# Patient Record
Sex: Male | Born: 1970
Health system: Southern US, Community
[De-identification: ages and names within clinical notes are randomized; demographics above are authoritative.]

## PROBLEM LIST (undated history)

## (undated) DIAGNOSIS — N182 Chronic kidney disease, stage 2 (mild): Secondary | ICD-10-CM

## (undated) DIAGNOSIS — G4733 Obstructive sleep apnea (adult) (pediatric): Secondary | ICD-10-CM

## (undated) DIAGNOSIS — K219 Gastro-esophageal reflux disease without esophagitis: Secondary | ICD-10-CM

## (undated) DIAGNOSIS — M1990 Unspecified osteoarthritis, unspecified site: Secondary | ICD-10-CM

## (undated) DIAGNOSIS — G4730 Sleep apnea, unspecified: Secondary | ICD-10-CM

## (undated) DIAGNOSIS — I517 Cardiomegaly: Secondary | ICD-10-CM

## (undated) DIAGNOSIS — N529 Male erectile dysfunction, unspecified: Secondary | ICD-10-CM

## (undated) DIAGNOSIS — N289 Disorder of kidney and ureter, unspecified: Secondary | ICD-10-CM

## (undated) DIAGNOSIS — E119 Type 2 diabetes mellitus without complications: Secondary | ICD-10-CM

## (undated) DIAGNOSIS — I10 Essential (primary) hypertension: Secondary | ICD-10-CM

## (undated) DIAGNOSIS — R7303 Prediabetes: Secondary | ICD-10-CM

## (undated) DIAGNOSIS — IMO0002 Reserved for concepts with insufficient information to code with codable children: Secondary | ICD-10-CM

## (undated) HISTORY — DX: Reserved for concepts with insufficient information to code with codable children: IMO0002

## (undated) HISTORY — DX: Unspecified osteoarthritis, unspecified site: M19.90

## (undated) HISTORY — DX: Gastro-esophageal reflux disease without esophagitis: K21.9

## (undated) HISTORY — DX: Prediabetes: R73.03

## (undated) HISTORY — DX: Essential (primary) hypertension: I10

## (undated) HISTORY — DX: Chronic kidney disease, stage 2 (mild): N18.2

## (undated) HISTORY — DX: Disorder of kidney and ureter, unspecified: N28.9

## (undated) HISTORY — DX: Cardiomegaly: I51.7

## (undated) HISTORY — DX: Obstructive sleep apnea (adult) (pediatric): G47.33

## (undated) HISTORY — DX: Type 2 diabetes mellitus without complications: E11.9

## (undated) HISTORY — DX: Sleep apnea, unspecified: G47.30

## (undated) HISTORY — PX: OTHER SURGICAL HISTORY: SHX169

## (undated) HISTORY — DX: Male erectile dysfunction, unspecified: N52.9

---

## 1998-07-05 ENCOUNTER — Emergency Department (HOSPITAL_COMMUNITY): Admission: EM | Admit: 1998-07-05 | Discharge: 1998-07-06 | Payer: Self-pay | Admitting: Emergency Medicine

## 1998-09-01 ENCOUNTER — Encounter: Payer: Self-pay | Admitting: Emergency Medicine

## 1998-09-01 ENCOUNTER — Observation Stay (HOSPITAL_COMMUNITY): Admission: EM | Admit: 1998-09-01 | Discharge: 1998-09-02 | Payer: Self-pay | Admitting: Emergency Medicine

## 1998-11-23 ENCOUNTER — Encounter: Payer: Self-pay | Admitting: Internal Medicine

## 1998-11-23 ENCOUNTER — Emergency Department (HOSPITAL_COMMUNITY): Admission: EM | Admit: 1998-11-23 | Discharge: 1998-11-23 | Payer: Self-pay | Admitting: Emergency Medicine

## 1999-03-06 ENCOUNTER — Encounter: Admission: RE | Admit: 1999-03-06 | Discharge: 1999-03-06 | Payer: Self-pay | Admitting: Family Medicine

## 1999-03-06 ENCOUNTER — Encounter: Payer: Self-pay | Admitting: Family Medicine

## 2001-06-04 ENCOUNTER — Emergency Department (HOSPITAL_COMMUNITY): Admission: EM | Admit: 2001-06-04 | Discharge: 2001-06-05 | Payer: Self-pay | Admitting: Emergency Medicine

## 2001-06-04 ENCOUNTER — Encounter: Payer: Self-pay | Admitting: Emergency Medicine

## 2003-01-09 ENCOUNTER — Emergency Department (HOSPITAL_COMMUNITY): Admission: EM | Admit: 2003-01-09 | Discharge: 2003-01-09 | Payer: Self-pay | Admitting: *Deleted

## 2004-04-28 ENCOUNTER — Emergency Department (HOSPITAL_COMMUNITY): Admission: EM | Admit: 2004-04-28 | Discharge: 2004-04-28 | Payer: Self-pay | Admitting: Emergency Medicine

## 2005-02-26 ENCOUNTER — Emergency Department (HOSPITAL_COMMUNITY): Admission: EM | Admit: 2005-02-26 | Discharge: 2005-02-26 | Payer: Self-pay | Admitting: Emergency Medicine

## 2005-04-18 ENCOUNTER — Emergency Department (HOSPITAL_COMMUNITY): Admission: EM | Admit: 2005-04-18 | Discharge: 2005-04-19 | Payer: Self-pay | Admitting: Emergency Medicine

## 2005-06-07 ENCOUNTER — Emergency Department (HOSPITAL_COMMUNITY): Admission: EM | Admit: 2005-06-07 | Discharge: 2005-06-07 | Payer: Self-pay | Admitting: Family Medicine

## 2005-06-13 ENCOUNTER — Emergency Department (HOSPITAL_COMMUNITY): Admission: EM | Admit: 2005-06-13 | Discharge: 2005-06-13 | Payer: Self-pay | Admitting: Emergency Medicine

## 2005-06-25 ENCOUNTER — Emergency Department (HOSPITAL_COMMUNITY): Admission: EM | Admit: 2005-06-25 | Discharge: 2005-06-25 | Payer: Self-pay | Admitting: Emergency Medicine

## 2005-07-03 ENCOUNTER — Ambulatory Visit: Payer: Self-pay | Admitting: *Deleted

## 2005-07-06 ENCOUNTER — Ambulatory Visit: Payer: Self-pay | Admitting: Nurse Practitioner

## 2005-10-11 ENCOUNTER — Emergency Department (HOSPITAL_COMMUNITY): Admission: EM | Admit: 2005-10-11 | Discharge: 2005-10-11 | Payer: Self-pay | Admitting: Family Medicine

## 2005-10-15 ENCOUNTER — Ambulatory Visit: Payer: Self-pay | Admitting: Nurse Practitioner

## 2005-10-22 ENCOUNTER — Ambulatory Visit: Payer: Self-pay | Admitting: Nurse Practitioner

## 2005-11-06 ENCOUNTER — Ambulatory Visit: Payer: Self-pay | Admitting: Nurse Practitioner

## 2005-12-04 ENCOUNTER — Ambulatory Visit: Payer: Self-pay | Admitting: Internal Medicine

## 2006-03-10 ENCOUNTER — Ambulatory Visit: Payer: Self-pay | Admitting: Nurse Practitioner

## 2006-03-28 ENCOUNTER — Emergency Department (HOSPITAL_COMMUNITY): Admission: EM | Admit: 2006-03-28 | Discharge: 2006-03-29 | Payer: Self-pay | Admitting: Emergency Medicine

## 2006-08-20 ENCOUNTER — Emergency Department (HOSPITAL_COMMUNITY): Admission: EM | Admit: 2006-08-20 | Discharge: 2006-08-20 | Payer: Self-pay | Admitting: Family Medicine

## 2006-10-13 ENCOUNTER — Encounter (INDEPENDENT_AMBULATORY_CARE_PROVIDER_SITE_OTHER): Payer: Self-pay | Admitting: *Deleted

## 2006-10-14 ENCOUNTER — Encounter (INDEPENDENT_AMBULATORY_CARE_PROVIDER_SITE_OTHER): Payer: Self-pay | Admitting: Nurse Practitioner

## 2006-10-14 ENCOUNTER — Ambulatory Visit: Payer: Self-pay | Admitting: Internal Medicine

## 2006-10-14 LAB — CONVERTED CEMR LAB
Basophils Absolute: 0 10*3/uL (ref 0.0–0.1)
CO2: 27 meq/L (ref 19–32)
Chloride: 105 meq/L (ref 96–112)
Creatinine, Ser: 1.35 mg/dL (ref 0.40–1.50)
Eosinophils Absolute: 0 10*3/uL (ref 0.0–0.7)
Hemoglobin: 13.6 g/dL (ref 13.0–17.0)
Lymphs Abs: 1.6 10*3/uL (ref 0.7–3.3)
MCHC: 34 g/dL (ref 30.0–36.0)
Monocytes Relative: 7 % (ref 3–11)
Potassium: 4.2 meq/L (ref 3.5–5.3)
RBC: 4.75 M/uL (ref 4.22–5.81)
RDW: 12.4 % (ref 11.5–14.0)
Sodium: 141 meq/L (ref 135–145)
TSH: 1.13 microintl units/mL (ref 0.350–5.50)
WBC: 5 10*3/uL (ref 4.0–10.5)

## 2007-05-20 ENCOUNTER — Emergency Department (HOSPITAL_COMMUNITY): Admission: EM | Admit: 2007-05-20 | Discharge: 2007-05-20 | Payer: Self-pay | Admitting: Family Medicine

## 2007-08-04 ENCOUNTER — Encounter (INDEPENDENT_AMBULATORY_CARE_PROVIDER_SITE_OTHER): Payer: Self-pay | Admitting: Internal Medicine

## 2007-08-04 ENCOUNTER — Inpatient Hospital Stay (HOSPITAL_COMMUNITY): Admission: EM | Admit: 2007-08-04 | Discharge: 2007-08-05 | Payer: Self-pay | Admitting: Emergency Medicine

## 2007-12-05 ENCOUNTER — Emergency Department (HOSPITAL_COMMUNITY): Admission: EM | Admit: 2007-12-05 | Discharge: 2007-12-06 | Payer: Self-pay | Admitting: Emergency Medicine

## 2007-12-06 ENCOUNTER — Emergency Department (HOSPITAL_COMMUNITY): Admission: EM | Admit: 2007-12-06 | Discharge: 2007-12-06 | Payer: Self-pay | Admitting: Emergency Medicine

## 2007-12-08 ENCOUNTER — Ambulatory Visit: Payer: Self-pay | Admitting: Family Medicine

## 2009-05-06 ENCOUNTER — Encounter (HOSPITAL_COMMUNITY): Payer: Self-pay | Admitting: Emergency Medicine

## 2009-05-07 ENCOUNTER — Inpatient Hospital Stay (HOSPITAL_COMMUNITY): Admission: EM | Admit: 2009-05-07 | Discharge: 2009-05-08 | Payer: Self-pay | Admitting: Internal Medicine

## 2009-05-07 HISTORY — PX: TRANSTHORACIC ECHOCARDIOGRAM: SHX275

## 2009-05-08 ENCOUNTER — Encounter (INDEPENDENT_AMBULATORY_CARE_PROVIDER_SITE_OTHER): Payer: Self-pay | Admitting: Internal Medicine

## 2009-05-23 ENCOUNTER — Emergency Department (HOSPITAL_COMMUNITY): Admission: EM | Admit: 2009-05-23 | Discharge: 2009-05-23 | Payer: Self-pay | Admitting: Family Medicine

## 2009-05-23 ENCOUNTER — Emergency Department (HOSPITAL_COMMUNITY): Admission: EM | Admit: 2009-05-23 | Discharge: 2009-05-23 | Payer: Self-pay | Admitting: Emergency Medicine

## 2009-06-06 ENCOUNTER — Emergency Department (HOSPITAL_COMMUNITY): Admission: EM | Admit: 2009-06-06 | Discharge: 2009-06-06 | Payer: Self-pay | Admitting: Emergency Medicine

## 2010-01-19 ENCOUNTER — Emergency Department (HOSPITAL_COMMUNITY)
Admission: EM | Admit: 2010-01-19 | Discharge: 2010-01-20 | Payer: Self-pay | Source: Home / Self Care | Admitting: Emergency Medicine

## 2010-04-07 LAB — POCT I-STAT, CHEM 8
BUN: 15 mg/dL (ref 6–23)
Calcium, Ion: 1.18 mmol/L (ref 1.12–1.32)
Chloride: 102 mEq/L (ref 96–112)
Glucose, Bld: 106 mg/dL — ABNORMAL HIGH (ref 70–99)
HCT: 41 % (ref 39.0–52.0)
Hemoglobin: 13.9 g/dL (ref 13.0–17.0)
Potassium: 4.1 mEq/L (ref 3.5–5.1)
Sodium: 141 mEq/L (ref 135–145)

## 2010-04-16 LAB — BASIC METABOLIC PANEL
BUN: 14 mg/dL (ref 6–23)
CO2: 28 mEq/L (ref 19–32)
Creatinine, Ser: 1.44 mg/dL (ref 0.4–1.5)
GFR calc Af Amer: >60 mL/min (ref >60)
Glucose, Bld: 98 mg/dL (ref 70–99)
Glucose, Bld: 100 mg/dL — ABNORMAL HIGH (ref 70–99)
Potassium: 3.6 mEq/L (ref 3.5–5.1)
Potassium: 3.4 mEq/L — ABNORMAL LOW (ref 3.5–5.1)
Sodium: 139 mEq/L (ref 135–145)

## 2010-04-16 LAB — CK TOTAL AND CKMB (NOT AT ARMC)
CK, MB: 1.3 ng/mL (ref 0.3–4.0)
CK, MB: 2 ng/mL (ref 0.3–4.0)
Relative Index: 0.7 (ref 0.0–2.5)
Total CK: 189 U/L (ref 7–232)

## 2010-04-16 LAB — DIFFERENTIAL
Basophils Relative: 0 % (ref 0–1)
Lymphocytes Relative: 26 % (ref 12–46)
Monocytes Absolute: 0.5 10*3/uL (ref 0.1–1.0)
Monocytes Relative: 9 % (ref 3–12)
Neutrophils Relative %: 64 % (ref 43–77)

## 2010-04-16 LAB — CARDIAC PANEL(CRET KIN+CKTOT+MB+TROPI)
CK, MB: 1.1 ng/mL (ref 0.3–4.0)
Troponin I: 0.02 ng/mL (ref 0.00–0.06)

## 2010-04-16 LAB — LIPID PANEL
LDL Cholesterol: 97 mg/dL (ref 0–99)
Total CHOL/HDL Ratio: 3.9 RATIO
Triglycerides: 74 mg/dL (ref <150)

## 2010-04-16 LAB — CBC
MCV: 86.8 fL (ref 78.0–100.0)
Platelets: 267 10*3/uL (ref 150–400)
RBC: 4.68 MIL/uL (ref 4.22–5.81)

## 2010-04-16 LAB — POCT CARDIAC MARKERS

## 2010-04-16 LAB — T3, FREE: T3, Free: 3 pg/mL (ref 2.3–4.2)

## 2010-04-16 LAB — TSH: TSH: 0.782 u[IU]/mL (ref 0.350–4.500)

## 2010-04-16 LAB — T4, FREE: Free T4: 1.11 ng/dL (ref 0.80–1.80)

## 2010-06-10 NOTE — H&P (Signed)
Nicholas Hughes, CLAIRE NO.:  [id number]   MEDICAL RECORD NO.:  [id number]          PATIENT TYPE:  INP   LOCATION:  4737                         FACILITY:  [facility]   PHYSICIAN:  Herbie Saxon, MDDATE OF BIRTH:  Jun 27, 1970   DATE OF ADMISSION:  08/03/2007  DATE OF DISCHARGE:                              HISTORY & PHYSICAL   PRIMARY CARE PHYSICIAN:  Unassigned.   He is a full code and he has no designated health care power of  attorney.   PRESENTING COMPLAINT:  1. Weakness.  2. Shortness of breath.  3. Palpitations, 2 days.   HISTORY OF PRESENTING COMPLAINT:  This is a 40 year old African American  male with past medical history of hypertension, gastroesophageal reflux  disease, family history of coronary artery disease, was not taking his  blood pressure medications since last 3 weeks.  Was started noticing  shortness of breath on mild exertion associated with palpitations,  extreme weakness, and dizziness since last 2 days.  He denies any  substernal chest pain.  No hemoptysis, no cough, no wheezing, and no  fever.  He does not have any melena or hematemesis.  No syncopal  episode.  The patient does not have hematuria, dysuria, frequency, or  urgency of urination.  No new joint swelling or skin rash.  He denies  frequent heartburn.   PAST MEDICAL HISTORY:  1. Mild renal insufficiency that is followed by nephrologist.  2. Hypertension, 7 years.  3. Acid reflux.   FAMILY HISTORY:  Father had hypertension and coronary artery disease.   SOCIAL HISTORY:  He is married.  Drinks alcohol occasionally.  Denies  any history of tobacco or illicit drug abuse.   REVIEW OF SYSTEMS:  Fourteen-systems are reviewed, pertinent positives  only as the history of presenting complaints.   MEDICATIONS:  1. Hydrochlorothiazide 25 mg daily.  2. Compazine 1 mg at bedtime.  3. Procardia 1 tablet daily.   ALLERGIES:  No known drug allergies.   PHYSICAL EXAMINATION:   GENERAL:  He is a young man not in acute  respiratory distress.  VITAL SIGNS:  Temperature is 98, pulse 67, respiratory rate 16, and  blood pressure 187/122.  Blood pressure on presentation was 224/130.  HEENT:  Pupils are equal, reactive to light and accommodation.  No  pallor or jaundice.  Oropharynx and nasopharynx are clear.  Head is  atraumatic and normocephalic.  NECK:  Supple.  There is no elevated JVD or carotid bruits.  No  thyromegaly.  HEART:  PMI in the fifth intercostal space, anterior to the axillary  line.  There are no rubs, murmurs, or gallops.  CHEST:  Clinically clear.  No rales or rhonchi.  ABDOMEN:  Soft and nontender.  No organomegaly.  Bowel sounds are  normoactive.  Inguinal orifices are patent.  NEUROLOGIC:  He is alert and oriented in time, place, and person.  Cranial nerves are II through XII are intact.  Power is 5 in all limbs.  Deep tendon reflexes 2+ globally.  Peripheral pulses present.  No pedal  edema.   LABORATORY DATA:  Troponin is less than  0.05.  Sodium is 169, potassium  4.3, chloride 102, BUN 15, creatinine 1.4, and glucose is 88.  Hemoglobin is 13.6, hematocrit 40, and D-dimer 0.22.   Chest x-ray shows no acute findings.  Low lung volume.  EKG shows sinus  tachycardia at 53 per minute with left ventricular hypertrophy.   ASSESSMENT:  1. Malignant hypertension, look at the EKG shows acute ST-T changes.  2. Query acute coronary syndrome.  3. Gastroesophageal reflux disease.  4. Query medication compliance.   PLAN:  The patient will be admitted to the PCU.  We will start him on IV  nitroglycerin infusion and IV heparin drip.  Cardiology evaluation in  the morning, if possible cardiac catheterization, if cardiac enzymes  rule in.  Cardiac enzymes are negative, will get an adenosine Myoview  done, positive stress test with aspirin 325 mg daily, Morphine 2 mg IV  q.6 h. p.r.n., metoprolol 50 mg b.i.d., continue hydrochlorothiazide 25  mg daily,  Procardia 60 mg daily, add lisinopril 5 mg daily, Atrovent and  Proventil one unit dose q.6 h. p.r.n. for shortness of breath.   DIET:  Heart-healthy, low cholesterol.   ACTIVITY:  Bedrest.   Oxygen 2-5 L via nasal cannula to keep his sats greater than 90.  Strict  urine input and output  monitoring.  We will obtain his serial cardiac  enzymes and EKG q.8 h. x3.  PT, PTT, INR, BNP, thyroid function test,  and fasting lipid panel was obtained.  Urinalysis, urine toxicology,  Hemoccult, and 2-D echocardiogram.  His illness, medications, treatment  and plan explained to him and he verbalizes his understanding.      Herbie Saxon, MD  Electronically Signed     MIO/MEDQ  D:  08/04/2007  T:  08/04/2007  Job:  [id number]

## 2010-06-10 NOTE — Discharge Summary (Signed)
Nicholas Hughes, TUGWELL NO.:  [id number]   MEDICAL RECORD NO.:  [id number]          PATIENT TYPE:  INP   LOCATION:  4737                         FACILITY:  [facility]   PHYSICIAN:  Isidor Holts, M.D.  DATE OF BIRTH:  05-22-1970   DATE OF ADMISSION:  08/03/2007  DATE OF DISCHARGE:  08/05/2007                               DISCHARGE SUMMARY   PRIMARY MEDICAL DOCTOR:  Gentry Fitz.   DISCHARGE DIAGNOSES:  1. Severe uncontrolled hypertension/hypertensive urgency.  2. Gastroesophageal reflux disease.  3. Mild renal insufficiency.   DISCHARGE MEDICATIONS:  1. Aspirin 81 mg p.o. daily.  2. Lopressor 50 mg p.o. b.i.d.  3. Hydrochlorothiazide 25 mg p.o. daily.  4. Lisinopril 20 mg p.o. daily.  5. Felodipine ER 10 mg p.o. daily.   PROCEDURES:  1. Chest x-ray dated August 03, 2007.  This showed low lung volumes no      acute findings.  2. 2-D echocardiogram dated August 04, 2007.  This showed overall normal      left ventricular systolic function, EF 50% to 55%.  No diagnostic      evidence of left ventricular wall motion abnormalities.  Left      ventricular wall thickness was at the upper limits of normal.      Doppler parameters were consistent with abnormal left ventricular      relaxation.   CONSULTATIONS:  Dr. Lady Deutscher, [facility].   ADMISSION HISTORY:  As in H&P notes of August 03, 2007 dictated by Dr.  Christella Noa.  However, in brief, this is a 40 year old male, with known  history of mild renal insufficiency, hypertension, GERD, presenting with  increased weakness, shortness of breath and palpitations of 2 days  duration.  On initial evaluation in the emergency department he was  found with a markedly elevated BP of 224/130.  He was admitted for  further evaluation, investigation and management.   HOSPITAL COURSE:  1. Severe uncontrolled hypertension/hypertensive urgency.  For details      of this presentation, refer to admission history above.  The  patient was managed with a combination of intravenous infusion of      Nitroglycerin, calcium channel antagonist, beta blocker, and ACE      inhibitor, with satisfactory clinical response.  By August 04, 2007,      we were able to discontinue intravenous Nitroglycerin, and the      patient remained normotensive thereafter.  There had been some      concern of possible acute coronary syndrome.  However, the      patient's cardiac enzymes remained unelevated.  A 12-lead EKG      showed left ventricular hypertrophy.  However, no acute ischemic      findings.  2-D echocardiogram showed no regional wall motion      abnormalities.  Cardiology consultation was kindly provided by Dr.      Lady Deutscher, who made appropriate recommendations, concluded that      the patient has diastolic dysfunction, and has recommended followup      on an outpatient basis in his office.   1. Mild renal insufficiency.  The patient has a known history of mild      renal insufficiency.  At the time of discharge on August 05, 2007,      creatinine was reasonable at 1.50 with a BUN of 21.   1. GERD.  The patient remained asymptomatic from this viewpoint,      during the course of his hospitalization.   DISPOSITION:  On August 05, 2007, the patient was completely asymptomatic  and very keen to be discharged.  Blood pressure had normalized, and as a  matter of fact, was 135/96 mmHg.  The patient was therefore, discharged  accordingly.  Of note, his TSH was normal at 1.456.  His lipid profile  was also normal with a total cholesterol of 145, triglyceride 51, HDL of  48 and LDL 87.  The patient has been reassured accordingly.  He is  recommended to return to regular duties on August 08, 2007.   DIET:  Heart-healthy.   ACTIVITY:  As tolerated.   FOLLOWUP INSTRUCTIONS:  The patient is recommended to follow up with Dr.  Lady Deutscher, [facility], in 4 weeks' time.  Telephone number  [phone].  The patient has been  instructed to call the office to make an  appointment.      Isidor Holts, M.D.  Electronically Signed     CO/MEDQ  D:  08/05/2007  T:  08/05/2007  Job:  [id number]   cc:   Elmore Guise., M.D.

## 2010-06-10 NOTE — Consult Note (Signed)
Nicholas Hughes, Nicholas Hughes NO.:  [id number]   MEDICAL RECORD NO.:  [id number]          PATIENT TYPE:  INP   LOCATION:  2108                         FACILITY:  [facility]   PHYSICIAN:  Elmore Guise., M.D.DATE OF BIRTH:  08/28/1970   DATE OF CONSULTATION:  08/04/2007  DATE OF DISCHARGE:                                 CONSULTATION   INDICATION FOR CONSULT:  Refractory hypertension, abnormal ECG.   HISTORY OF PRESENT ILLNESS:  Nicholas Hughes is a 40 year old African  American male with past medical history of hypertension,  gastroesophageal reflux disease, mild chronic renal insufficiency who  presents with 2-day history of increased dyspnea on exertion and  palpitations.  The patient reports he ran out of his for felodipine and  hydrochlorothiazide while at home.  He was also on clonidine but was  able to take this as instructed.  On arrival to the emergency room, he  was noted to have blood pressure of 235/120.  The patient started on  nitroglycerin drip as well as his p.o. medications.  He currently feels  much better.  His breathing is at baseline.  His blood pressure has  improved to 150/90 with heart rates in the 70s.  He denies any chest  pain.  No presyncope or syncope.  No illegal drug use.  Otherwise he is  very active at home, exercises a couple of times per week by doing  resistance activities such as push-ups and sit-ups.  Denies any problems  with chest pain with exercise or with moderate walking.  He has had no  fever, chills, nausea, vomiting or diarrhea.   REVIEW OF SYSTEMS:  As per HPI or otherwise negative.  He is currently  awaiting transfer to telemetry floor.   HOME MEDICATIONS:  1. Felodipine 10 mg daily.  2. Hydrochlorothiazide 25 mg daily.  3. Clonidine 0.1 mg daily.   MEDICATIONS HERE IN THE HOSPITAL:  1. Nitroglycerin drip (just discontinued).  2. Lopressor 50 mg twice daily.  3. Prinivil 20 mg daily.  4. Procardia 60 mg daily.  5.  Aspirin 325 mg daily.  6. Protonix 40 mg IV daily.  7. Hydrochlorothiazide 25 mg daily.  8. Heparin drip.   ALLERGIES:  None.   FAMILY HISTORY:  Positive for hypertension.   SOCIAL HISTORY:  He is single, has three children.  No tobacco or  alcohol.   PHYSICAL EXAMINATION:  VITAL SIGNS:  Blood pressure is 152/85, heart  rates ranging 60-80 sinus on telemetry monitor.  Saturating 100% on room  air.  He is afebrile with temperature of 98.2.  GENERAL:  He is a very pleasant young-appearing African American male,  alert and oriented x4.  No acute distress.  HEENT:  Normal.  NECK:  Supple.  No lymphadenopathy, 2+ carotids, no JVD and no bruits.  LUNGS:  Clear.  HEART:  Regular with soft S4 noted.  ABDOMEN:  Soft, nontender, nondistended, no rebound or guarding.  EXTREMITIES:  Warm with 2+ pulses and no edema.   LABORATORY DATA:  His lab work was reviewed and showed a BUN and  creatinine of 15 and  1.4, potassium of 4.3.  Negative cardiac markers.  D-dimer was less than 0.22.  UDS was negative.   Chest x-ray showed no acute cardiopulmonary disease.   ECG shows sinus bradycardia rate of 53 per minute with LVH with  repolarization abnormalities.   IMPRESSION AND PLAN:  1. Hypertension, now with significant improvement.  He is off his      nitroglycerin drip.  I would continue his Lopressor, Prinivil,      hydrochlorothiazide and calcium channel blocker as listed above.  2. The patient has had no chest pain consistent with acute coronary      syndrome.  His enzymes are negative.  He can discontinue heparin      and saline lock his IV.  We will review his echo.  3. The patient is currently awaiting transfer to telemetry floor.  At      this time, I feel he can be up and around the room as tolerated.      No further cardiac recommendations.      Elmore Guise., M.D.  Electronically Signed     TWK/MEDQ  D:  08/04/2007  T:  08/04/2007  Job:  [id number]

## 2010-06-30 ENCOUNTER — Other Ambulatory Visit: Payer: Self-pay | Admitting: *Deleted

## 2010-06-30 MED ORDER — TRIAMTERENE-HCTZ 75-50 MG PO TABS: 1.0000 | ORAL_TABLET | Freq: Every day | ORAL | Status: DC

## 2010-06-30 NOTE — Telephone Encounter (Signed)
Fax received from pharmacy. Refill completed. Jodette Maxamillian Tienda RN  

## 2010-07-04 ENCOUNTER — Emergency Department (HOSPITAL_COMMUNITY): Payer: Self-pay

## 2010-07-04 ENCOUNTER — Emergency Department (HOSPITAL_COMMUNITY)
Admission: EM | Admit: 2010-07-04 | Discharge: 2010-07-04 | Disposition: A | Discharge status: Home / Self Care | Payer: No Typology Code available for payment source | Attending: Emergency Medicine | Admitting: Emergency Medicine

## 2010-07-04 DIAGNOSIS — I10 Essential (primary) hypertension: Secondary | ICD-10-CM | POA: Insufficient documentation

## 2010-07-04 DIAGNOSIS — Y92410 Unspecified street and highway as the place of occurrence of the external cause: Secondary | ICD-10-CM | POA: Insufficient documentation

## 2010-07-04 DIAGNOSIS — IMO0002 Reserved for concepts with insufficient information to code with codable children: Secondary | ICD-10-CM | POA: Insufficient documentation

## 2010-07-17 ENCOUNTER — Telehealth: Payer: Self-pay | Admitting: Cardiovascular Disease

## 2010-07-17 MED ORDER — DILTIAZEM HCL ER COATED BEADS 120 MG PO CP24: 120.0000 mg | ORAL_CAPSULE | Freq: Every day | ORAL | Status: DC

## 2010-07-17 NOTE — Telephone Encounter (Signed)
Called in needing a refill of his prescription of his (the medication begins with a D) at [facility] on [address]. I have pulled the chart.

## 2010-07-17 NOTE — Telephone Encounter (Signed)
Yearly app made, 60 days given

## 2010-09-02 ENCOUNTER — Encounter: Payer: Self-pay | Admitting: Cardiovascular Disease

## 2010-09-09 ENCOUNTER — Encounter: Payer: Self-pay | Admitting: Cardiovascular Disease

## 2010-09-10 ENCOUNTER — Ambulatory Visit (INDEPENDENT_AMBULATORY_CARE_PROVIDER_SITE_OTHER): Payer: Self-pay | Admitting: Cardiovascular Disease

## 2010-09-10 ENCOUNTER — Encounter: Payer: Self-pay | Admitting: Cardiovascular Disease

## 2010-09-10 VITALS — BP 136/104 | HR 56 | Ht 71.0 in | Wt 245.4 lb

## 2010-09-10 DIAGNOSIS — I10 Essential (primary) hypertension: Secondary | ICD-10-CM

## 2010-09-10 DIAGNOSIS — I4891 Unspecified atrial fibrillation: Secondary | ICD-10-CM | POA: Insufficient documentation

## 2010-09-10 DIAGNOSIS — I480 Paroxysmal atrial fibrillation: Secondary | ICD-10-CM

## 2010-09-10 MED ORDER — METOPROLOL TARTRATE 50 MG PO TABS: 50.0000 mg | ORAL_TABLET | Freq: Two times a day (BID) | ORAL | Status: DC

## 2010-09-10 MED ORDER — DILTIAZEM HCL ER COATED BEADS 120 MG PO CP24: 120.0000 mg | ORAL_CAPSULE | Freq: Every day | ORAL | Status: DC

## 2010-09-10 MED ORDER — TRIAMTERENE-HCTZ 75-50 MG PO TABS: 0.5000 | ORAL_TABLET | Freq: Every day | ORAL | Status: DC

## 2010-09-10 MED ORDER — LISINOPRIL 40 MG PO TABS: 40.0000 mg | ORAL_TABLET | Freq: Every day | ORAL | Status: DC

## 2010-09-10 NOTE — Assessment & Plan Note (Signed)
He remains in sinus rhythm. He's not had any recurrent palpitations that would make me suspicious of atrial fibrillation.

## 2010-09-10 NOTE — Assessment & Plan Note (Signed)
Nicholas Hughes feels quite well. He admits to eating Maxalt and in fact ate a lot of salt this morning. I've encouraged him to cut his salt intake way back. Also encouraged him to continue his medicines and to try to get more regular exercise.

## 2010-09-10 NOTE — Progress Notes (Signed)
Jamey Whitt Date of Birth  [birth year] [facility] [address]     [address] [place], [place]  [address] [phone]  Fax  [phone]  History of Present Illness:  Nicholas Hughes is a 40 year old gentleman with a history of hypertension and transient atrial fibrillation.  He has no cardiac complaints. He's been eating a little bit of extra salt.  He's not had any episodes of chest pain or shortness breath. He admits to not getting as much exercise as he would like.  Current Outpatient Prescriptions on File Prior to Visit  Medication Sig Dispense Refill  . diltiazem (CARDIZEM CD) 120 MG 24 hr capsule Take 1 capsule (120 mg total) by mouth daily.  30 capsule  1  . lisinopril (PRINIVIL,ZESTRIL) 40 MG tablet Take 40 mg by mouth daily.        . metoprolol (LOPRESSOR) 50 MG tablet Take 50 mg by mouth 2 (two) times daily.        [name] triamterene-hydrochlorothiazide (MAXZIDE) 75-50 MG per tablet Take 1 tablet by mouth daily.  45 tablet  1  . aspirin 325 MG tablet Take 325 mg by mouth daily.          No Known Allergies  Past Medical History  Diagnosis Date  . Hypertension   . GERD (gastroesophageal reflux disease)   . LVH (left ventricular hypertrophy)   . Transient atrial fibrillation or flutter   . ED (erectile dysfunction)   . Renal insufficiency     Past Surgical History  Procedure Date  . Transthoracic echocardiogram 05/07/2009    EF 60-65%    History  Smoking status  . Never Smoker   Smokeless tobacco  . Not on file    History  Alcohol Use No    Family History  Problem Relation Age of Onset  . Hypertension Father   . Coronary artery disease Father     Reviw of Systems:  Reviewed in the HPI.  All other systems are negative.  Physical Exam: BP 136/104  Pulse 56  Ht 5\' 11"  (1.803 m)  Wt 245 lb 6.4 oz (111.313 kg)  BMI 34.23 kg/m2 The patient is alert and oriented x 3.  The mood and affect are normal.   Skin: warm and  dry.  Color is normal.    HEENT:   the sclera are nonicteric.  The mucous membranes are moist.  The carotids are 2+ without bruits.  There is no thyromegaly.  There is no JVD.    Lungs: clear.  The chest wall is non tender.    Heart: regular rate with a normal S1 and S2.  There are no murmurs, gallops, or rubs. The PMI is not displaced.     Abdomen: good bowel sounds.  There is no guarding or rebound.  There is no hepatosplenomegaly or tenderness.  There are no masses.   Extremities:  no clubbing, cyanosis, or edema.  The legs are without rashes.  The distal pulses are intact.   Neuro:  Cranial nerves II - XII are intact.  Motor and sensory functions are intact.    The gait is normal.  ECG: Sinus brady . LVH  Assessment / Plan:

## 2010-09-11 ENCOUNTER — Encounter: Payer: Self-pay | Admitting: Cardiovascular Disease

## 2010-09-15 ENCOUNTER — Telehealth: Payer: Self-pay | Admitting: Cardiovascular Disease

## 2010-09-15 NOTE — Telephone Encounter (Signed)
All meds were ordered prior, pharmacy called and scripts available. Pt informed

## 2010-09-15 NOTE — Telephone Encounter (Signed)
Has a question regarding his prescription refills that he had requested.

## 2010-10-23 LAB — POCT I-STAT, CHEM 8
Calcium, Ion: 1.23
Chloride: 102
HCT: 40
Hemoglobin: 13.6
Potassium: 4.3

## 2010-10-23 LAB — URINALYSIS, MICROSCOPIC ONLY
Bilirubin Urine: NEGATIVE
Glucose, UA: NEGATIVE
Hgb urine dipstick: NEGATIVE
Ketones, ur: NEGATIVE
Leukocytes, UA: NEGATIVE
Nitrite: NEGATIVE
Protein, ur: NEGATIVE
Specific Gravity, Urine: 1.026
Urobilinogen, UA: 1
pH: 7

## 2010-10-23 LAB — POCT CARDIAC MARKERS
CKMB, poc: 1
CKMB, poc: 1.1
CKMB, poc: 1.5
Myoglobin, poc: 228
Myoglobin, poc: 45.9
Myoglobin, poc: 72.4
Myoglobin, poc: >500
Operator id: 277751
Operator id: 277751
Operator id: 277751
Operator id: 277751
Troponin i, poc: <0.05
Troponin i, poc: <0.05
Troponin i, poc: <0.05
Troponin i, poc: <0.05

## 2010-10-23 LAB — LIPID PANEL
Cholesterol: 145
LDL Cholesterol: 87

## 2010-10-23 LAB — CK TOTAL AND CKMB (NOT AT ARMC)
CK, MB: 1.7
Total CK: 264 — ABNORMAL HIGH

## 2010-10-23 LAB — RAPID URINE DRUG SCREEN, HOSP PERFORMED
Benzodiazepines: NOT DETECTED
Cocaine: NOT DETECTED
Tetrahydrocannabinol: NOT DETECTED

## 2010-10-23 LAB — CARDIAC PANEL(CRET KIN+CKTOT+MB+TROPI)
CK, MB: 1.2
Troponin I: <0.01

## 2010-10-23 LAB — CBC
HCT: 41.9
MCHC: 33.6
MCV: 86.1
Platelets: 237
RDW: 12.6

## 2010-10-23 LAB — BASIC METABOLIC PANEL
CO2: 27
GFR calc non Af Amer: 53 — ABNORMAL LOW
Glucose, Bld: 87
Potassium: 3.4 — ABNORMAL LOW
Sodium: 136

## 2010-10-23 LAB — TROPONIN I: Troponin I: <0.01

## 2010-10-23 LAB — HOMOCYSTEINE: Homocysteine: 8.4

## 2010-10-23 LAB — TSH: TSH: 1.456

## 2010-10-23 LAB — D-DIMER, QUANTITATIVE: D-Dimer, Quant: <0.22

## 2010-10-28 LAB — DIFFERENTIAL
Basophils Absolute: 0.1
Basophils Absolute: 0.1
Basophils Relative: 1
Basophils Relative: 1
Eosinophils Absolute: 0.1
Eosinophils Relative: 1
Lymphocytes Relative: 23
Monocytes Absolute: 0.5
Neutro Abs: 4.4
Neutrophils Relative %: 67

## 2010-10-28 LAB — COMPREHENSIVE METABOLIC PANEL
ALT: 31
AST: 35
Albumin: 3.9
Alkaline Phosphatase: 52
Alkaline Phosphatase: 52
BUN: 18
CO2: 30
Chloride: 101
Chloride: 102
Creatinine, Ser: 1.36
Creatinine, Ser: 1.43
GFR calc Af Amer: >60
GFR calc non Af Amer: 59 — ABNORMAL LOW
Glucose, Bld: 95
Potassium: 3.9
Sodium: 139
Total Bilirubin: 0.8
Total Bilirubin: 1
Total Protein: 7.1

## 2010-10-28 LAB — URINALYSIS, ROUTINE W REFLEX MICROSCOPIC
Hgb urine dipstick: NEGATIVE
Hgb urine dipstick: NEGATIVE
Nitrite: NEGATIVE
Protein, ur: NEGATIVE
Specific Gravity, Urine: 1.017
Specific Gravity, Urine: 1.022
Urobilinogen, UA: 1
Urobilinogen, UA: 1

## 2010-10-28 LAB — LIPASE, BLOOD: Lipase: 28

## 2010-10-28 LAB — CBC
HCT: 38.6 — ABNORMAL LOW
Hemoglobin: 13.4
MCV: 85.4
MCV: 86.9
Platelets: 275
RBC: 4.56
RDW: 13
WBC: 6.5

## 2010-11-05 ENCOUNTER — Telehealth: Payer: Self-pay | Admitting: Cardiovascular Disease

## 2010-11-05 NOTE — Telephone Encounter (Signed)
Tried to contact/ no answer

## 2010-11-05 NOTE — Telephone Encounter (Signed)
Voice mail box not set up/ no answer

## 2010-11-05 NOTE — Telephone Encounter (Signed)
PT CALLING TO MAKE AN APPT FOR SWELLING IN HIS TEMPLE, SAID DR Elease Hashimoto AGREED TO BE HIS PCP?

## 2010-11-05 NOTE — Telephone Encounter (Signed)
Tried to make contact, msg box not set up. Dr Elease Hashimoto isn't doing primary care. Will refer to urgent or pcp care.

## 2011-02-04 ENCOUNTER — Other Ambulatory Visit: Payer: Self-pay | Admitting: Cardiovascular Disease

## 2011-02-04 DIAGNOSIS — I10 Essential (primary) hypertension: Secondary | ICD-10-CM

## 2011-02-04 MED ORDER — DILTIAZEM HCL ER COATED BEADS 120 MG PO CP24: 120.0000 mg | ORAL_CAPSULE | Freq: Every day | ORAL | Status: DC

## 2011-03-09 ENCOUNTER — Encounter: Payer: Self-pay | Admitting: Cardiovascular Disease

## 2011-03-09 ENCOUNTER — Ambulatory Visit (INDEPENDENT_AMBULATORY_CARE_PROVIDER_SITE_OTHER): Payer: Self-pay | Admitting: Cardiovascular Disease

## 2011-03-09 VITALS — BP 140/100 | HR 92 | Ht 71.0 in | Wt 260.8 lb

## 2011-03-09 DIAGNOSIS — I10 Essential (primary) hypertension: Secondary | ICD-10-CM

## 2011-03-09 MED ORDER — TRIAMTERENE-HCTZ 75-50 MG PO TABS: 0.5000 | ORAL_TABLET | Freq: Every day | ORAL | Status: DC

## 2011-03-09 MED ORDER — LISINOPRIL 40 MG PO TABS: 40.0000 mg | ORAL_TABLET | Freq: Every day | ORAL | Status: DC

## 2011-03-09 MED ORDER — TADALAFIL 20 MG PO TABS: 20.0000 mg | ORAL_TABLET | Freq: Every day | ORAL | Status: DC | PRN

## 2011-03-09 MED ORDER — DILTIAZEM HCL ER COATED BEADS 120 MG PO CP24: 120.0000 mg | ORAL_CAPSULE | Freq: Every day | ORAL | Status: DC

## 2011-03-09 MED ORDER — METOPROLOL TARTRATE 50 MG PO TABS: 50.0000 mg | ORAL_TABLET | Freq: Two times a day (BID) | ORAL | Status: DC

## 2011-03-09 NOTE — Patient Instructions (Signed)
Your physician wants you to follow-up in: 6 MONTHS You will receive a reminder letter in the mail two months in advance. If you don't receive a letter, please call our office to schedule the follow-up appointment. 

## 2011-03-09 NOTE — Progress Notes (Signed)
    Nicholas Hughes Date of Birth  Feb 04, 1970 [facility]     [place]  [address]    [address]   [address] [place], [place]  [address]    [place], [place]  [address] [phone]  Fax  [phone]  [phone]  Fax [phone]  Problem list: 1. Hypertension 2. Left ventricular 3. Transient atrial fibrillation  History of Present Illness:  Nicholas Hughes is a 41 yo with a hx of HTN.  He has joined a gym and is trying to work out . He has gained some weight since I last saw him.  He is not working currently but is hoping to get back to working at "[employer]  He has gained 10-20 lbs since his last visit.  Current Outpatient Prescriptions on File Prior to Visit  Medication Sig Dispense Refill  . aspirin 325 MG tablet Take 325 mg by mouth daily.        [name] diltiazem (CARDIZEM CD) 120 MG 24 hr capsule Take 1 capsule (120 mg total) by mouth daily.  90 capsule  3  . lisinopril (PRINIVIL,ZESTRIL) 40 MG tablet Take 1 tablet (40 mg total) by mouth daily.  90 tablet  3  . metoprolol (LOPRESSOR) 50 MG tablet Take 1 tablet (50 mg total) by mouth 2 (two) times daily.  180 tablet  3  . triamterene-hydrochlorothiazide (MAXZIDE) 75-50 MG per tablet Take 0.5 tablets by mouth daily.  45 tablet  1    No Known Allergies  Past Medical History  Diagnosis Date  . Hypertension   . GERD (gastroesophageal reflux disease)   . LVH (left ventricular hypertrophy)   . Transient atrial fibrillation or flutter   . ED (erectile dysfunction)   . Renal insufficiency     Past Surgical History  Procedure Date  . Transthoracic echocardiogram 05/07/2009    EF 60-65%    History  Smoking status  . Never Smoker   Smokeless tobacco  . Not on file    History  Alcohol Use No    Family History  Problem Relation Age of Onset  . Hypertension Father   . Coronary artery disease Father     Reviw of Systems:  Reviewed in the HPI.  All other systems are negative.  Physical  Exam: Blood pressure 162/100, pulse 92, height 5\' 11"  (1.803 m), weight 260 lb 12.8 oz (118.298 kg). General: Well developed, well nourished, in no acute distress.  Head: Normocephalic, atraumatic, sclera non-icteric, mucus membranes are moist,   Neck: Supple. Negative for carotid bruits. JVD not elevated.  Lungs: Clear bilaterally to auscultation without wheezes, rales, or rhonchi. Breathing is unlabored.  Heart: RRR with S1 S2. He has a soft systolic murmur.  Abdomen: Soft, non-tender, non-distended with normoactive bowel sounds. No hepatomegaly. No rebound/guarding. No obvious abdominal masses.  Msk:  Strength and tone appear normal for age.  Extremities: No clubbing or cyanosis. No edema.  Distal pedal pulses are 2+ and equal bilaterally.  Neuro: Alert and oriented X 3. Moves all extremities spontaneously.  Psych:  Responds to questions appropriately with a normal affect.  ECG:   Assessment / Plan:

## 2011-03-09 NOTE — Assessment & Plan Note (Addendum)
Nicholas Hughes feels quite well. He's been trying to exercise bit more. He recently joined the gym. He's begun to cut back his salt intake.  I suspect a lot of his hypertension is due to his weight gain. I've encouraged him to continue with a good low-salt low-fat diet. I've encouraged him to exercise on a daily basis in an effort to lose weight. I'll see him back the office in several months for followup visit.

## 2012-01-28 ENCOUNTER — Emergency Department (HOSPITAL_COMMUNITY): Payer: 59

## 2012-01-28 ENCOUNTER — Encounter (HOSPITAL_COMMUNITY): Payer: Self-pay | Admitting: Emergency Medicine

## 2012-01-28 ENCOUNTER — Emergency Department (HOSPITAL_COMMUNITY)
Admission: EM | Admit: 2012-01-28 | Discharge: 2012-01-29 | Disposition: A | Discharge status: Home / Self Care | Payer: 59 | Attending: Emergency Medicine | Admitting: Emergency Medicine

## 2012-01-28 DIAGNOSIS — I10 Essential (primary) hypertension: Secondary | ICD-10-CM | POA: Insufficient documentation

## 2012-01-28 DIAGNOSIS — Z79899 Other long term (current) drug therapy: Secondary | ICD-10-CM | POA: Insufficient documentation

## 2012-01-28 DIAGNOSIS — M7020 Olecranon bursitis, unspecified elbow: Secondary | ICD-10-CM | POA: Insufficient documentation

## 2012-01-28 DIAGNOSIS — Z8679 Personal history of other diseases of the circulatory system: Secondary | ICD-10-CM | POA: Insufficient documentation

## 2012-01-28 DIAGNOSIS — Z7982 Long term (current) use of aspirin: Secondary | ICD-10-CM | POA: Insufficient documentation

## 2012-01-28 DIAGNOSIS — M7021 Olecranon bursitis, right elbow: Secondary | ICD-10-CM

## 2012-01-28 DIAGNOSIS — Z87448 Personal history of other diseases of urinary system: Secondary | ICD-10-CM | POA: Insufficient documentation

## 2012-01-28 DIAGNOSIS — Z8719 Personal history of other diseases of the digestive system: Secondary | ICD-10-CM | POA: Insufficient documentation

## 2012-01-28 LAB — CBC WITH DIFFERENTIAL/PLATELET
Basophils Absolute: 0 10*3/uL (ref 0.0–0.1)
Eosinophils Relative: 1 % (ref 0–5)
Lymphocytes Relative: 24 % (ref 12–46)
Lymphs Abs: 1.3 10*3/uL (ref 0.7–4.0)
Neutro Abs: 3.5 10*3/uL (ref 1.7–7.7)
Neutrophils Relative %: 64 % (ref 43–77)
Platelets: 239 10*3/uL (ref 150–400)
RBC: 5.17 MIL/uL (ref 4.22–5.81)
RDW: 12.2 % (ref 11.5–15.5)
WBC: 5.4 10*3/uL (ref 4.0–10.5)

## 2012-01-28 NOTE — ED Notes (Addendum)
Patient complaining of swelling in his right elbow; reports that he was at work when swelling occurred, but denies injury.  Denies pain and stiffness in area.  Right elbow notably more swollen than left; feels like a "pocket of fluid".

## 2012-01-29 LAB — COMPREHENSIVE METABOLIC PANEL
ALT: 34 U/L (ref 0–53)
AST: 34 U/L (ref 0–37)
Alkaline Phosphatase: 65 U/L (ref 39–117)
CO2: 30 mEq/L (ref 19–32)
Calcium: 9.8 mg/dL (ref 8.4–10.5)
GFR calc Af Amer: 63 mL/min — ABNORMAL LOW (ref >90)
GFR calc non Af Amer: 54 mL/min — ABNORMAL LOW (ref >90)
Glucose, Bld: 94 mg/dL (ref 70–99)
Potassium: 3.9 mEq/L (ref 3.5–5.1)
Sodium: 136 mEq/L (ref 135–145)

## 2012-01-29 NOTE — ED Provider Notes (Signed)
History     CSN: [id number]  Arrival date & time 01/28/12  2234   First MD Initiated Contact with Patient 01/28/12 2343      Chief Complaint  Patient presents with  . Joint Swelling   HPI  History provided by the patient. Patient is a 42 year old male with history of hypertension and renal insufficiency who presents with concerns for swelling over right elbow area. Patient first began to notice swelling earlier in the day. He reports very mild discomfort without significant pain. Patient recalls slightly bumping elbow in the shower but denies any other incidents of injury or trauma. He states there was some swelling prior to this injury. He denies any weakness or numbness in the hand. Denies having similar symptoms previously. Denies any IV drug use. Denies any fever, chills or sweats. Patient has not used any treatment for symptoms.  Patient works for [employer]. 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 He he will often rests his right arm on the center console and armrest area.   Past Medical History  Diagnosis Date  . Hypertension   . GERD (gastroesophageal reflux disease)   . LVH (left ventricular hypertrophy)   . Transient atrial fibrillation or flutter   . ED (erectile dysfunction)   . Renal insufficiency     Past Surgical History  Procedure Date  . Transthoracic echocardiogram 05/07/2009    EF 60-65%    Family History  Problem Relation Age of Onset  . Hypertension Father   . Coronary artery disease Father     History  Substance Use Topics  . Smoking status: Never Smoker   . Smokeless tobacco: Not on file  . Alcohol Use: No      Review of Systems  Constitutional: Negative for fever and chills.  Neurological: Negative for weakness and numbness.  All other systems reviewed and are negative.    Allergies  Review of patient's allergies indicates no known allergies.  Home Medications   Current Outpatient Rx  Name  Route  Sig   Dispense  Refill  . ASPIRIN 325 MG PO TABS   Oral   Take 325 mg by mouth daily.           [name] DILTIAZEM HCL ER COATED BEADS 120 MG PO CP24   Oral   Take 1 capsule (120 mg total) by mouth daily.   90 capsule   3   . LISINOPRIL 40 MG PO TABS   Oral   Take 1 tablet (40 mg total) by mouth daily.   90 tablet   3   . METOPROLOL TARTRATE 50 MG PO TABS   Oral   Take 1 tablet (50 mg total) by mouth 2 (two) times daily.   180 tablet   3   . TRIAMTERENE-HCTZ 75-50 MG PO TABS   Oral   Take 0.5 tablets by mouth daily.   45 tablet   3     Needs office visit.     BP 179/101  Pulse 68  Temp 99 F (37.2 C) (Oral)  Resp 18  SpO2 99%  Physical Exam  Nursing note and vitals reviewed. Constitutional: He is oriented to person, place, and time. He appears well-developed and well-nourished. No distress.  HENT:  Head: Normocephalic.  Cardiovascular: Normal rate and regular rhythm.   Pulmonary/Chest: Effort normal and breath sounds normal.  Musculoskeletal: Normal range of motion.       Small amounts of soft fluctuant nodular swelling over the right olecranon  process. No significant tenderness to palpation. Normal full range of motion of elbow. No gross deformity of bony structures. Normal distal pulses, grip strength and sensation in hands and fingers. Skin is normal without erythema or induration.  Neurological: He is alert and oriented to person, place, and time.  Skin: Skin is warm.  Psychiatric: He has a normal mood and affect.    ED Course  Procedures  Results for orders placed during the hospital encounter of 01/28/12  CBC WITH DIFFERENTIAL      Component Value Range   WBC 5.4  4.0 - 10.5 K/uL   RBC 5.17  4.22 - 5.81 MIL/uL   Hemoglobin 15.0  13.0 - 17.0 g/dL   HCT 08.6  57.8 - 46.9 %   MCV 83.6  78.0 - 100.0 fL   MCH 29.0  26.0 - 34.0 pg   MCHC 34.7  30.0 - 36.0 g/dL   RDW 62.9  52.8 - 41.3 %   Platelets 239  150 - 400 K/uL   Neutrophils Relative 64  43 - 77 %    Neutro Abs 3.5  1.7 - 7.7 K/uL   Lymphocytes Relative 24  12 - 46 %   Lymphs Abs 1.3  0.7 - 4.0 K/uL   Monocytes Relative 11  3 - 12 %   Monocytes Absolute 0.6  0.1 - 1.0 K/uL   Eosinophils Relative 1  0 - 5 %   Eosinophils Absolute 0.0  0.0 - 0.7 K/uL   Basophils Relative 0  0 - 1 %   Basophils Absolute 0.0  0.0 - 0.1 K/uL  COMPREHENSIVE METABOLIC PANEL      Component Value Range   Sodium 136  135 - 145 mEq/L   Potassium 3.9  3.5 - 5.1 mEq/L   Chloride 97  96 - 112 mEq/L   CO2 30  19 - 32 mEq/L   Glucose, Bld 94  70 - 99 mg/dL   BUN 17  6 - 23 mg/dL   Creatinine, Ser 2.44 (*) 0.50 - 1.35 mg/dL   Calcium 9.8  8.4 - 01.0 mg/dL   Total Protein 8.4 (*) 6.0 - 8.3 g/dL   Albumin 4.1  3.5 - 5.2 g/dL   AST 34  0 - 37 U/L   ALT 34  0 - 53 U/L   Alkaline Phosphatase 65  39 - 117 U/L   Total Bilirubin 0.3  0.3 - 1.2 mg/dL   GFR calc non Af Amer 54 (*) >90 mL/min   GFR calc Af Amer 63 (*) >90 mL/min       Dg Elbow Complete Right  01/28/2012  *RADIOLOGY REPORT*  Clinical Data: Right elbow pain and swelling, with limited range of motion.  RIGHT ELBOW - COMPLETE 3+ VIEW  Comparison: None.  Findings: There is no evidence of fracture or dislocation.  The visualized joint spaces are preserved.  No significant joint effusion is identified.  Mild soft tissue swelling is suggested overlying the olecranon.  IMPRESSION: No evidence of fracture or dislocation.   Original Report Authenticated By: Tonia Ghent, M.D.      1. Olecranon bursitis of right elbow       MDM  Patient seen and evaluated. Patient well-appearing in no acute distress. Patient has full range of motion of right elbow without significant discomfort or pain. No significant pain with palpation. X-rays unremarkable. Exam findings consistent with olecranon bursitis. Patient instructed to use Rice treatment.        Angus Seller,  PA 01/29/12 0235

## 2012-01-29 NOTE — ED Provider Notes (Signed)
Medical screening examination/treatment/procedure(s) were performed by non-physician practitioner and as supervising physician I was immediately available for consultation/collaboration.  Antonisha Waskey R. Viet Kemmerer, MD 01/29/12 0737 

## 2012-03-24 ENCOUNTER — Other Ambulatory Visit: Payer: Self-pay | Admitting: *Deleted

## 2012-03-24 MED ORDER — DILTIAZEM HCL ER COATED BEADS 120 MG PO CP24: 120.0000 mg | ORAL_CAPSULE | Freq: Every day | ORAL | Status: DC

## 2012-03-24 MED ORDER — LISINOPRIL 40 MG PO TABS: 40.0000 mg | ORAL_TABLET | Freq: Every day | ORAL | Status: DC

## 2012-03-24 MED ORDER — TRIAMTERENE-HCTZ 75-50 MG PO TABS: 0.5000 | ORAL_TABLET | Freq: Every day | ORAL | Status: DC

## 2012-03-24 MED ORDER — METOPROLOL TARTRATE 50 MG PO TABS: 50.0000 mg | ORAL_TABLET | Freq: Two times a day (BID) | ORAL | Status: DC

## 2012-03-30 ENCOUNTER — Ambulatory Visit (INDEPENDENT_AMBULATORY_CARE_PROVIDER_SITE_OTHER): Payer: 59 | Admitting: Physician Assistant

## 2012-03-30 ENCOUNTER — Encounter: Payer: Self-pay | Admitting: Physician Assistant

## 2012-03-30 VITALS — BP 138/82 | HR 57 | Ht 72.0 in | Wt 252.8 lb

## 2012-03-30 MED ORDER — TRIAMTERENE-HCTZ 75-50 MG PO TABS: 0.5000 | ORAL_TABLET | Freq: Every day | ORAL | Status: DC

## 2012-03-30 MED ORDER — LISINOPRIL 40 MG PO TABS: 40.0000 mg | ORAL_TABLET | Freq: Every day | ORAL | Status: DC

## 2012-03-30 MED ORDER — DILTIAZEM HCL ER COATED BEADS 120 MG PO CP24: 120.0000 mg | ORAL_CAPSULE | Freq: Every day | ORAL | Status: DC

## 2012-03-30 MED ORDER — METOPROLOL TARTRATE 50 MG PO TABS: 50.0000 mg | ORAL_TABLET | Freq: Two times a day (BID) | ORAL | Status: DC

## 2012-03-30 NOTE — Patient Instructions (Signed)
Your physician wants you to follow-up in:  YEAR WITH DR Prescott Parma will receive a reminder letter in the mail two months in advance. If you don't receive a letter, please call our office to schedule the follow-up appointment. Your physician recommends that you continue on your current medications as directed. Please refer to the Current Medication list given to you today.

## 2012-03-30 NOTE — Assessment & Plan Note (Signed)
Patient is in normal sinus rhythm. He has had no palpitations.

## 2012-03-30 NOTE — Progress Notes (Signed)
HPI:  This is a very pleasant 42 year old African American male patient of Dr. Melburn Popper who has a history of hypertension and transient atrial fibrillation. He is here for his yearly checkup and medication renewal. He's been doing well and taking his medication regularly. He keeps a close eye on his blood pressure at home and it usually runs between 1:30 140/80-85. He watches his salt closely. He walks on treadmill 15-20 minutes every 3 days. He recently got a new primary care physician but can't remember her name or what practice she is in. He denies any chest pain, palpitations, dyspnea, dyspnea on exertion, dizziness, or presyncope.  No Known Allergies  Current Outpatient Prescriptions on File Prior to Visit: aspirin 325 MG tablet, Take 325 mg by mouth daily.  , Disp: , Rfl:  diltiazem (CARDIZEM CD) 120 MG 24 hr capsule, Take 1 capsule (120 mg total) by mouth daily., Disp: 30 capsule, Rfl: 0 lisinopril (PRINIVIL,ZESTRIL) 40 MG tablet, Take 1 tablet (40 mg total) by mouth daily., Disp: 30 tablet, Rfl: 0 metoprolol (LOPRESSOR) 50 MG tablet, Take 1 tablet (50 mg total) by mouth 2 (two) times daily., Disp: 60 tablet, Rfl: 0 triamterene-hydrochlorothiazide (MAXZIDE) 75-50 MG per tablet, Take 0.5 tablets by mouth daily., Disp: 15 tablet, Rfl: 0  No current facility-administered medications on file prior to visit.   Past Medical History:   Hypertension                                                 GERD (gastroesophageal reflux disease)                       LVH (left ventricular hypertrophy)                           Transient atrial fibrillation or flutter                     ED (erectile dysfunction)                                    Renal insufficiency                                         Past Surgical History:   TRANSTHORACIC ECHOCARDIOGRAM                     05/07/2009      Comment:EF 60-65%  Review of patient's family history indicates:   Hypertension                   Father                    Coronary artery disease        Father                   Social History   Marital Status: Single              Spouse Name:                      Years  of Education:                 Number of children:             Occupational History   None on file  Social History Main Topics   Smoking Status: Never Smoker                     Smokeless Status: Not on file                      Alcohol Use: No             Drug Use: No             Sexual Activity:                    Other Topics            Concern   None on file  Social History Narrative   None on file    ROS:See history of present illness otherwise negative   PHYSICAL EXAM: Well-nournished, in no acute distress. Neck: No JVD, HJR, Bruit, or thyroid enlargement  Lungs: No tachypnea, clear without wheezing, rales, or rhonchi  Cardiovascular: RRR, PMI not displaced, heart sounds distant, no murmurs, gallops, bruit, thrill, or heave.  Abdomen: BS normal. Soft without organomegaly, masses, lesions or tenderness.  Extremities: without cyanosis, clubbing or edema. Good distal pulses bilateral  SKin: Warm, no lesions or rashes   Musculoskeletal: No deformities  Neuro: no focal signs  BP 138/82  Pulse 57  Ht 6' (1.829 m)  Wt 252 lb 12.8 oz (114.669 kg)  BMI 34.28 kg/m2   ZOX:WRUEAV sinus rhythm with LVH and subsequent T wave inversion inferior anterolaterally. EKG unchanged from prior tracing

## 2012-03-30 NOTE — Addendum Note (Signed)
Addended by: Scherrie Bateman E on: 03/30/2012 08:54 AM   Modules accepted: Orders

## 2012-03-30 NOTE — Assessment & Plan Note (Signed)
Blood pressure stable. Renew medications. Recommend increasing his exercise to at least 30 minutes 3 times a week.

## 2013-03-30 ENCOUNTER — Other Ambulatory Visit: Payer: Self-pay

## 2013-03-30 DIAGNOSIS — I10 Essential (primary) hypertension: Secondary | ICD-10-CM

## 2013-03-30 MED ORDER — METOPROLOL TARTRATE 50 MG PO TABS: 50.0000 mg | ORAL_TABLET | Freq: Two times a day (BID) | ORAL | Status: DC

## 2013-03-30 MED ORDER — TRIAMTERENE-HCTZ 75-50 MG PO TABS: 0.5000 | ORAL_TABLET | Freq: Every day | ORAL | Status: DC

## 2013-03-30 MED ORDER — DILTIAZEM HCL ER COATED BEADS 120 MG PO CP24: 120.0000 mg | ORAL_CAPSULE | Freq: Every day | ORAL | Status: DC

## 2013-03-30 MED ORDER — LISINOPRIL 40 MG PO TABS: 40.0000 mg | ORAL_TABLET | Freq: Every day | ORAL | Status: DC

## 2013-04-26 ENCOUNTER — Ambulatory Visit: Payer: 59 | Admitting: Cardiovascular Disease

## 2013-04-27 ENCOUNTER — Other Ambulatory Visit: Payer: Self-pay | Admitting: *Deleted

## 2013-04-27 DIAGNOSIS — I10 Essential (primary) hypertension: Secondary | ICD-10-CM

## 2013-04-27 MED ORDER — LISINOPRIL 40 MG PO TABS: 40.0000 mg | ORAL_TABLET | Freq: Every day | ORAL | Status: DC

## 2013-04-27 MED ORDER — METOPROLOL TARTRATE 50 MG PO TABS: 50.0000 mg | ORAL_TABLET | Freq: Two times a day (BID) | ORAL | Status: DC

## 2013-04-27 MED ORDER — TRIAMTERENE-HCTZ 75-50 MG PO TABS: 0.5000 | ORAL_TABLET | Freq: Every day | ORAL | Status: DC

## 2013-04-27 MED ORDER — DILTIAZEM HCL ER COATED BEADS 120 MG PO CP24: 120.0000 mg | ORAL_CAPSULE | Freq: Every day | ORAL | Status: DC

## 2013-05-10 ENCOUNTER — Ambulatory Visit (INDEPENDENT_AMBULATORY_CARE_PROVIDER_SITE_OTHER): Payer: 59 | Admitting: Cardiovascular Disease

## 2013-05-10 ENCOUNTER — Encounter: Payer: Self-pay | Admitting: Cardiovascular Disease

## 2013-05-10 VITALS — BP 126/104 | HR 57 | Ht 72.0 in | Wt 259.4 lb

## 2013-05-10 DIAGNOSIS — I4891 Unspecified atrial fibrillation: Secondary | ICD-10-CM

## 2013-05-10 DIAGNOSIS — I10 Essential (primary) hypertension: Secondary | ICD-10-CM

## 2013-05-10 DIAGNOSIS — I480 Paroxysmal atrial fibrillation: Secondary | ICD-10-CM

## 2013-05-10 MED ORDER — CARVEDILOL 25 MG PO TABS: 25.0000 mg | ORAL_TABLET | Freq: Two times a day (BID) | ORAL | Status: DC

## 2013-05-10 NOTE — Patient Instructions (Addendum)
Your physician has recommended you make the following change in your medication:  STOP Metoprolol (Lopressor) 50 mg START Carvedilol 25 mg twice daily  Your physician wants you to follow-up in: 6 months with Dr. Elease HashimotoNahser.  You will receive a reminder letter in the mail two months in advance. If you don't receive a letter, please call our office to schedule the follow-up appointment.

## 2013-05-10 NOTE — Assessment & Plan Note (Signed)
Nicholas Hughes is doing fine . He's exercising fairly regularly - 15 minutes a day - 3-4 times a week.  Encouraged him to increase his exercise a little bit. He's got sounds fairly well-controlled. We will have him continue to watch his salt intake.  I would like to stop the metoprolol to start him on carvedilol 25 mg twice a day. This may help improve his blood pressure control. I'll see him back in 6 months for followup visit

## 2013-05-10 NOTE — Progress Notes (Signed)
Nicholas Hughes Date of Birth  1970-12-24 [facility]     [place] [facility]  [address]    [address]   [address] [place], [place]  [address]    [place], [place]  [id number] [phone]  Fax  [phone]  [phone]  Fax [phone]  Problem list: 1. Hypertension 2. Left ventricular Hypertrophy 3. Transient atrial fibrillation  History of Present Illness:  Nicholas PontesDellwood is a 43 yo with a hx of HTN.  He has joined a gym and is trying to work out . He has gained some weight since I last saw him.  He is not working currently but is hoping to get back to working at "[employer]  He has gained 10-20 lbs since his last visit.  Nicholas Hughes, Nicholas Hughes:  Nicholas Hughes is doing ok.  No Cp no dyspnea.  BP at home is typically ok.  130 / 85-89 is typical.  Avoiding salt, eats some fast foods.   He is working at [employer].   Current Outpatient Prescriptions on File Prior to Visit  Medication Sig Dispense Refill  . aspirin 325 MG tablet Take 325 mg by mouth daily.        [name]. diltiazem (CARDIZEM CD) 120 MG 24 hr capsule Take 1 capsule (120 mg total) by mouth daily.  Hughes capsule  0  . lisinopril (PRINIVIL,ZESTRIL) 40 MG tablet Take 1 tablet (40 mg total) by mouth daily.  Hughes tablet  0  . metoprolol (LOPRESSOR) 50 MG tablet Take 1 tablet (50 mg total) by mouth 2 (two) times daily.  30 tablet  0  . triamterene-hydrochlorothiazide (MAXZIDE) 75-50 MG per tablet Take 0.5 tablets by mouth daily.  7 tablet  0   No current facility-administered medications on file prior to visit.    No Known Allergies  Past Medical History  Diagnosis Date  . Hypertension   . GERD (gastroesophageal reflux disease)   . LVH (left ventricular hypertrophy)   . Transient atrial fibrillation or flutter   . ED (erectile dysfunction)   . Renal insufficiency     Past Surgical History  Procedure Laterality Date  . Transthoracic echocardiogram  05/07/2009    EF 60-65%    History  Smoking status  .  Never Smoker   Smokeless tobacco  . Not on file    History  Alcohol Use No    Family History  Problem Relation Age of Onset  . Hypertension Father   . Coronary artery disease Father     Reviw of Systems:  Reviewed in the HPI.  All other systems are negative.  Physical Exam: Blood pressure 126/104, pulse 57, height 6' (1.829 m), weight 259 lb 6.4 oz (117.663 kg). General: Well developed, well nourished, in no acute distress. Head: Normocephalic, atraumatic, sclera non-icteric, mucus membranes are moist,  Neck: Supple. Negative for carotid bruits. JVD not elevated. Lungs: Clear bilaterally to auscultation without wheezes, rales, or rhonchi. Breathing is unlabored. Heart: RRR with S1 S2. He has a soft systolic murmur. Abdomen: Soft, non-tender, non-distended with normoactive bowel sounds. No hepatomegaly. No rebound/guarding. No obvious abdominal masses. Msk:  Strength and tone appear normal for age. Extremities: No clubbing or cyanosis. No edema.  Distal pedal pulses are 2+ and equal bilaterally. Neuro: Alert and oriented X 3. Moves all extremities spontaneously. Psych:  Responds to questions appropriately with a normal affect. ECG: Nicholas Hughes, Nicholas Hughes;   SINUS BRADY  At 5257.  Minimal LVH with associated St abn.  Assessment / Plan:

## 2013-05-17 ENCOUNTER — Other Ambulatory Visit: Payer: Self-pay | Admitting: *Deleted

## 2013-05-17 DIAGNOSIS — I10 Essential (primary) hypertension: Secondary | ICD-10-CM

## 2013-05-17 MED ORDER — TRIAMTERENE-HCTZ 75-50 MG PO TABS: 0.5000 | ORAL_TABLET | Freq: Every day | ORAL | Status: DC

## 2013-05-17 MED ORDER — DILTIAZEM HCL ER COATED BEADS 120 MG PO CP24: 120.0000 mg | ORAL_CAPSULE | Freq: Every day | ORAL | Status: DC

## 2013-05-17 MED ORDER — LISINOPRIL 40 MG PO TABS: 40.0000 mg | ORAL_TABLET | Freq: Every day | ORAL | Status: DC

## 2014-01-23 ENCOUNTER — Emergency Department (HOSPITAL_COMMUNITY): Payer: 59

## 2014-01-23 ENCOUNTER — Encounter (HOSPITAL_COMMUNITY): Payer: Self-pay | Admitting: Emergency Medicine

## 2014-01-23 ENCOUNTER — Emergency Department (HOSPITAL_COMMUNITY)
Admission: EM | Admit: 2014-01-23 | Discharge: 2014-01-23 | Disposition: A | Discharge status: Home / Self Care | Payer: 59 | Attending: Emergency Medicine | Admitting: Emergency Medicine

## 2014-01-23 DIAGNOSIS — Z87438 Personal history of other diseases of male genital organs: Secondary | ICD-10-CM | POA: Diagnosis not present

## 2014-01-23 DIAGNOSIS — Z79899 Other long term (current) drug therapy: Secondary | ICD-10-CM | POA: Insufficient documentation

## 2014-01-23 DIAGNOSIS — I10 Essential (primary) hypertension: Secondary | ICD-10-CM | POA: Diagnosis not present

## 2014-01-23 DIAGNOSIS — Y998 Other external cause status: Secondary | ICD-10-CM | POA: Insufficient documentation

## 2014-01-23 DIAGNOSIS — Z87448 Personal history of other diseases of urinary system: Secondary | ICD-10-CM | POA: Insufficient documentation

## 2014-01-23 DIAGNOSIS — S5002XA Contusion of left elbow, initial encounter: Secondary | ICD-10-CM | POA: Diagnosis not present

## 2014-01-23 DIAGNOSIS — W2209XA Striking against other stationary object, initial encounter: Secondary | ICD-10-CM | POA: Diagnosis not present

## 2014-01-23 DIAGNOSIS — Z7982 Long term (current) use of aspirin: Secondary | ICD-10-CM | POA: Diagnosis not present

## 2014-01-23 DIAGNOSIS — Y9389 Activity, other specified: Secondary | ICD-10-CM | POA: Insufficient documentation

## 2014-01-23 DIAGNOSIS — S59902A Unspecified injury of left elbow, initial encounter: Secondary | ICD-10-CM | POA: Diagnosis present

## 2014-01-23 DIAGNOSIS — Z8719 Personal history of other diseases of the digestive system: Secondary | ICD-10-CM | POA: Diagnosis not present

## 2014-01-23 DIAGNOSIS — Y9289 Other specified places as the place of occurrence of the external cause: Secondary | ICD-10-CM | POA: Insufficient documentation

## 2014-01-23 MED ORDER — NAPROXEN 500 MG PO TABS: 500.0000 mg | ORAL_TABLET | Freq: Two times a day (BID) | ORAL | Status: DC

## 2014-01-23 MED ORDER — HYDROCODONE-ACETAMINOPHEN 5-325 MG PO TABS: 1.0000 | ORAL_TABLET | ORAL | Status: DC | PRN

## 2014-01-23 MED ORDER — NAPROXEN 500 MG PO TABS
500.0000 mg | ORAL_TABLET | Freq: Once | ORAL | Status: AC
  Administered 2014-01-23: 500 mg via ORAL
  Filled 2014-01-23: qty 1

## 2014-01-23 NOTE — Discharge Instructions (Signed)
Please follow the directions provided.  Use the resource guide to establish care with a primary care provider for follow-up on your elbow injury.  If symptoms persist, use the orthopedic referral for further management.  Use ice , take the naproxen twice a day and the vicodin for pain not relieved by the vicodin.  Don't hesitate to return for any new, worsening or concerning symptoms.     SEEK IMMEDIATE MEDICAL CARE IF:  You have increased redness, swelling, or pain in your elbow.  Your swelling or pain is not relieved with medicines.  You have swelling of the hand and fingers.  You are unable to move your fingers or wrist.  You begin to lose feeling in your hand or fingers.  Your fingers or hand become cold or blue.   Emergency Department Resource Guide 1) Find a Doctor and Pay Out of Pocket Although you won't have to find out who is covered by your insurance plan, it is a good idea to ask around and get recommendations. You will then need to call the office and see if the doctor you have chosen will accept you as a new patient and what types of options they offer for patients who are self-pay. Some doctors offer discounts or will set up payment plans for their patients who do not have insurance, but you will need to ask so you aren't surprised when you get to your appointment.  2) Contact Your Local Health Department Not all health departments have doctors that can see patients for sick visits, but many do, so it is worth a call to see if yours does. If you don't know where your local health department is, you can check in your phone book. The CDC also has a tool to help you locate your state's health department, and many state websites also have listings of all of their local health departments.  3) Find a Walk-in Clinic If your illness is not likely to be very severe or complicated, you may want to try a walk in clinic. These are popping up all over the country in pharmacies, drugstores, and  shopping centers. They're usually staffed by nurse practitioners or physician assistants that have been trained to treat common illnesses and complaints. They're usually fairly quick and inexpensive. However, if you have serious medical issues or chronic medical problems, these are probably not your best option.  No Primary Care Doctor: - Call Health Connect at  [phone] - they can help you locate a primary care doctor that  accepts your insurance, provides certain services, etc. - Physician Referral Service- [phone]  Chronic Pain Problems: Organization         Address  Phone   Notes  [facility]  [phone]) [phone] Patients need to be referred by their primary care doctor.   Medication Assistance: Organization         Address  Phone   Notes  [facility] [address] [place], [place] [address] [phone]) [phone] --Must be a resident of [place] -- Must have NO insurance coverage whatsoever (no Medicaid/ Medicare, etc.) -- The pt. MUST have a primary care doctor that directs their care regularly and follows them in the community   MedAssist  [phone]   [employer]  [phone]    Agencies that provide inexpensive medical care: Organization         Address  Phone   Notes  [facility]  (  [phone]   [facility]    [phone]   [facility] [address] [place], [place] [address] [phone]   [facility] [address] [place], [place] [phone]   Planned Parenthood    [phone]   [facility]    [phone]   [facility]  [address], [place] Phone:  [phone], Fax:  [phone] Hours of Operation:  9 am - 6 pm, M-F.  Also accepts Medicaid/Medicare and self-pay.  [facility]  [address], [place] Phone: [phone],  Fax: [phone]. Hours of Operation:  8:30 am - 5:30 pm, M-F.  Also accepts Medicaid and self-pay.  [facility] [place] [address], [place] Phone: [phone]   [facility] [address] Natasha Bence [place], [place] [phone], Ext. [phone] Mondays & Thursdays: 7-9 AM.  First 15 patients are seen on a first come, first serve basis.    Medicaid-accepting [facility] Providers:  Organization         Address  Phone   Notes  [facility] [address], [place] [phone] Also accepts self-pay patients.  [facility] [address] Laurell Josephs [place], [place]  [phone]   [facility] [address], [place] [phone]   [facility] [address] [place] [phone]   Renaye Rakers [address], [place]   [phone] Only accepts [place] patients after they have their name applied to their card.   Self-Pay (no insurance) in [facility]:  Organization         Address  Phone   Notes  Sickle Cell Patients, [facility] [address] [place], [place] [phone]   [facility] [address] [place], [place] [phone]   [facility]  [address], [place] [phone]   [facility]/Dr. Osei-Bonsu  [address], [place] or [address], [place] [phone] Phone number for both [place] and [place] locations is the same.  [facility] [address] [place] [phone]   [facility] [address] [place] or [address] [phone]   [facility] [address] [place] [phone], phone; [phone], fax Sees patients 1st and 3rd Saturday of every month.  Must not qualify for public or private  insurance (i.e. Medicaid, Medicare, [place] Health Choice, Veterans' Benefits)  Household income should be no more than 200% of the poverty level The clinic cannot treat you if you are pregnant or think you are pregnant  Sexually transmitted diseases are not treated at the clinic.    Dental Care: Organization         Address  Phone  Notes  [facility] Department of [facility] [address] [place], [place] [phone] Accepts children up to age 21 who are enrolled in [place] or [place] Health Choice; pregnant women with a Medicaid card; and children who have applied for Medicaid or [place] Health Choice, but were declined, whose parents can pay a reduced fee at time of service.  [facility] Department of [employer]  [address], [place] [phone] Accepts children up to age 43 who are enrolled in Medicaid or [place] Health Choice; pregnant women with a Medicaid card; and children who have applied for Medicaid or [place] Health Choice, but were declined, whose parents can pay a reduced fee at time of service.  [facility]  [address], [place] [phone]) [phone] Patients are seen by appointment only. Walk-ins are not accepted. [facility] will see patients 43 years of age and older. Monday - Tuesday (8am-5pm) Most Wednesdays (8:30-5pm) $30 per visit, cash only  [facility]  [address], [facility] [place] [phone]) [phone] Patients are seen by appointment only. Walk-ins are not accepted. [facility] will see patients 43 years of age and older. One Wednesday Evening (Monthly: Volunteer Based).  $30 per visit, cash only  [facility]  [phone]) [phone] for adults; Children under age 214, call Graduate Pediatric Dentistry at [phone]. Children aged 604-14, please call [phone]) [phone] to request a pediatric application.  Dental services are provided in all areas of dental care  including fillings, crowns and bridges, complete and partial dentures, implants, gum treatment, root canals, and extractions. Preventive care is also provided. Treatment is provided to both adults and children. Patients are selected via a lottery and there is often a waiting list.   [facility] [address], [place]  [phone]) [phone] [email or web address]   [facility] [address], [place], [place] [phone], Ext. [phone] Second and Fourth Thursday of each month, opens at 6:30 AM; Clinic ends at 9 AM.  Patients are seen on a first-come first-served basis, and a limited number are seen during each clinic.   [facility]  [address], [place], [place] [phone]) [phone]   Eligibility Requirements You must have lived in [place], [place], or [place] counties for at least the last three months.   You cannot be eligible for state or federal sponsored National Cityhealthcare insurance, including CIGNAVeterans Administration, [place]icaid, or [employer]Medicare.   You generally cannot be eligible for healthcare insurance through your employer.    How to apply: Eligibility screenings are held every Tuesday and Wednesday afternoon from 1:00 pm until 4:00 pm. You do not need an appointment for the interview!  [facility] [address], [place], [place] [phone]   [facility]  [phone]   [facility]  [phone]   [facility]  [phone]    Behavioral Health Resources in the Community: Intensive Outpatient Programs Organization         Address  Phone  Notes  [facility] [address], [place], [place] [phone]   [facility] [address], [place], [place] [phone]   [facility] [address], [place], [place]  [phone]   [facility] [address],  [place], [place] [phone] or [phone]     Substance Abuse Resources Organization         Address  Phone  Notes  [facility]  [phone]   [facility]  [phone]   [facility]  [phone]   [facility]  [phone]   Residential & Outpatient Substance Abuse Program  [phone]   Psychological Services Organization         Address  Phone  Notes  [facility]  [phone]  [facility]  [phone]- [phone]   [facility] [address], [place] [phone] or [phone]    Mobile Crisis Teams Organization         Address  Phone  Notes  [facility], Mobile Crisis Care Unit  [phone]   [facility]  [address] [place], [place] [phone]   Doristine LocksSharon DeEsch [address] [place] [phone]    Self-Help/Support Groups Organization         Address  Phone             Notes  [facility] [place] - variety of support groups  [phone]- [phone] Call for more information  Narcotics Anonymous (NA), [facility] [address], [place]  2 meetings at this location   Statisticianesidential Treatment Programs Organization         Address  Phone  Notes  [facility] [address],    [place]  [phone]   [facility]  [address], [place] [phone], [place], [place] [phone]   [facility] [address], [place] [phone] Admissions: 8am-3pm M-F  [facility] [address]    [place], [place] [phone]   [facility] [address], [place], [place] [phone]   [facility] [address]  [place], [place] [phone]   [facility] - Intensive Outpatient [address], [place], [place] [phone]   [facility] ([facility] [address]  [place], [place] [phone] or [phone]   [facility]  Services (RTS) [address] [place], [place] [phone] Accepts Medicaid  [facility] [address]  [place] [phone] Substance Abuse/Addiction Treatment   [facility]al Health Resources Organization         Address  Phone  Notes  [facility]  [phone]   Angie FavaJulie Brannon, PhD [address], [address] [place], [place]   [phone](336) [phone] or [phone](336) [phone]   [facility]   [address] [place], [place] [phone](336) [phone]   [facility] [address], [place], [place] [phone] Insurance/Medicaid/sponsorship through [facility]  [facility] [address]                                    [place], [place] [phone]) [phone] Therapy/tele-psych/case  [facility] [address].   [place], [place] [phone]) [phone]    Dr. Lolly MustacheArfeen  [phone](336) [phone]   [facility]  [facility] 1) [address], [place] 2) [address], [place] 3)  [address], [place] [phone]) [phone]  [phone]) [phone]   [facility] Child Abuse Hotline [phone]) [phone] or [phone](336) [phone] (After Hours)

## 2014-01-23 NOTE — ED Notes (Signed)
Pt states that he hit his left elbow on the car frame while getting out of the car. Since then the elbow has been tender and swollen. Bruising and swelling noted to the extremity.

## 2014-01-23 NOTE — ED Provider Notes (Signed)
CSN: [id number]     Arrival date & time 01/23/14  [id number] History   This chart was scribed for non-physician practitioner, Harle BattiestElizabeth Talana Slatten, NP working with Elwin MochaBlair Walden, MD, by Abel PrestoKara Demonbreun, ED Scribe. This patient was seen in room WTR7/WTR7 and the patient's care was started at 9:45 PM.     Chief Complaint  Patient presents with  . Elbow Injury    The history is provided by the patient. No language interpreter was used.    HPI Comments: Nicholas Hughes is a 43 y.o. male who presents to the Emergency Department complaining of left elbow pain at 11 AM 5 days ago.  Pt states he hit his elbow on door frame getting out of his car.  Pt notes pain has worsened since onset.  He notes associated bruising and swelling.  Pt denies any other injury.   Past Medical History  Diagnosis Date  . Hypertension   . GERD (gastroesophageal reflux disease)   . LVH (left ventricular hypertrophy)   . Transient atrial fibrillation or flutter   . ED (erectile dysfunction)   . Renal insufficiency    Past Surgical History  Procedure Laterality Date  . Transthoracic echocardiogram  05/07/2009    EF 60-65%   Family History  Problem Relation Age of Onset  . Hypertension Father   . Coronary artery disease Father    History  Substance Use Topics  . Smoking status: Never Smoker   . Smokeless tobacco: Never Used  . Alcohol Use: Yes     Comment: occasionally    Review of Systems  Constitutional: Negative for fever.  Respiratory: Negative for shortness of breath.   Cardiovascular: Negative for chest pain.  Musculoskeletal: Positive for myalgias.  Skin: Negative for rash.      Allergies  Review of patient's allergies indicates no known allergies.  Home Medications   Prior to Admission medications   Medication Sig Start Date End Date Taking? Authorizing Provider  aspirin 325 MG tablet Take 325 mg by mouth daily.      Historical Provider, MD  carvedilol (COREG) 25 MG tablet Take 1 tablet (25 mg  total) by mouth 2 (two) times daily. 05/10/13   Vesta MixerPhilip J Nahser, MD  diltiazem (CARDIZEM CD) 120 MG 24 hr capsule Take 1 capsule (120 mg total) by mouth daily. 05/17/13 05/17/14  Vesta MixerPhilip J Nahser, MD  JUBLIA 10 % SOLN  05/09/13   Historical Provider, MD  lisinopril (PRINIVIL,ZESTRIL) 40 MG tablet Take 1 tablet (40 mg total) by mouth daily. 05/17/13   Vesta MixerPhilip J Nahser, MD  terbinafine (LAMISIL) 250 MG tablet  05/08/13   Historical Provider, MD  triamterene-hydrochlorothiazide (MAXZIDE) 75-50 MG per tablet Take 0.5 tablets by mouth daily. 05/17/13 05/17/14  Deloris PingPhilip J Nahser, MD   BP 170/94 mmHg  Pulse 95  Temp(Src) 98.9 F (37.2 C) (Oral)  Resp 16  SpO2 100% Physical Exam  Constitutional: He is oriented to person, place, and time. He appears well-developed and well-nourished.  HENT:  Head: Normocephalic.  Eyes: Conjunctivae are normal.  Neck: Normal range of motion. Neck supple.  Pulmonary/Chest: Effort normal.  Musculoskeletal: Normal range of motion.       Left elbow: He exhibits swelling.  ecchymosis 4 cm in diameter to the medial aspect near the medial epicondyl  Neurological: He is alert and oriented to person, place, and time.  5/5 grip strength 5/5 strength with extension and flexion  Skin: Skin is warm and dry.  Psychiatric: He has a normal mood and affect.  His behavior is normal.  Nursing note and vitals reviewed.   ED Course  Procedures (including critical care time) DIAGNOSTIC STUDIES: Oxygen Saturation is 100% on room air, normal by my interpretation.    COORDINATION OF CARE: 9:49 PM Discussed treatment plan with patient at beside, the patient agrees with the plan and has no further questions at this time.   Labs Review Labs Reviewed - No data to display  Imaging Review Dg Elbow Complete Left  01/23/2014   CLINICAL DATA:  Left elbow pain/ injury  EXAM: LEFT ELBOW - COMPLETE 3+ VIEW  COMPARISON:  None.  FINDINGS: No fracture or dislocation is seen.  The joint spaces are  preserved.  No displaced elbow joint fat pads to suggest an elbow joint effusion.  Soft tissue swelling overlying the olecranon.  IMPRESSION: No fracture or dislocation is seen.  Soft tissue swelling overlying the olecranon.   Electronically Signed   By: Charline BillsSriyesh  Krishnan M.D.   On: 01/23/2014 20:49     EKG Interpretation None      MDM   Final diagnoses:  Elbow contusion, left, initial encounter   43 yo with injury to the medial aspect of his elbow. Patient X-Ray negative for obvious fracture or dislocation. Pain managed in ED. Pt advised to follow up with orthopedics if symptoms persist for possibility of missed fracture diagnosis. Patient given brace while in ED, conservative therapy recommended and discussed. Patient will be dc home & is agreeable with above plan.   I personally performed the services described in this documentation, which was scribed in my presence. The recorded information has been reviewed and is accurate.  Filed Vitals:   01/23/14 1947 01/23/14 2200  BP: 170/94 117/87  Pulse: 95 75  Temp: 98.9 F (37.2 C) 98.8 F (37.1 C)  TempSrc: Oral Oral  Resp: 16 12  SpO2: 100% 94%   Meds given in ED:  Medications  naproxen (NAPROSYN) tablet 500 mg (500 mg Oral Given 01/23/14 2200)    Discharge Medication List as of 01/23/2014  9:53 PM    START taking these medications   Details  HYDROcodone-acetaminophen (NORCO/VICODIN) 5-325 MG per tablet Take 1-2 tablets by mouth every 4 (four) hours as needed for moderate pain or severe pain., Starting 01/23/2014, Until Discontinued, Print    naproxen (NAPROSYN) 500 MG tablet Take 1 tablet (500 mg total) by mouth 2 (two) times daily., Starting 01/23/2014, Until Discontinued, Print          Harle BattiestElizabeth Kamarii Buren, NP 01/25/14 1709  Elwin MochaBlair Walden, MD 01/28/14 [phone]

## 2014-02-19 ENCOUNTER — Other Ambulatory Visit: Payer: Self-pay | Admitting: Cardiovascular Disease

## 2014-02-28 ENCOUNTER — Other Ambulatory Visit: Payer: Self-pay

## 2014-02-28 MED ORDER — DILTIAZEM HCL ER COATED BEADS 120 MG PO CP24: 120.0000 mg | ORAL_CAPSULE | Freq: Every day | ORAL | Status: DC

## 2014-03-07 ENCOUNTER — Encounter: Payer: Self-pay | Admitting: Cardiovascular Disease

## 2014-03-07 ENCOUNTER — Ambulatory Visit (INDEPENDENT_AMBULATORY_CARE_PROVIDER_SITE_OTHER): Payer: BC Managed Care – PPO | Admitting: Cardiovascular Disease

## 2014-03-07 VITALS — BP 130/100 | HR 68 | Ht 72.0 in | Wt 268.4 lb

## 2014-03-07 DIAGNOSIS — I10 Essential (primary) hypertension: Secondary | ICD-10-CM

## 2014-03-07 DIAGNOSIS — I480 Paroxysmal atrial fibrillation: Secondary | ICD-10-CM

## 2014-03-07 DIAGNOSIS — R29818 Other symptoms and signs involving the nervous system: Secondary | ICD-10-CM

## 2014-03-07 DIAGNOSIS — G4730 Sleep apnea, unspecified: Secondary | ICD-10-CM

## 2014-03-07 LAB — HEPATIC FUNCTION PANEL
ALK PHOS: 59 U/L (ref 39–117)
ALT: 30 U/L (ref 0–53)
AST: 32 U/L (ref 0–37)
Albumin: 4.1 g/dL (ref 3.5–5.2)
Bilirubin, Direct: 0.1 mg/dL (ref 0.0–0.3)
TOTAL PROTEIN: 7.8 g/dL (ref 6.0–8.3)
Total Bilirubin: 0.4 mg/dL (ref 0.2–1.2)

## 2014-03-07 LAB — CBC WITH DIFFERENTIAL/PLATELET
BASOS ABS: 0 10*3/uL (ref 0.0–0.1)
Basophils Relative: 0.4 % (ref 0.0–3.0)
EOS ABS: 0.2 10*3/uL (ref 0.0–0.7)
Eosinophils Relative: 3.1 % (ref 0.0–5.0)
HCT: 41.7 % (ref 39.0–52.0)
Hemoglobin: 14 g/dL (ref 13.0–17.0)
Lymphocytes Relative: 38.7 % (ref 12.0–46.0)
Lymphs Abs: 1.9 10*3/uL (ref 0.7–4.0)
MCHC: 33.6 g/dL (ref 30.0–36.0)
MCV: 84.6 fl (ref 78.0–100.0)
MONOS PCT: 12.1 % — AB (ref 3.0–12.0)
Monocytes Absolute: 0.6 10*3/uL (ref 0.1–1.0)
NEUTROS ABS: 2.3 10*3/uL (ref 1.4–7.7)
NEUTROS PCT: 45.7 % (ref 43.0–77.0)
PLATELETS: 294 10*3/uL (ref 150.0–400.0)
RBC: 4.93 Mil/uL (ref 4.22–5.81)
RDW: 13.3 % (ref 11.5–15.5)
WBC: 4.9 10*3/uL (ref 4.0–10.5)

## 2014-03-07 LAB — BASIC METABOLIC PANEL
BUN: 22 mg/dL (ref 6–23)
CALCIUM: 9.5 mg/dL (ref 8.4–10.5)
CHLORIDE: 102 meq/L (ref 96–112)
CO2: 30 mEq/L (ref 19–32)
Creatinine, Ser: 1.46 mg/dL (ref 0.40–1.50)
GFR: 67.62 mL/min (ref >60.00)
Glucose, Bld: 82 mg/dL (ref 70–99)
Potassium: 3.6 mEq/L (ref 3.5–5.1)
Sodium: 137 mEq/L (ref 135–145)

## 2014-03-07 LAB — LIPID PANEL
CHOL/HDL RATIO: 3
Cholesterol: 158 mg/dL (ref 0–200)
HDL: 45.8 mg/dL (ref >39.00)
LDL Cholesterol: 93 mg/dL (ref 0–99)
NONHDL: 112.2
Triglycerides: 94 mg/dL (ref 0.0–149.0)
VLDL: 18.8 mg/dL (ref 0.0–40.0)

## 2014-03-07 MED ORDER — TRIAMTERENE-HCTZ 75-50 MG PO TABS: 1.0000 | ORAL_TABLET | Freq: Every day | ORAL | Status: DC

## 2014-03-07 NOTE — Progress Notes (Signed)
Cardiology Office Note   Date:  03/07/2014   ID:  Nicholas Hughes, DOB 09-Dec-1970, MRN [id number]  PCP:  Default, Provider, MD  Cardiologist:   Vesta Mixer, MD   Chief Complaint  Patient presents with  . Follow-up    Hypertension   Problem list: 1. Hypertension 2. Left ventricular Hypertrophy 3. Transient atrial fibrillation   History of Present Illness: Nicholas Hughes is a 44 y.o. male who presents for follow up of his HTN. He has not been having any CP or dyspnea. Eating better , does eat chicken wings every other Wednesday.   We performed an echo card gram back in 2011 which showed normal left ventricular systolic function. Mild mitral regurgitation.  No palpitations. Works for [employer] ( now has changed name to [employer] )    Past Medical History  Diagnosis Date  . Hypertension   . GERD (gastroesophageal reflux disease)   . LVH (left ventricular hypertrophy)   . Transient atrial fibrillation or flutter   . ED (erectile dysfunction)   . Renal insufficiency     Past Surgical History  Procedure Laterality Date  . Transthoracic echocardiogram  05/07/2009    EF 60-65%     Current Outpatient Prescriptions  Medication Sig Dispense Refill  . aspirin 325 MG tablet Take 325 mg by mouth daily.      . carvedilol (COREG) 25 MG tablet Take 1 tablet (25 mg total) by mouth 2 (two) times daily. 180 tablet 3  . diltiazem (CARDIZEM CD) 120 MG 24 hr capsule Take 1 capsule (120 mg total) by mouth daily. 90 capsule 0  . HYDROcodone-acetaminophen (NORCO/VICODIN) 5-325 MG per tablet Take 1-2 tablets by mouth every 4 (four) hours as needed for moderate pain or severe pain. 6 tablet 0  . JUBLIA 10 % SOLN     . lisinopril (PRINIVIL,ZESTRIL) 40 MG tablet TAKE ONE TABLET BY MOUTH ONCE DAILY 30 tablet 0  . naproxen (NAPROSYN) 500 MG tablet Take 1 tablet (500 mg total) by mouth 2 (two) times daily. 30 tablet 0  . terbinafine (LAMISIL) 250 MG tablet     .  triamterene-hydrochlorothiazide (MAXZIDE) 75-50 MG per tablet TAKE ONE-HALF TABLET BY MOUTH ONCE DAILY 15 tablet 0   No current facility-administered medications for this visit.    Allergies:   Review of patient's allergies indicates no known allergies.    Social History:  The patient  reports that he has never smoked. He has never used smokeless tobacco. He reports that he drinks alcohol. He reports that he does not use illicit drugs.   Family History:  The patient's family history includes Coronary artery disease in his father; Hypertension in his father.    ROS:  Please see the history of present illness.    Review of Systems: Constitutional:  denies fever, chills, diaphoresis, appetite change and fatigue.  HEENT: denies photophobia, eye pain, redness, hearing loss, ear pain, congestion, sore throat, rhinorrhea, sneezing, neck pain, neck stiffness and tinnitus.  Respiratory: denies SOB, DOE, cough, chest tightness, and wheezing.  Cardiovascular: denies chest pain, palpitations and leg swelling.  Gastrointestinal: denies nausea, vomiting, abdominal pain, diarrhea, constipation, blood in stool.  Genitourinary: denies dysuria, urgency, frequency, hematuria, flank pain and difficulty urinating.  Musculoskeletal: denies  myalgias, back pain, joint swelling, arthralgias and gait problem.   Skin: denies pallor, rash and wound.  Neurological: denies dizziness, seizures, syncope, weakness, light-headedness, numbness and headaches.   Hematological: denies adenopathy, easy bruising, personal or family bleeding history.  Psychiatric/ Behavioral: denies suicidal ideation, mood changes, confusion, nervousness, sleep disturbance and agitation.       All other systems are reviewed and negative.    PHYSICAL EXAM: VS:  BP 130/100 mmHg  Pulse 68  Ht 6' (1.829 m)  Wt 268 lb 6.4 oz (121.745 kg)  BMI 36.39 kg/m2 , BMI Body mass index is 36.39 kg/(m^2). GEN: Well nourished, well developed, in no  acute distress HEENT: normal Neck: no JVD, carotid bruits, or masses Cardiac: RRR; no murmurs, rubs, or gallops,no edema  Respiratory:  clear to auscultation bilaterally, normal work of breathing GI: soft, nontender, nondistended, + BS MS: no deformity or atrophy Skin: warm and dry, no rash Neuro:  Strength and sensation are intact Psych: normal   EKG:  EKG is ordered today. The ekg ordered today demonstrates NSR at 68.  LVH with repol abn.    Recent Labs: No results found for requested labs within last 365 days.    Lipid Panel    Component Value Date/Time   CHOL  05/07/2009 0615    151        ATP III CLASSIFICATION:  <200     mg/dL   Desirable  [id number]  mg/dL   Borderline High  >=914>=240    mg/dL   High          TRIG 74 05/07/2009 0615   HDL 39* 05/07/2009 0615   CHOLHDL 3.9 05/07/2009 0615   VLDL 15 05/07/2009 0615   LDLCALC  05/07/2009 0615    97        Total Cholesterol/HDL:CHD Risk Coronary Heart Disease Risk Table                     Men   Women  1/2 Average Risk   3.4   3.3  Average Risk       5.0   4.4  2 X Average Risk   9.6   7.1  3 X Average Risk  23.4   11.0        Use the calculated Patient Ratio above and the CHD Risk Table to determine the patient's CHD Risk.        ATP III CLASSIFICATION (LDL):  <100     mg/dL   Optimal  [id number]  mg/dL   Near or Above                    Optimal  130-159  mg/dL   Borderline  [id number]  mg/dL   High  >578>190     mg/dL   Very High      Wt Readings from Last 3 Encounters:  03/07/14 268 lb 6.4 oz (121.745 kg)  05/10/13 259 lb 6.4 oz (117.663 kg)  03/30/12 252 lb 12.8 oz (114.669 kg)      Other studies Reviewed: Additional studies/ records that were reviewed today include: . Review of the above records demonstrates:    ASSESSMENT AND PLAN:  1. Hypertension-  [name]  with seems to be doing okay. His diastolic blood pressure is still a little elevated. We will increase his Maxide 75 g/37.5 mg a day. If this  doesn't improve his blood pressure we'll consider changing to HCTZ and Aldactone.  I've encouraged him to work on weight loss program. Advised him to do more cardio workout instead of weight lifting exercises. His diet has improved significant. Continue with his other medications at current dose. He has episodes of apnea at night according to his  girlfriend.  Will get a sleep study  2. Left ventricular Hypertrophy - stable   3. Transient atrial fibrillation- maintaining NSR.  Continue dilt and coreg.   4.   Suspected sleep apnea:  Will get a sleep study.  Encouraged him to work on a diet and exercise program .    Current medicines are reviewed at length with the patient today.  The patient does not have concerns regarding medicines.  The following changes have been made:  Increase Maxzide    Disposition:   FU with me in 3 months     Signed, Nahser, Deloris Ping, MD  03/07/2014 8:57 AM    [facility] [address] [place], [place], [place]  [address] Phone: [phone]; Fax: [phone]

## 2014-03-07 NOTE — Patient Instructions (Signed)
Your physician has recommended you make the following change in your medication:  INCREASE Maxzide to 75/50 mg once daily  Your physician recommends that you have lab work:  TODAY - BMET, CBC, LFT, Cholesterol  Your physician recommends that you return for lab work in: 1 month for [employer]  Your physician has recommended that you have a sleep study. This test records several body functions during sleep, including: brain activity, eye movement, oxygen and carbon dioxide blood levels, heart rate and rhythm, breathing rate and rhythm, the flow of air through your mouth and nose, snoring, body muscle movements, and chest and belly movement.   Your physician recommends that you schedule a follow-up appointment in: 3 months with Dr. Elease HashimotoNahser

## 2014-03-13 ENCOUNTER — Encounter (HOSPITAL_BASED_OUTPATIENT_CLINIC_OR_DEPARTMENT_OTHER): Payer: BC Managed Care – PPO

## 2014-03-25 ENCOUNTER — Other Ambulatory Visit: Payer: Self-pay | Admitting: Cardiovascular Disease

## 2014-04-05 ENCOUNTER — Other Ambulatory Visit: Payer: BC Managed Care – PPO

## 2014-05-16 ENCOUNTER — Ambulatory Visit (HOSPITAL_BASED_OUTPATIENT_CLINIC_OR_DEPARTMENT_OTHER): Payer: BC Managed Care – PPO | Attending: Cardiovascular Disease

## 2014-05-31 ENCOUNTER — Other Ambulatory Visit: Payer: Self-pay | Admitting: Cardiovascular Disease

## 2014-06-01 ENCOUNTER — Other Ambulatory Visit: Payer: Self-pay | Admitting: *Deleted

## 2014-06-01 MED ORDER — DILTIAZEM HCL ER COATED BEADS 120 MG PO CP24: 120.0000 mg | ORAL_CAPSULE | Freq: Every day | ORAL | Status: DC

## 2014-06-11 ENCOUNTER — Ambulatory Visit: Payer: BLUE CROSS/BLUE SHIELD | Admitting: Cardiovascular Disease

## 2014-06-24 ENCOUNTER — Other Ambulatory Visit: Payer: Self-pay | Admitting: Cardiovascular Disease

## 2014-07-02 ENCOUNTER — Ambulatory Visit (HOSPITAL_BASED_OUTPATIENT_CLINIC_OR_DEPARTMENT_OTHER): Payer: BLUE CROSS/BLUE SHIELD | Attending: Cardiovascular Disease | Admitting: Radiology

## 2014-07-02 VITALS — Ht 72.0 in | Wt 247.0 lb

## 2014-07-02 DIAGNOSIS — G4733 Obstructive sleep apnea (adult) (pediatric): Secondary | ICD-10-CM

## 2014-07-02 DIAGNOSIS — R0683 Snoring: Secondary | ICD-10-CM | POA: Diagnosis not present

## 2014-07-02 DIAGNOSIS — R29818 Other symptoms and signs involving the nervous system: Secondary | ICD-10-CM

## 2014-07-02 DIAGNOSIS — R0681 Apnea, not elsewhere classified: Secondary | ICD-10-CM | POA: Diagnosis present

## 2014-07-14 ENCOUNTER — Encounter (HOSPITAL_BASED_OUTPATIENT_CLINIC_OR_DEPARTMENT_OTHER): Payer: Self-pay | Admitting: Radiology

## 2014-07-14 ENCOUNTER — Telehealth: Payer: Self-pay | Admitting: Cardiology

## 2014-07-14 DIAGNOSIS — G4733 Obstructive sleep apnea (adult) (pediatric): Secondary | ICD-10-CM

## 2014-07-14 HISTORY — DX: Obstructive sleep apnea (adult) (pediatric): G47.33

## 2014-07-14 NOTE — Telephone Encounter (Signed)
Please let patient know that they have moderate sleep apnea but did not have time for adequate CPAP titration. Please set up full night CPAP titration in the sleep lab.

## 2014-07-14 NOTE — Sleep Study (Signed)
NAME: Nicholas Hughes DATE OF BIRTH:  05-13-1970 MEDICAL RECORD NUMBER [id number]  LOCATION: [facility]  PHYSICIAN: TURNER,TRACI R  DATE OF STUDY: 07/02/2014  SLEEP STUDY TYPE:Split Night  Nocturnal Polysomnogram with CPAP titration               REFERRING PHYSICIAN: Nahser, Deloris Ping, MD  INDICATION FOR STUDY: witnessed apnea, snoring  EPWORTH SLEEPINESS SCORE: 15 HEIGHT: 6' (182.9 cm)  WEIGHT: 247 lb (112.038 kg)    Body mass index is 33.49 kg/(m^2).  NECK SIZE: 17.5 in.  MEDICATIONS: Reviewed in the chart  SLEEP ARCHITECTURE: During the diagnostic portion of the study, the patient slept for a total of 127 minutes out of a total sleep period time of 139 minutes.  There was no slow wave sleep and 13 minutes of REM sleep.  The onset to sleep latency was 2.5 minutes and the onset to REM sleep latency was normal at 106 minutes.  The sleep efficiency was normal at 89%.  During the CPAP titration, the patient slept for a total of 280 minutes out of a total sleep period time of 300 minutes.  There was no slow wave sleep and 65 minutes of REM sleep.  The onset to sleep latency was 4 minutes and the onset to REM sleep latency was 51 minutes.  The sleep efficiency was normal at 91%.    RESPIRATORY DATA: During the diagnostic portion of the study, there were a total of 8 apneas, of which, 3 were obstructive, 5 were central and 1 was a mixed apnea.  There were 35 hypopneas.  The overall AHI was 20 events per hour consistent with moderate obstructive sleep apnea/hypopnea syndrome.  Most events occurred during NREM sleep with an equal distribution between the supine and nonsupine positions.  There was mild snoring noted.  The patient was started on CPAP at 5cm H2O and this was titrated for respiratory events and snoring to 14cm H2O.  The patient was unable to maintan REM supine sleep for a prolonged period of time without further respiratory events.    OXYGEN DATA: During the  diagnostic portion of the study the average oxygen saturation was 92%.  The lowest oxygen saturation was 78%.  The time spent with oxygen saturations < 88% was 2.5 minutes.  During the CPAP titration, the lowest oxygen saturation was 83% and the average oxygen saturation was 95%.    CARDIAC DATA: The patient maintained sinus bradycardia to NSR with an average heart rate of 59 bpm.  The lowest heart rate was 36 bpm and the fastest heart rate was 109 bpm.    MOVEMENT/PARASOMNIA: There were no periodic limb movements and no REM sleep behavior disorders noted.  IMPRESSION/ RECOMMENDATION:   1.  Moderate obstructive sleep apnea/hyponea syndrome with an AHI of 20 events per hour.   Most events occurred during NREM sleep with an equal distribution between the supine and nonsupine positions.  2.  Mild snoring was noted during the diagnostic portion and resolved with CPAP titration. 3.  Abnormal sleep architecture with no slow wave sleep and reduced REM sleep. 4.  Oxygen desaturations during respiratory events were noted as low as 78% during the diagnostic portion and as low as 83% during the CPAP titration.   5.  Unsuccessful CPAP titration as the patient could not achieve REM supine sleep at optimum pressure due to insufficiency time for CPAP titration.  Recommend repeat full night CPAP titration.   6.  Treatment would also include  careful attention to proper sleep hygiene, weight reduction for elevated BMI, avoidance of sleeping in the supine position and avoidance of alcohol within four hours of bedtime.  Specific treatment decisions should be tailored to each patient based upon the clinical situation and all treatment options should be considered.  The patient should be instructed to avoid driving if sleepy and careful clinical follow up is needed to ensure that the patient's symptoms are improving with therapy and the PAP adherence is supported and measured if prescribed.    Quintella Reichert Diplomate,  American Board of Sleep Medicine  ELECTRONICALLY SIGNED ON:  07/14/2014, 3:35 PM [facility] PH: [phone]   FX: [phone] ACCREDITED BY THE AMERICAN ACADEMY OF SLEEP MEDICINE

## 2014-07-16 ENCOUNTER — Encounter: Payer: Self-pay | Admitting: *Deleted

## 2014-07-16 NOTE — Telephone Encounter (Signed)
Patient is aware of results.  Titration will be scheduled and a letter will be sent to patient to inform him of titration date.

## 2014-07-16 NOTE — Telephone Encounter (Signed)
Titration has been scheduled 10/02/14.   Letter has been sent to patient.

## 2014-07-16 NOTE — Addendum Note (Signed)
Addended by: Arcola Jansky on: 07/16/2014 10:34 AM   Modules accepted: Orders

## 2014-07-27 ENCOUNTER — Encounter: Payer: Self-pay | Admitting: Cardiovascular Disease

## 2014-07-27 ENCOUNTER — Ambulatory Visit (INDEPENDENT_AMBULATORY_CARE_PROVIDER_SITE_OTHER): Payer: BLUE CROSS/BLUE SHIELD | Admitting: Cardiovascular Disease

## 2014-07-27 VITALS — BP 130/72 | HR 78 | Ht 72.0 in | Wt 257.1 lb

## 2014-07-27 DIAGNOSIS — I10 Essential (primary) hypertension: Secondary | ICD-10-CM

## 2014-07-27 MED ORDER — DILTIAZEM HCL ER COATED BEADS 240 MG PO CP24: 240.0000 mg | ORAL_CAPSULE | Freq: Every day | ORAL | Status: DC

## 2014-07-27 NOTE — Progress Notes (Signed)
Cardiology Office Note   Date:  07/27/2014   ID:  Nicholas Hughes, DOB [birth year], MRN [id number]  PCP:  Default, Provider, MD  Cardiologist:   Vesta Mixer, MD   Chief Complaint  Patient presents with  . Follow-up    intermittent atrial fib   Problem list: 1. Hypertension 2. Left ventricular Hypertrophy 3. Transient atrial fibrillation   History of Present Illness: Nicholas Hughes is a 44 y.o. male who presents for follow up of his HTN. He has not been having any CP or dyspnea. Eating better , does eat chicken wings every other Wednesday.   We performed an echo card gram back in 2011 which showed normal left ventricular systolic function. Mild mitral regurgitation.  No palpitations. Works for [employer] ( now has changed name to [employer] )   July 27, 2014:   Doing well . Reduced sex drive   Past Medical History  Diagnosis Date  . Hypertension   . GERD (gastroesophageal reflux disease)   . LVH (left ventricular hypertrophy)   . Transient atrial fibrillation or flutter   . ED (erectile dysfunction)   . Renal insufficiency   . OSA (obstructive sleep apnea) 07/14/2014    Moderate OSA with AHI 20/hr    Past Surgical History  Procedure Laterality Date  . Transthoracic echocardiogram  05/07/2009    EF 60-65%     Current Outpatient Prescriptions  Medication Sig Dispense Refill  . aspirin 325 MG tablet Take 325 mg by mouth daily.      . carvedilol (COREG) 25 MG tablet TAKE ONE TABLET BY MOUTH TWICE DAILY 180 tablet 0  . diltiazem (CARDIZEM CD) 120 MG 24 hr capsule Take 1 capsule (120 mg total) by mouth daily. 90 capsule 1  . HYDROcodone-acetaminophen (NORCO/VICODIN) 5-325 MG per tablet Take 1-2 tablets by mouth every 4 (four) hours as needed for moderate pain or severe pain. 6 tablet 0  . JUBLIA 10 % SOLN     . lisinopril (PRINIVIL,ZESTRIL) 40 MG tablet TAKE ONE TABLET BY MOUTH ONCE DAILY 30 tablet 11  . naproxen (NAPROSYN) 500 MG tablet Take 1  tablet (500 mg total) by mouth 2 (two) times daily. 30 tablet 0  . terbinafine (LAMISIL) 250 MG tablet     . triamterene-hydrochlorothiazide (MAXZIDE) 75-50 MG per tablet Take 1 tablet by mouth daily. 90 tablet 3   No current facility-administered medications for this visit.    Allergies:   Review of patient's allergies indicates no known allergies.    Social History:  The patient  reports that he has never smoked. He has never used smokeless tobacco. He reports that he drinks alcohol. He reports that he does not use illicit drugs.   Family History:  The patient's family history includes Coronary artery disease in his father; Hypertension in his father.    ROS:  Please see the history of present illness.    Review of Systems: Constitutional:  denies fever, chills, diaphoresis, appetite change and fatigue.  HEENT: denies photophobia, eye pain, redness, hearing loss, ear pain, congestion, sore throat, rhinorrhea, sneezing, neck pain, neck stiffness and tinnitus.  Respiratory: denies SOB, DOE, cough, chest tightness, and wheezing.  Cardiovascular: denies chest pain, palpitations and leg swelling.  Gastrointestinal: denies nausea, vomiting, abdominal pain, diarrhea, constipation, blood in stool.  Genitourinary: denies dysuria, urgency, frequency, hematuria, flank pain and difficulty urinating.  Musculoskeletal: denies  myalgias, back pain, joint swelling, arthralgias and gait problem.   Skin: denies pallor, rash and wound.  Neurological: denies dizziness, seizures, syncope, weakness, light-headedness, numbness and headaches.   Hematological: denies adenopathy, easy bruising, personal or family bleeding history.  Psychiatric/ Behavioral: denies suicidal ideation, mood changes, confusion, nervousness, sleep disturbance and agitation.       All other systems are reviewed and negative.    PHYSICAL EXAM: VS:  BP 130/72 mmHg  Pulse 78  Ht 6' (1.829 m)  Wt 116.629 kg (257 lb 1.9 oz)   BMI 34.86 kg/m2  SpO2 98% , BMI Body mass index is 34.86 kg/(m^2). GEN: Well nourished, well developed, in no acute distress HEENT: normal Neck: no JVD, carotid bruits, or masses Cardiac: RRR; no murmurs, rubs, or gallops,no edema  Respiratory:  clear to auscultation bilaterally, normal work of breathing GI: soft, nontender, nondistended, + BS MS: no deformity or atrophy Skin: warm and dry, no rash Neuro:  Strength and sensation are intact Psych: normal   EKG:  EKG is ordered today. The ekg ordered today demonstrates NSR at 68.  LVH with repol abn.    Recent Labs: 03/07/2014: ALT 30; BUN 22; Creatinine, Ser 1.46; Hemoglobin 14.0; Platelets 294.0; Potassium 3.6; Sodium 137    Lipid Panel    Component Value Date/Time   CHOL 158 03/07/2014 0923   TRIG 94.0 03/07/2014 0923   HDL 45.80 03/07/2014 0923   CHOLHDL 3 03/07/2014 0923   VLDL 18.8 03/07/2014 0923   LDLCALC 93 03/07/2014 0923      Wt Readings from Last 3 Encounters:  07/27/14 116.629 kg (257 lb 1.9 oz)  07/02/14 112.038 kg (247 lb)  03/07/14 121.745 kg (268 lb 6.4 oz)      Other studies Reviewed: Additional studies/ records that were reviewed today include: . Review of the above records demonstrates:    ASSESSMENT AND PLAN:  1. Hypertension-  Romyn  with seems to be doing okay.   BP is better. Continue current meds.    2. Left ventricular Hypertrophy - stable   3. Transient atrial fibrillation- maintaining NSR.  Continue dilt and coreg.   4.   Suspected sleep apnea:  Will get a sleep study.  Encouraged him to work on a diet and exercise program .    Current medicines are reviewed at length with the patient today.  The patient does not have concerns regarding medicines.  The following changes have been made:  Increase Maxzide    Disposition:   FU with me in 6 months     Signed, Doria Fern, Deloris PingPhilip J, MD  07/27/2014 8:42 AM    [facility] [address], [place], [place]   [id number] Phone: [phone]; Fax: [phone]

## 2014-07-27 NOTE — Patient Instructions (Signed)
Medication Instructions:  STOP Coreg (Carvedilol)  INCREASE Cardizem to 240 mg once daily  Labwork: None Ordered  Testing/Procedures: None Ordered  Follow-Up: Your physician wants you to follow-up in: 6 months with Dr. Elease HashimotoNahser.  You will receive a reminder letter in the mail two months in advance. If you don't receive a letter, please call our office to schedule the follow-up appointment.   Your physician has requested that you regularly monitor and record your blood pressure readings at home. Please use the same machine at the same time of day to check your readings and call Eligha BridegroomMichelle Swinyer, RN at [phone] to report in 2 weeks.

## 2014-09-07 ENCOUNTER — Encounter (HOSPITAL_COMMUNITY): Payer: Self-pay | Admitting: *Deleted

## 2014-09-07 ENCOUNTER — Emergency Department (HOSPITAL_COMMUNITY)
Admission: EM | Admit: 2014-09-07 | Discharge: 2014-09-07 | Disposition: A | Discharge status: Home / Self Care | Payer: BLUE CROSS/BLUE SHIELD | Attending: Emergency Medicine | Admitting: Emergency Medicine

## 2014-09-07 DIAGNOSIS — Z7982 Long term (current) use of aspirin: Secondary | ICD-10-CM | POA: Insufficient documentation

## 2014-09-07 DIAGNOSIS — Z8669 Personal history of other diseases of the nervous system and sense organs: Secondary | ICD-10-CM | POA: Insufficient documentation

## 2014-09-07 DIAGNOSIS — Z87448 Personal history of other diseases of urinary system: Secondary | ICD-10-CM | POA: Insufficient documentation

## 2014-09-07 DIAGNOSIS — Z79899 Other long term (current) drug therapy: Secondary | ICD-10-CM | POA: Diagnosis not present

## 2014-09-07 DIAGNOSIS — R42 Dizziness and giddiness: Secondary | ICD-10-CM

## 2014-09-07 DIAGNOSIS — I10 Essential (primary) hypertension: Secondary | ICD-10-CM | POA: Insufficient documentation

## 2014-09-07 DIAGNOSIS — Z791 Long term (current) use of non-steroidal anti-inflammatories (NSAID): Secondary | ICD-10-CM | POA: Insufficient documentation

## 2014-09-07 DIAGNOSIS — K219 Gastro-esophageal reflux disease without esophagitis: Secondary | ICD-10-CM | POA: Insufficient documentation

## 2014-09-07 MED ORDER — DILTIAZEM HCL ER COATED BEADS 120 MG PO CP24: 120.0000 mg | ORAL_CAPSULE | Freq: Every day | ORAL | Status: DC

## 2014-09-07 NOTE — ED Notes (Signed)
Pt ambulatory upon d/c in NAD with steady gait.

## 2014-09-07 NOTE — ED Notes (Signed)
Pt admits to x3 days of dizziness and feeling "off balance" when he wakes up - denies HA or blurred vision. Pt ambulatory to triage, states dizziness gets progressively better throughout the day. Pt in no acute distress, A&Ox4.

## 2014-09-07 NOTE — ED Provider Notes (Signed)
CSN: [id number]     Arrival date & time 09/07/14  0446 History   First MD Initiated Contact with Patient 09/07/14 0453     Chief Complaint  Patient presents with  . Dizziness     (Consider location/radiation/quality/duration/timing/severity/associated sxs/prior Treatment) HPI  Nicholas Hughes is a 44 y.o. male with past medical history of hypertension, atrial fibrillation presenting today with dizziness. He states his dizziness is worse in the mornings. He feels off balance. He denies any other neurological symptoms such as muscle weakness, numbness or tingling. He states he's had a recent medication change. He used to be on Coreg and diltiazem for his atrial fibrillation. The Coreg was affecting his sexual drive, he was taken off of Coreg and diltiazem was increased.  He denies any syncope. He's had no chest pain or shortness of breath. He denies ever feeling this way in the past. Patient has no further complaints.  10 Systems reviewed and are negative for acute change except as noted in the HPI.     Past Medical History  Diagnosis Date  . Hypertension   . GERD (gastroesophageal reflux disease)   . LVH (left ventricular hypertrophy)   . Transient atrial fibrillation or flutter   . ED (erectile dysfunction)   . Renal insufficiency   . OSA (obstructive sleep apnea) 07/14/2014    Moderate OSA with AHI 20/hr   Past Surgical History  Procedure Laterality Date  . Transthoracic echocardiogram  05/07/2009    EF 60-65%   Family History  Problem Relation Age of Onset  . Hypertension Father   . Coronary artery disease Father    Social History  Substance Use Topics  . Smoking status: Never Smoker   . Smokeless tobacco: Never Used  . Alcohol Use: Yes     Comment: occasionally    Review of Systems    Allergies  Review of patient's allergies indicates no known allergies.  Home Medications   Prior to Admission medications   Medication Sig Start Date End Date Taking? Authorizing  Provider  aspirin 325 MG tablet Take 325 mg by mouth daily.     Yes Historical Provider, MD  diltiazem (CARDIZEM CD) 240 MG 24 hr capsule Take 1 capsule (240 mg total) by mouth daily. 07/27/14 07/27/15 Yes Vesta Mixer, MD  lisinopril (PRINIVIL,ZESTRIL) 40 MG tablet TAKE ONE TABLET BY MOUTH ONCE DAILY 03/26/14  Yes Vesta Mixer, MD  omeprazole (PRILOSEC OTC) 20 MG tablet Take 20 mg by mouth daily.   Yes Historical Provider, MD  triamterene-hydrochlorothiazide (MAXZIDE) 75-50 MG per tablet Take 1 tablet by mouth daily. 03/07/14  Yes Vesta Mixer, MD  HYDROcodone-acetaminophen (NORCO/VICODIN) 5-325 MG per tablet Take 1-2 tablets by mouth every 4 (four) hours as needed for moderate pain or severe pain. 01/23/14   Harle Battiest, NP  naproxen (NAPROSYN) 500 MG tablet Take 1 tablet (500 mg total) by mouth 2 (two) times daily. 01/23/14   Harle Battiest, NP   BP 153/90 mmHg  Pulse 76  Temp(Src) 98.7 F (37.1 C) (Oral)  Resp 18  Ht 6' (1.829 m)  Wt 245 lb (111.131 kg)  BMI 33.22 kg/m2  SpO2 99% Physical Exam  Constitutional: He is oriented to person, place, and time. Vital signs are normal. He appears well-developed and well-nourished.  Non-toxic appearance. He does not appear ill. No distress.  HENT:  Head: Normocephalic and atraumatic.  Nose: Nose normal.  Mouth/Throat: Oropharynx is clear and moist. No oropharyngeal exudate.  Eyes: Conjunctivae and EOM are  normal. Pupils are equal, round, and reactive to light. No scleral icterus.  Neck: Normal range of motion. Neck supple. No tracheal deviation, no edema, no erythema and normal range of motion present. No thyroid mass and no thyromegaly present.  Cardiovascular: Normal rate, regular rhythm, S1 normal, S2 normal, normal heart sounds, intact distal pulses and normal pulses.  Exam reveals no gallop and no friction rub.   No murmur heard. Pulses:      Radial pulses are 2+ on the right side, and 2+ on the left side.       Dorsalis  pedis pulses are 2+ on the right side, and 2+ on the left side.  Pulmonary/Chest: Effort normal and breath sounds normal. No respiratory distress. He has no wheezes. He has no rhonchi. He has no rales.  Abdominal: Soft. Normal appearance and bowel sounds are normal. He exhibits no distension, no ascites and no mass. There is no hepatosplenomegaly. There is no tenderness. There is no rebound, no guarding and no CVA tenderness.  Musculoskeletal: Normal range of motion. He exhibits no edema or tenderness.  Lymphadenopathy:    He has no cervical adenopathy.  Neurological: He is alert and oriented to person, place, and time. He has normal strength. No cranial nerve deficit or sensory deficit.  Normal strength and sensation in all extremities. Normal cerebellar testing. Normal gait.  Skin: Skin is warm, dry and intact. No petechiae and no rash noted. He is not diaphoretic. No erythema. No pallor.  Psychiatric: He has a normal mood and affect. His behavior is normal. Judgment normal.  Nursing note and vitals reviewed.   ED Course  Procedures (including critical care time) Labs Review Labs Reviewed - No data to display  Imaging Review No results found. I, Taeden Geller, personally reviewed and evaluated these images and lab results as part of my medical decision-making.   EKG Interpretation None      MDM   Final diagnoses:  None   patient presents to the emergency department for dizziness worse in the mornings. He states he takes his medications every morning. I believe the increase in diltiazem is causing his symptoms. He used to be on 120 mg, I will write him a prescription for this. He was given precautions regarding his atrial fibrillation coming back. He states he is not concerned about this because it has only happened one time and he has not gotten back into that rhythm for years.  He was advised to see his cardiologist within 3 days for close follow-up. He otherwise appears well and in  no acute distress. His vital signs remain within his normal limits and he is safe for discharge.    Tomasita Crumble, MD 09/07/14 [phone]

## 2014-09-07 NOTE — Discharge Instructions (Signed)
Dizziness Nicholas Hughes, your medication may be causing your dizziness. Takes diltiazem 120 mg daily. Do not take the 240 mg dosage. See your cardiologist within 3 days for close follow-up. If symptoms worsen come back to emergency department immediately. Thank you.  Dizziness means you feel unsteady or lightheaded. You might feel like you are going to pass out (faint). HOME CARE   Drink enough fluids to keep your pee (urine) clear or pale yellow.  Take your medicines exactly as told by your doctor. If you take blood pressure medicine, always stand up slowly from the lying or sitting position. Hold on to something to steady yourself.  If you need to stand in one place for a long time, move your legs often. Tighten and relax your leg muscles.  Have someone stay with you until you feel okay.  Do not drive or use heavy machinery if you feel dizzy.  Do not drink alcohol. GET HELP RIGHT AWAY IF:   You feel dizzy or lightheaded and it gets worse.  You feel sick to your stomach (nauseous), or you throw up (vomit).  You have trouble talking or walking.  You feel weak or have trouble using your arms, hands, or legs.  You cannot think clearly or have trouble forming sentences.  You have chest pain, belly (abdominal) pain, sweating, or you are short of breath.  Your vision changes.  You are bleeding.  You have problems from your medicine that seem to be getting worse. MAKE SURE YOU:   Understand these instructions.  Will watch your condition.  Will get help right away if you are not doing well or get worse. Document Released: 01/01/2011 Document Revised: 04/06/2011 Document Reviewed: 01/01/2011 [facility] Patient Information 2015 [place], [place]. This information is not intended to replace advice given to you by your health care provider. Make sure you discuss any questions you have with your health care provider.

## 2014-10-02 ENCOUNTER — Ambulatory Visit (HOSPITAL_BASED_OUTPATIENT_CLINIC_OR_DEPARTMENT_OTHER): Payer: BLUE CROSS/BLUE SHIELD | Attending: Cardiology | Admitting: Radiology

## 2014-10-02 DIAGNOSIS — I493 Ventricular premature depolarization: Secondary | ICD-10-CM | POA: Insufficient documentation

## 2014-10-02 DIAGNOSIS — G4733 Obstructive sleep apnea (adult) (pediatric): Secondary | ICD-10-CM | POA: Diagnosis not present

## 2014-10-02 DIAGNOSIS — R0683 Snoring: Secondary | ICD-10-CM | POA: Insufficient documentation

## 2014-10-02 DIAGNOSIS — G4730 Sleep apnea, unspecified: Secondary | ICD-10-CM | POA: Diagnosis present

## 2014-10-05 ENCOUNTER — Other Ambulatory Visit: Payer: Self-pay

## 2014-10-05 MED ORDER — DILTIAZEM HCL ER COATED BEADS 120 MG PO CP24: 120.0000 mg | ORAL_CAPSULE | Freq: Every day | ORAL | Status: DC

## 2014-10-05 NOTE — Telephone Encounter (Signed)
Per Marcelino Duster RN 30 day supply with no refills of cardizem 120 MG is ok to send in. I called patient back and told him I can send in 30 day supply of cardizem 120 to [facility] pharmacy [address] He also stated understanding that this would be done as long as he made an appointment to see Dr Elease Hashimoto. He agreed to 10/10/2014 3:15 appointment.

## 2014-10-06 ENCOUNTER — Telehealth: Payer: Self-pay | Admitting: Cardiology

## 2014-10-06 NOTE — Telephone Encounter (Signed)
Pt had successful PAP titration. Please setup appointment in 10 weeks. Please let [facility] know that order for PAP is in EPIC.   

## 2014-10-06 NOTE — Progress Notes (Signed)
   Patient Name: Nicholas Hughes, Nicholas Hughes MRN: [id number] Study Date: 10/02/2014 Gender: Male D.O.B: 02/02/1970 Age (years): 35 Referring Provider: Armanda Magic MD, ABSM Interpreting Physician: Armanda Magic MD, ABSM RPSGT: Ulyess Mort  Weight (lbs): 253 Height (inches): 72 BMI: 34 Neck Size: 17.00  CLINICAL INFORMATION The patient is referred for a CPAP titration to treat sleep apnea.  Date of NPSG, Split Night or HST:07/14/2014  SLEEP STUDY TECHNIQUE As per the AASM Manual for the Scoring of Sleep and Associated Events v2.3 (April 2016) with a hypopnea requiring 4% desaturations.  The channels recorded and monitored were frontal, central and occipital EEG, electrooculogram (EOG), submentalis EMG (chin), nasal and oral airflow, thoracic and abdominal wall motion, anterior tibialis EMG, snore microphone, electrocardiogram, and pulse oximetry. Continuous positive airway pressure (CPAP) was initiated at the beginning of the study and titrated to treat sleep-disordered breathing.  MEDICATIONS Medications taken by the patient : ASA, Cardizem, Lisinopril, Omeprazole, Maxide Medications administered by patient during sleep study : No sleep medicine administered.  TECHNICIAN COMMENTS Comments added by technician: Patient was ordered as a Cpap titration. No medications were reported taking see attached sheets for a current list of medications.  Comments added by scorer: N/A  RESPIRATORY PARAMETERS Optimal PAP Pressure (cm): 16  AHI at Optimal Pressure (/hr):2.9 Overall Minimal O2 (%):90.00   Supine % at Optimal Pressure (%):100 Minimal O2 at Optimal Pressure (%):96.0    SLEEP ARCHITECTURE The study was initiated at 10:57:13 PM and ended at 5:35:02 AM.  Sleep onset time was 1.4 minutes and the sleep efficiency was 89.9%. The total sleep time was 357.5 minutes.  The patient spent 7.27% of the night in stage N1 sleep, 74.69% in stage N2 sleep, 0.42% in stage N3 and 17.62% in REM.Stage  REM latency was 151.0 minutes  Wake after sleep onset was 38.9. Alpha intrusion was absent. Supine sleep was 60.75%.  CARDIAC DATA The 2 lead EKG demonstrated sinus rhythm. The mean heart rate was 58.03 beats per minute. Other EKG findings include: PVCs.  LEG MOVEMENT DATA The total Periodic Limb Movements of Sleep (PLMS) were 0. The PLMS index was 0.00. A PLMS index of <15 is considered normal in adults.  IMPRESSIONS The optimal PAP pressure was 16 cm of water. Central sleep apnea was not noted during this titration (CAI = 0.7/h). Significant oxygen desaturations were not observed during this titration (min O2 = 90.00%). The patient snored with Moderate snoring volume during this titration study. 2-lead EKG demonstrated: PVCs Clinically significant periodic limb movements were not noted during this study. Arousals associated with PLMs were rare.  DIAGNOSIS Obstructive Sleep Apnea (327.23 [G47.33 ICD-10])  RECOMMENDATIONS Trial of CPAP therapy on 16 cm H2O with a Medium size Fisher&Paykel Full Face Mask Simplus mask and heated humidification. Avoid alcohol, sedatives and other CNS depressants that may worsen sleep apnea and disrupt normal sleep architecture. Sleep hygiene should be reviewed to assess factors that may improve sleep quality. Weight management and regular exercise should be initiated or continued. Return to [facility] for re-evaluation after 10 weeks of therapy and CPAP download in 4 weeks to assess compliance.   Quintella Reichert Diplomate, American Board of Sleep Medicine  ELECTRONICALLY SIGNED ON:  10/06/2014, 3:07 PM [facility] PH: [phone]   FX: [phone] ACCREDITED BY THE AMERICAN ACADEMY OF SLEEP MEDICINE

## 2014-10-06 NOTE — Addendum Note (Signed)
Addended by: Armanda Magic R on: 10/06/2014 03:14 PM   Modules accepted: Orders

## 2014-10-08 NOTE — Telephone Encounter (Signed)
Patient is aware of results. Stated verbal understanding 

## 2014-10-10 ENCOUNTER — Encounter: Payer: Self-pay | Admitting: Cardiovascular Disease

## 2014-10-10 ENCOUNTER — Ambulatory Visit (INDEPENDENT_AMBULATORY_CARE_PROVIDER_SITE_OTHER): Payer: Managed Care, Other (non HMO) | Admitting: Cardiovascular Disease

## 2014-10-10 VITALS — BP 140/96 | HR 62 | Ht 72.0 in | Wt 258.1 lb

## 2014-10-10 DIAGNOSIS — Z13220 Encounter for screening for lipoid disorders: Secondary | ICD-10-CM

## 2014-10-10 MED ORDER — SILDENAFIL CITRATE 20 MG PO TABS: 20.0000 mg | ORAL_TABLET | Freq: Three times a day (TID) | ORAL | Status: DC | PRN

## 2014-10-10 MED ORDER — DILTIAZEM HCL ER COATED BEADS 120 MG PO CP24: 120.0000 mg | ORAL_CAPSULE | Freq: Every day | ORAL | Status: DC

## 2014-10-10 NOTE — Progress Notes (Signed)
Cardiology Office Note   Date:  10/10/2014   ID:  Nicholas Hughes, DOB 02/07/1970, MRN [id number]  PCP:  No PCP Per Patient  Cardiologist:   Vesta Mixer, MD   Chief Complaint  Patient presents with  . Follow-up   Problem list: 1. Hypertension 2. Left ventricular Hypertrophy 3. Transient atrial fibrillation   History of Present Illness: Nicholas Hughes is a 44 y.o. male who presents for follow up of his HTN. He has not been having any CP or dyspnea. Eating better , does eat chicken wings every other Wednesday.   We performed an echo card gram back in 2011 which showed normal left ventricular systolic function. Mild mitral regurgitation.  No palpitations. Works for [employer] ( now has changed name to [employer] )   July 27, 2014:   Doing well . Reduced sex drive  Sept. 14, 4098: Doing well BP is a bit high. He's been working out on a regular basis. He still eats some extra salt ([place] bacon)    Past Medical History  Diagnosis Date  . Hypertension   . GERD (gastroesophageal reflux disease)   . LVH (left ventricular hypertrophy)   . Transient atrial fibrillation or flutter   . ED (erectile dysfunction)   . Renal insufficiency   . OSA (obstructive sleep apnea) 07/14/2014    Moderate OSA with AHI 20/hr    Past Surgical History  Procedure Laterality Date  . Transthoracic echocardiogram  05/07/2009    EF 60-65%     Current Outpatient Prescriptions  Medication Sig Dispense Refill  . aspirin 325 MG tablet Take 325 mg by mouth daily.      [name] diltiazem (CARDIZEM CD) 120 MG 24 hr capsule Take 1 capsule (120 mg total) by mouth daily. 30 capsule 0  . lisinopril (PRINIVIL,ZESTRIL) 40 MG tablet TAKE ONE TABLET BY MOUTH ONCE DAILY 30 tablet 11  . omeprazole (PRILOSEC OTC) 20 MG tablet Take 20 mg by mouth daily.    [name] triamterene-hydrochlorothiazide (MAXZIDE) 75-50 MG per tablet Take 1 tablet by mouth daily. 90 tablet 3   No current  facility-administered medications for this visit.    Allergies:   Review of patient's allergies indicates no known allergies.    Social History:  The patient  reports that he has never smoked. He has never used smokeless tobacco. He reports that he drinks alcohol. He reports that he does not use illicit drugs.   Family History:  The patient's family history includes Coronary artery disease in his father; Hypertension in his father.    ROS:  Please see the history of present illness.    Review of Systems: Constitutional:  denies fever, chills, diaphoresis, appetite change and fatigue.  HEENT: denies photophobia, eye pain, redness, hearing loss, ear pain, congestion, sore throat, rhinorrhea, sneezing, neck pain, neck stiffness and tinnitus.  Respiratory: denies SOB, DOE, cough, chest tightness, and wheezing.  Cardiovascular: denies chest pain, palpitations and leg swelling.  Gastrointestinal: denies nausea, vomiting, abdominal pain, diarrhea, constipation, blood in stool.  Genitourinary: denies dysuria, urgency, frequency, hematuria, flank pain and difficulty urinating.  Musculoskeletal: denies  myalgias, back pain, joint swelling, arthralgias and gait problem.   Skin: denies pallor, rash and wound.  Neurological: denies dizziness, seizures, syncope, weakness, light-headedness, numbness and headaches.   Hematological: denies adenopathy, easy bruising, personal or family bleeding history.  Psychiatric/ Behavioral: denies suicidal ideation, mood changes, confusion, nervousness, sleep disturbance and agitation.       All other systems are  reviewed and negative.    PHYSICAL EXAM: VS:  BP 140/96 mmHg  Pulse 62  Ht 6' (1.829 m)  Wt 117.082 kg (258 lb 1.9 oz)  BMI 35.00 kg/m2  SpO2 96% , BMI Body mass index is 35 kg/(m^2). GEN: Well nourished, well developed, in no acute distress HEENT: normal Neck: no JVD, carotid bruits, or masses Cardiac: RRR; no murmurs, rubs, or gallops,no  edema  Respiratory:  clear to auscultation bilaterally, normal work of breathing GI: soft, nontender, nondistended, + BS MS: no deformity or atrophy Skin: warm and dry, no rash Neuro:  Strength and sensation are intact Psych: normal   EKG:  EKG is ordered today. The ekg ordered today demonstrates NSR at 68.  LVH with repol abn.    Recent Labs: 03/07/2014: ALT 30; BUN 22; Creatinine, Ser 1.46; Hemoglobin 14.0; Platelets 294.0; Potassium 3.6; Sodium 137    Lipid Panel    Component Value Date/Time   CHOL 158 03/07/2014 0923   TRIG 94.0 03/07/2014 0923   HDL 45.80 03/07/2014 0923   CHOLHDL 3 03/07/2014 0923   VLDL 18.8 03/07/2014 0923   LDLCALC 93 03/07/2014 0923      Wt Readings from Last 3 Encounters:  10/10/14 117.082 kg (258 lb 1.9 oz)  10/02/14 114.76 kg (253 lb)  09/07/14 111.131 kg (245 lb)      Other studies Reviewed: Additional studies/ records that were reviewed today include: . Review of the above records demonstrates:    ASSESSMENT AND PLAN:  1. Hypertension-  Ritesh  with seems to be doing okay.   BP is better. Continue current meds.   2. Left ventricular Hypertrophy - stable   3. Transient atrial fibrillation- maintaining NSR.  Continue dilt    4.   Suspected sleep apnea:  Has OSA.  Going to get his CPAP  5. ED - will write for Sildinifil 20 mg tabs, #50.    Current medicines are reviewed at length with the patient today.  The patient does not have concerns regarding medicines.  The following changes have been made:  Increase Maxzide    Disposition:   FU with me in 6 months  For OV and fasting labs.     Nakeyia Menden, Deloris Ping, MD  10/10/2014 3:42 PM    [facility] [address] [place], [place], [place]  [address] Phone: [phone]; Fax: [phone]

## 2014-10-10 NOTE — Patient Instructions (Signed)

## 2015-01-06 ENCOUNTER — Encounter (HOSPITAL_COMMUNITY): Payer: Self-pay | Admitting: *Deleted

## 2015-01-06 ENCOUNTER — Emergency Department (HOSPITAL_COMMUNITY): Payer: BLUE CROSS/BLUE SHIELD

## 2015-01-06 ENCOUNTER — Emergency Department (HOSPITAL_COMMUNITY)
Admission: EM | Admit: 2015-01-06 | Discharge: 2015-01-06 | Disposition: A | Discharge status: Home / Self Care | Payer: BLUE CROSS/BLUE SHIELD | Attending: Emergency Medicine | Admitting: Emergency Medicine

## 2015-01-06 DIAGNOSIS — K219 Gastro-esophageal reflux disease without esophagitis: Secondary | ICD-10-CM | POA: Diagnosis not present

## 2015-01-06 DIAGNOSIS — N289 Disorder of kidney and ureter, unspecified: Secondary | ICD-10-CM | POA: Diagnosis not present

## 2015-01-06 DIAGNOSIS — Z23 Encounter for immunization: Secondary | ICD-10-CM | POA: Insufficient documentation

## 2015-01-06 DIAGNOSIS — S4991XA Unspecified injury of right shoulder and upper arm, initial encounter: Secondary | ICD-10-CM | POA: Diagnosis present

## 2015-01-06 DIAGNOSIS — I10 Essential (primary) hypertension: Secondary | ICD-10-CM | POA: Insufficient documentation

## 2015-01-06 DIAGNOSIS — I4891 Unspecified atrial fibrillation: Secondary | ICD-10-CM | POA: Diagnosis not present

## 2015-01-06 DIAGNOSIS — Z87448 Personal history of other diseases of urinary system: Secondary | ICD-10-CM | POA: Diagnosis not present

## 2015-01-06 DIAGNOSIS — G4733 Obstructive sleep apnea (adult) (pediatric): Secondary | ICD-10-CM | POA: Insufficient documentation

## 2015-01-06 DIAGNOSIS — Y9289 Other specified places as the place of occurrence of the external cause: Secondary | ICD-10-CM | POA: Diagnosis not present

## 2015-01-06 DIAGNOSIS — E876 Hypokalemia: Secondary | ICD-10-CM | POA: Diagnosis not present

## 2015-01-06 DIAGNOSIS — Z7982 Long term (current) use of aspirin: Secondary | ICD-10-CM | POA: Insufficient documentation

## 2015-01-06 DIAGNOSIS — Z79899 Other long term (current) drug therapy: Secondary | ICD-10-CM | POA: Insufficient documentation

## 2015-01-06 DIAGNOSIS — Z9981 Dependence on supplemental oxygen: Secondary | ICD-10-CM | POA: Insufficient documentation

## 2015-01-06 DIAGNOSIS — Y998 Other external cause status: Secondary | ICD-10-CM | POA: Insufficient documentation

## 2015-01-06 DIAGNOSIS — Y9389 Activity, other specified: Secondary | ICD-10-CM | POA: Insufficient documentation

## 2015-01-06 DIAGNOSIS — S41011A Laceration without foreign body of right shoulder, initial encounter: Secondary | ICD-10-CM | POA: Insufficient documentation

## 2015-01-06 DIAGNOSIS — S21131A Puncture wound without foreign body of right front wall of thorax without penetration into thoracic cavity, initial encounter: Secondary | ICD-10-CM

## 2015-01-06 LAB — TYPE AND SCREEN
ABO/RH(D): A POS
Antibody Screen: NEGATIVE
UNIT DIVISION: 0
Unit division: 0

## 2015-01-06 LAB — COMPREHENSIVE METABOLIC PANEL
ALBUMIN: 4.3 g/dL (ref 3.5–5.0)
ALK PHOS: 63 U/L (ref 38–126)
ALT: 30 U/L (ref 17–63)
AST: 32 U/L (ref 15–41)
Anion gap: 12 (ref 5–15)
BUN: 22 mg/dL — AB (ref 6–20)
CALCIUM: 9.4 mg/dL (ref 8.9–10.3)
CO2: 23 mmol/L (ref 22–32)
CREATININE: 1.54 mg/dL — AB (ref 0.61–1.24)
Chloride: 102 mmol/L (ref 101–111)
GFR calc non Af Amer: 53 mL/min — ABNORMAL LOW (ref >60)
GLUCOSE: 130 mg/dL — AB (ref 65–99)
Potassium: 2.9 mmol/L — ABNORMAL LOW (ref 3.5–5.1)
SODIUM: 137 mmol/L (ref 135–145)
Total Bilirubin: 0.7 mg/dL (ref 0.3–1.2)
Total Protein: 8 g/dL (ref 6.5–8.1)

## 2015-01-06 LAB — PREPARE FRESH FROZEN PLASMA
UNIT DIVISION: 0
Unit division: 0

## 2015-01-06 LAB — CBC
HCT: 44.7 % (ref 39.0–52.0)
Hemoglobin: 15.2 g/dL (ref 13.0–17.0)
MCH: 28.9 pg (ref 26.0–34.0)
MCHC: 34 g/dL (ref 30.0–36.0)
MCV: 85 fL (ref 78.0–100.0)
PLATELETS: 309 10*3/uL (ref 150–400)
RBC: 5.26 MIL/uL (ref 4.22–5.81)
RDW: 12.3 % (ref 11.5–15.5)
WBC: 9.3 10*3/uL (ref 4.0–10.5)

## 2015-01-06 LAB — ETHANOL: ALCOHOL ETHYL (B): 11 mg/dL — AB (ref <5)

## 2015-01-06 LAB — PROTIME-INR
INR: 1.04 (ref 0.00–1.49)
Prothrombin Time: 13.8 seconds (ref 11.6–15.2)

## 2015-01-06 LAB — CDS SEROLOGY

## 2015-01-06 LAB — ABO/RH: ABO/RH(D): A POS

## 2015-01-06 MED ORDER — FENTANYL CITRATE (PF) 100 MCG/2ML IJ SOLN
INTRAMUSCULAR | Status: AC | PRN
  Administered 2015-01-06: 100 ug via INTRAVENOUS

## 2015-01-06 MED ORDER — OXYCODONE-ACETAMINOPHEN 5-325 MG PO TABS: 1.0000 | ORAL_TABLET | ORAL | Status: DC | PRN

## 2015-01-06 MED ORDER — LIDOCAINE-EPINEPHRINE (PF) 2 %-1:200000 IJ SOLN
10.0000 mL | Freq: Once | INTRAMUSCULAR | Status: AC
  Administered 2015-01-06: 10 mL
  Filled 2015-01-06: qty 20

## 2015-01-06 MED ORDER — FENTANYL CITRATE (PF) 100 MCG/2ML IJ SOLN
INTRAMUSCULAR | Status: AC
  Filled 2015-01-06: qty 2

## 2015-01-06 MED ORDER — ONDANSETRON HCL 4 MG/2ML IJ SOLN
INTRAMUSCULAR | Status: AC
Start: 2015-01-06 — End: 2015-01-06
  Administered 2015-01-06: 4 mg
  Filled 2015-01-06: qty 2

## 2015-01-06 MED ORDER — IOHEXOL 350 MG/ML SOLN
100.0000 mL | Freq: Once | INTRAVENOUS | Status: AC | PRN
  Administered 2015-01-06: 100 mL via INTRAVENOUS

## 2015-01-06 MED ORDER — CEFAZOLIN SODIUM-DEXTROSE 2-3 GM-% IV SOLR
INTRAVENOUS | Status: AC
  Filled 2015-01-06: qty 50

## 2015-01-06 MED ORDER — SODIUM CHLORIDE 0.9 % IV SOLN
INTRAVENOUS | Status: AC | PRN
  Administered 2015-01-06: 125 mL/h via INTRAVENOUS

## 2015-01-06 MED ORDER — TETANUS-DIPHTH-ACELL PERTUSSIS 5-2.5-18.5 LF-MCG/0.5 IM SUSP
INTRAMUSCULAR | Status: AC
  Administered 2015-01-06: 0.5 mL via INTRAMUSCULAR
  Filled 2015-01-06: qty 0.5

## 2015-01-06 MED ORDER — CEFAZOLIN SODIUM-DEXTROSE 2-3 GM-% IV SOLR
2.0000 g | Freq: Once | INTRAVENOUS | Status: AC
  Administered 2015-01-06: 2 g via INTRAVENOUS

## 2015-01-06 MED ORDER — POTASSIUM CHLORIDE CRYS ER 20 MEQ PO TBCR: 20.0000 meq | EXTENDED_RELEASE_TABLET | Freq: Two times a day (BID) | ORAL | Status: DC

## 2015-01-06 MED ORDER — POTASSIUM CHLORIDE CRYS ER 20 MEQ PO TBCR
40.0000 meq | EXTENDED_RELEASE_TABLET | Freq: Once | ORAL | Status: AC
  Administered 2015-01-06: 40 meq via ORAL
  Filled 2015-01-06: qty 2

## 2015-01-06 MED ORDER — CEPHALEXIN 500 MG PO CAPS: 500.0000 mg | ORAL_CAPSULE | Freq: Three times a day (TID) | ORAL | Status: DC
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
## 2015-01-06 NOTE — Discharge Instructions (Signed)
Staples need to be removed in ten days.  Laceration Care, Adult A laceration is a cut that goes through all of the layers of the skin and into the tissue that is right under the skin. Some lacerations heal on their own. Others need to be closed with stitches (sutures), staples, skin adhesive strips, or skin glue. Proper laceration care minimizes the risk of infection and helps the laceration to heal better. HOW TO CARE FOR YOUR LACERATION If sutures or staples were used:  Keep the wound clean and dry.  If you were given a bandage (dressing), you should change it at least one time per day or as told by your health care provider. You should also change it if it becomes wet or dirty.  Keep the wound completely dry for the first 24 hours or as told by your health care provider. After that time, you may shower or bathe. However, make sure that the wound is not soaked in water until after the sutures or staples have been removed.  Clean the wound one time each day or as told by your health care provider:  Wash the wound with soap and water.  Rinse the wound with water to remove all soap.  Pat the wound dry with a clean towel. Do not rub the wound.  After cleaning the wound, apply a thin layer of antibiotic ointmentas told by your health care provider. This will help to prevent infection and keep the dressing from sticking to the wound.  Have the sutures or staples removed as told by your health care provider. If skin adhesive strips were used:  Keep the wound clean and dry.  If you were given a bandage (dressing), you should change it at least one time per day or as told by your health care provider. You should also change it if it becomes dirty or wet.  Do not get the skin adhesive strips wet. You may shower or bathe, but be careful to keep the wound dry.  If the wound gets wet, pat it dry with a clean towel. Do not rub the wound.  Skin adhesive strips fall off on their own. You may  trim the strips as the wound heals. Do not remove skin adhesive strips that are still stuck to the wound. They will fall off in time. If skin glue was used:  Try to keep the wound dry, but you may briefly wet it in the shower or bath. Do not soak the wound in water, such as by swimming.  After you have showered or bathed, gently pat the wound dry with a clean towel. Do not rub the wound.  Do not do any activities that will make you sweat heavily until the skin glue has fallen off on its own.  Do not apply liquid, cream, or ointment medicine to the wound while the skin glue is in place. Using those may loosen the film before the wound has healed.  If you were given a bandage (dressing), you should change it at least one time per day or as told by your health care provider. You should also change it if it becomes dirty or wet.  If a dressing is placed over the wound, be careful not to apply tape directly over the skin glue. Doing that may cause the glue to be pulled off before the wound has healed.  Do not pick at the glue. The skin glue usually remains in place for 5-10 days, then it falls off of  the skin. General Instructions  Take over-the-counter and prescription medicines only as told by your health care provider.  If you were prescribed an antibiotic medicine or ointment, take or apply it as told by your doctor. Do not stop using it even if your condition improves.  To help prevent scarring, make sure to cover your wound with sunscreen whenever you are outside after stitches are removed, after adhesive strips are removed, or when glue remains in place and the wound is healed. Make sure to wear a sunscreen of at least 30 SPF.  Do not scratch or pick at the wound.  Keep all follow-up visits as told by your health care provider. This is important.  Check your wound every day for signs of infection. Watch for:  Redness, swelling, or pain.  Fluid, blood, or pus.  Raise (elevate) the  injured area above the level of your heart while you are sitting or lying down, if possible. SEEK MEDICAL CARE IF:  You received a tetanus shot and you have swelling, severe pain, redness, or bleeding at the injection site.  You have a fever.  A wound that was closed breaks open.  You notice a bad smell coming from your wound or your dressing.  You notice something coming out of the wound, such as wood or glass.  Your pain is not controlled with medicine.  You have increased redness, swelling, or pain at the site of your wound.  You have fluid, blood, or pus coming from your wound.  You notice a change in the color of your skin near your wound.  You need to change the dressing frequently due to fluid, blood, or pus draining from the wound.  You develop a new rash.  You develop numbness around the wound. SEEK IMMEDIATE MEDICAL CARE IF:  You develop severe swelling around the wound.  Your pain suddenly increases and is severe.  You develop painful lumps near the wound or on skin that is anywhere on your body.  You have a red streak going away from your wound.  The wound is on your hand or foot and you cannot properly move a finger or toe.  The wound is on your hand or foot and you notice that your fingers or toes look pale or bluish.   This information is not intended to replace advice given to you by your health care provider. Make sure you discuss any questions you have with your health care provider.   Document Released: 01/12/2005 Document Revised: 05/29/2014 Document Reviewed: 01/08/2014 Elsevier Interactive Patient Education 2016 [employer]  Cephalexin tablets or capsules What is this medicine? CEPHALEXIN (sef a LEX in) is a cephalosporin antibiotic. It is used to treat certain kinds of bacterial infections It will not work for colds, flu, or other viral infections. This medicine may be used for other purposes; ask your health care provider or pharmacist if you  have questions. What should I tell my health care provider before I take this medicine? They need to know if you have any of these conditions: -kidney disease -stomach or intestine problems, especially colitis -an unusual or allergic reaction to cephalexin, other cephalosporins, penicillins, other antibiotics, medicines, foods, dyes or preservatives -pregnant or trying to get pregnant -breast-feeding How should I use this medicine? Take this medicine by mouth with a full glass of water. Follow the directions on the prescription label. This medicine can be taken with or without food. Take your medicine at regular intervals. Do not take your medicine  more often than directed. Take all of your medicine as directed even if you think you are better. Do not skip doses or stop your medicine early. Talk to your pediatrician regarding the use of this medicine in children. While this drug may be prescribed for selected conditions, precautions do apply. Overdosage: If you think you have taken too much of this medicine contact a poison control center or emergency room at once. NOTE: This medicine is only for you. Do not share this medicine with others. What if I miss a dose? If you miss a dose, take it as soon as you can. If it is almost time for your next dose, take only that dose. Do not take double or extra doses. There should be at least 4 to 6 hours between doses. What may interact with this medicine? -probenecid -some other antibiotics This list may not describe all possible interactions. Give your health care provider a list of all the medicines, herbs, non-prescription drugs, or dietary supplements you use. Also tell them if you smoke, drink alcohol, or use illegal drugs. Some items may interact with your medicine. What should I watch for while using this medicine? Tell your doctor or health care professional if your symptoms do not begin to improve in a few days. Do not treat diarrhea with over the  counter products. Contact your doctor if you have diarrhea that lasts more than 2 days or if it is severe and watery. If you have diabetes, you may get a false-positive result for sugar in your urine. Check with your doctor or health care professional. What side effects may I notice from receiving this medicine? Side effects that you should report to your doctor or health care professional as soon as possible: -allergic reactions like skin rash, itching or hives, swelling of the face, lips, or tongue -breathing problems -pain or trouble passing urine -redness, blistering, peeling or loosening of the skin, including inside the mouth -severe or watery diarrhea -unusually weak or tired -yellowing of the eyes, skin Side effects that usually do not require medical attention (report to your doctor or health care professional if they continue or are bothersome): -gas or heartburn -genital or anal irritation -headache -joint or muscle pain -nausea, vomiting This list may not describe all possible side effects. Call your doctor for medical advice about side effects. You may report side effects to FDA at 1-800-FDA-1088. Where should I keep my medicine? Keep out of the reach of children. Store at room temperature between 59 and 86 degrees F (15 and 30 degrees C). Throw away any unused medicine after the expiration date. NOTE: This sheet is a summary. It may not cover all possible information. If you have questions about this medicine, talk to your doctor, pharmacist, or health care provider.    2016, Elsevier/[employer] Standard. (2007-04-18 17:09:13)  Acetaminophen; Oxycodone tablets What is this medicine? ACETAMINOPHEN; OXYCODONE (a set a MEE noe fen; ox i KOE done) is a pain reliever. It is used to treat moderate to severe pain. This medicine may be used for other purposes; ask your health care provider or pharmacist if you have questions. What should I tell my health care provider before I take this  medicine? They need to know if you have any of these conditions: -brain tumor -Crohn's disease, inflammatory bowel disease, or ulcerative colitis -drug abuse or addiction -head injury -heart or circulation problems -if you often drink alcohol -kidney disease or problems going to the bathroom -liver disease -lung disease, asthma,  or breathing problems -an unusual or allergic reaction to acetaminophen, oxycodone, other opioid analgesics, other medicines, foods, dyes, or preservatives -pregnant or trying to get pregnant -breast-feeding How should I use this medicine? Take this medicine by mouth with a full glass of water. Follow the directions on the prescription label. You can take it with or without food. If it upsets your stomach, take it with food. Take your medicine at regular intervals. Do not take it more often than directed. Talk to your pediatrician regarding the use of this medicine in children. Special care may be needed. Patients over 49 years old may have a stronger reaction and need a smaller dose. Overdosage: If you think you have taken too much of this medicine contact a poison control center or emergency room at once. NOTE: This medicine is only for you. Do not share this medicine with others. What if I miss a dose? If you miss a dose, take it as soon as you can. If it is almost time for your next dose, take only that dose. Do not take double or extra doses. What may interact with this medicine? -alcohol -antihistamines -barbiturates like amobarbital, butalbital, butabarbital, methohexital, pentobarbital, phenobarbital, thiopental, and secobarbital -benztropine -drugs for bladder problems like solifenacin, trospium, oxybutynin, tolterodine, hyoscyamine, and methscopolamine -drugs for breathing problems like ipratropium and tiotropium -drugs for certain stomach or intestine problems like propantheline, homatropine methylbromide, glycopyrrolate, atropine, belladonna, and  dicyclomine -general anesthetics like etomidate, ketamine, nitrous oxide, propofol, desflurane, enflurane, halothane, isoflurane, and sevoflurane -medicines for depression, anxiety, or psychotic disturbances -medicines for sleep -muscle relaxants -naltrexone -narcotic medicines (opiates) for pain -phenothiazines like perphenazine, thioridazine, chlorpromazine, mesoridazine, fluphenazine, prochlorperazine, promazine, and trifluoperazine -scopolamine -tramadol -trihexyphenidyl This list may not describe all possible interactions. Give your health care provider a list of all the medicines, herbs, non-prescription drugs, or dietary supplements you use. Also tell them if you smoke, drink alcohol, or use illegal drugs. Some items may interact with your medicine. What should I watch for while using this medicine? Tell your doctor or health care professional if your pain does not go away, if it gets worse, or if you have new or a different type of pain. You may develop tolerance to the medicine. Tolerance means that you will need a higher dose of the medication for pain relief. Tolerance is normal and is expected if you take this medicine for a long time. Do not suddenly stop taking your medicine because you may develop a severe reaction. Your body becomes used to the medicine. This does NOT mean you are addicted. Addiction is a behavior related to getting and using a drug for a non-medical reason. If you have pain, you have a medical reason to take pain medicine. Your doctor will tell you how much medicine to take. If your doctor wants you to stop the medicine, the dose will be slowly lowered over time to avoid any side effects. You may get drowsy or dizzy. Do not drive, use machinery, or do anything that needs mental alertness until you know how this medicine affects you. Do not stand or sit up quickly, especially if you are an older patient. This reduces the risk of dizzy or fainting spells. Alcohol may  interfere with the effect of this medicine. Avoid alcoholic drinks. There are different types of narcotic medicines (opiates) for pain. If you take more than one type at the same time, you may have more side effects. Give your health care provider a list of all medicines you  use. Your doctor will tell you how much medicine to take. Do not take more medicine than directed. Call emergency for help if you have problems breathing. The medicine will cause constipation. Try to have a bowel movement at least every 2 to 3 days. If you do not have a bowel movement for 3 days, call your doctor or health care professional. Do not take Tylenol (acetaminophen) or medicines that have acetaminophen with this medicine. Too much acetaminophen can be very dangerous. Many nonprescription medicines contain acetaminophen. Always read the labels carefully to avoid taking more acetaminophen. What side effects may I notice from receiving this medicine? Side effects that you should report to your doctor or health care professional as soon as possible: -allergic reactions like skin rash, itching or hives, swelling of the face, lips, or tongue -breathing difficulties, wheezing -confusion -light headedness or fainting spells -severe stomach pain -unusually weak or tired -yellowing of the skin or the whites of the eyes Side effects that usually do not require medical attention (report to your doctor or health care professional if they continue or are bothersome): -dizziness -drowsiness -nausea -vomiting This list may not describe all possible side effects. Call your doctor for medical advice about side effects. You may report side effects to FDA at 1-800-FDA-1088. Where should I keep my medicine? Keep out of the reach of children. This medicine can be abused. Keep your medicine in a safe place to protect it from theft. Do not share this medicine with anyone. Selling or giving away this medicine is dangerous and against the  law. This medicine may cause accidental overdose and death if it taken by other adults, children, or pets. Mix any unused medicine with a substance like cat litter or coffee grounds. Then throw the medicine away in a sealed container like a sealed bag or a coffee can with a lid. Do not use the medicine after the expiration date. Store at room temperature between 20 and 25 degrees C (68 and 77 degrees F). NOTE: This sheet is a summary. It may not cover all possible information. If you have questions about this medicine, talk to your doctor, pharmacist, or health care provider.    2016, Elsevier/Gold Standard. (2013-12-13 15:18:46)  Potassium Salts tablets, extended-release tablets or capsules What is this medicine? POTASSIUM (poe TASS i um) is a natural salt that is important for the heart, muscles, and nerves. It is found in many foods and is normally supplied by a well balanced diet. This medicine is used to treat low potassium. This medicine may be used for other purposes; ask your health care provider or pharmacist if you have questions. What should I tell my health care provider before I take this medicine? They need to know if you have any of these conditions: -Addison's disease -dehydration -diabetes -difficulty swallowing -heart disease -history of high levels of potassium in the blood -irregular heartbeat -kidney disease -recent severe burn -stomach ulcers or other stomach problems -an unusual or allergic reaction to potassium, tartrazine, other medicines, foods, dyes, or preservatives -pregnant or trying to get pregnant -breast-feeding How should I use this medicine? Take this medicine by mouth with a full glass of water. Take with food. Follow the directions on the prescription label. Do not suck on, crush, or chew this medicine. If you have difficulty swallowing, ask the pharmacist how to take. Take your medicine at regular intervals. Do not take it more often than directed. Do  not stop taking except on your doctor's advice. Talk  to your pediatrician regarding the use of this medicine in children. Special care may be needed. Overdosage: If you think you have taken too much of this medicine contact a poison control center or emergency room at once. NOTE: This medicine is only for you. Do not share this medicine with others. What if I miss a dose? If you miss a dose, take it as soon as you can. If it is almost time for your next dose, take only that dose. Do not take double or extra doses. What may interact with this medicine? Do not take this medicine with any of the following medications: -eplerenone -certain medicines for stomach problems like atropine; difenoxin and glycopyrrolate -sodium polystyrene sulfonate This medicine may also interact with the following medications: -certain medicines for blood pressure or heart disease like lisinopril, losartan, quinapril, valsartan -medicines for cold or allergies -NSAIDs, medicines for pain and inflammation, like ibuprofen or napoxen -other potassium supplements -salt substitutes -some diuretics This list may not describe all possible interactions. Give your health care provider a list of all the medicines, herbs, non-prescription drugs, or dietary supplements you use. Also tell them if you smoke, drink alcohol, or use illegal drugs. Some items may interact with your medicine. What should I watch for while using this medicine? Visit your doctor or health care professional for regular check ups. You will need lab work done regularly. You may need to be on a special diet while taking this medicine. Ask your doctor. What side effects may I notice from receiving this medicine? Side effects that you should report to your doctor or health care professional as soon as possible: -allergic reactions like skin rash, itching or hives, swelling of the face, lips, or tongue -anxious -black, tarry stools -breathing  problems -confusion -heartburn -irregular heartbeat -numbness or tingling in hands or feet -pain when swallowing -unusually weak or tired -weakness, heaviness of legs Side effects that usually do not require medical attention (report to your doctor or health care professional if they continue or are bothersome): -diarrhea -nausea -upset stomach -vomiting This list may not describe all possible side effects. Call your doctor for medical advice about side effects. You may report side effects to FDA at 1-800-FDA-1088. Where should I keep my medicine? Keep out of the reach of children. Store at room temperature between 15 and 30 degrees C (59 and 86 degrees F ). Keep bottle closed tightly to protect this medicine from light and moisture. Throw away any unused medicine after the expiration date. NOTE: This sheet is a summary. It may not cover all possible information. If you have questions about this medicine, talk to your doctor, pharmacist, or health care provider.    2016, Elsevier/[employer] Standard. (2014-06-21 08:55:21)

## 2015-01-06 NOTE — ED Notes (Signed)
Preston FleetingGlick MD at bedside to suture patient

## 2015-01-06 NOTE — ED Notes (Signed)
Pt. Left with all belongings and refused wheelchair. Discharge instructions were reviewed and all questions were answered.  
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
## 2015-01-06 NOTE — ED Notes (Signed)
Pt is asleep

## 2015-01-15 ENCOUNTER — Emergency Department (HOSPITAL_COMMUNITY)
Admission: EM | Admit: 2015-01-15 | Discharge: 2015-01-15 | Disposition: A | Discharge status: Home / Self Care | Payer: BLUE CROSS/BLUE SHIELD | Attending: Emergency Medicine | Admitting: Emergency Medicine

## 2015-01-15 ENCOUNTER — Encounter (HOSPITAL_COMMUNITY): Payer: Self-pay

## 2015-01-15 DIAGNOSIS — Z87828 Personal history of other (healed) physical injury and trauma: Secondary | ICD-10-CM | POA: Diagnosis not present

## 2015-01-15 DIAGNOSIS — I4892 Unspecified atrial flutter: Secondary | ICD-10-CM | POA: Diagnosis not present

## 2015-01-15 DIAGNOSIS — Z79899 Other long term (current) drug therapy: Secondary | ICD-10-CM | POA: Diagnosis not present

## 2015-01-15 DIAGNOSIS — I10 Essential (primary) hypertension: Secondary | ICD-10-CM | POA: Diagnosis not present

## 2015-01-15 DIAGNOSIS — N529 Male erectile dysfunction, unspecified: Secondary | ICD-10-CM | POA: Insufficient documentation

## 2015-01-15 DIAGNOSIS — Z4802 Encounter for removal of sutures: Secondary | ICD-10-CM | POA: Diagnosis not present

## 2015-01-15 DIAGNOSIS — K219 Gastro-esophageal reflux disease without esophagitis: Secondary | ICD-10-CM | POA: Insufficient documentation

## 2015-01-15 DIAGNOSIS — Z792 Long term (current) use of antibiotics: Secondary | ICD-10-CM | POA: Diagnosis not present

## 2015-01-15 DIAGNOSIS — Z8669 Personal history of other diseases of the nervous system and sense organs: Secondary | ICD-10-CM | POA: Insufficient documentation

## 2015-01-15 DIAGNOSIS — Z7982 Long term (current) use of aspirin: Secondary | ICD-10-CM | POA: Insufficient documentation

## 2015-01-15 DIAGNOSIS — I4891 Unspecified atrial fibrillation: Secondary | ICD-10-CM | POA: Diagnosis not present

## 2015-01-15 NOTE — ED Notes (Signed)
Pt here to have sutures removed from right shoulder. Site clean, dry, no drainage.

## 2015-01-15 NOTE — Discharge Instructions (Signed)

## 2015-01-15 NOTE — ED Notes (Signed)
Pt verbalized understanding of d/c instructions and follow-up care. No further questions/concerns, VSS, ambulatory w/ steady gait (refused wheelchair) 
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
## 2015-02-17 ENCOUNTER — Emergency Department (HOSPITAL_COMMUNITY)
Admission: EM | Admit: 2015-02-17 | Discharge: 2015-02-17 | Disposition: A | Discharge status: Home / Self Care | Payer: Managed Care, Other (non HMO) | Attending: Emergency Medicine | Admitting: Emergency Medicine

## 2015-02-17 ENCOUNTER — Encounter (HOSPITAL_COMMUNITY): Payer: Self-pay | Admitting: *Deleted

## 2015-02-17 DIAGNOSIS — R634 Abnormal weight loss: Secondary | ICD-10-CM | POA: Diagnosis present

## 2015-02-17 DIAGNOSIS — K219 Gastro-esophageal reflux disease without esophagitis: Secondary | ICD-10-CM | POA: Insufficient documentation

## 2015-02-17 DIAGNOSIS — I10 Essential (primary) hypertension: Secondary | ICD-10-CM | POA: Insufficient documentation

## 2015-02-17 DIAGNOSIS — Z7982 Long term (current) use of aspirin: Secondary | ICD-10-CM | POA: Insufficient documentation

## 2015-02-17 DIAGNOSIS — N529 Male erectile dysfunction, unspecified: Secondary | ICD-10-CM | POA: Insufficient documentation

## 2015-02-17 DIAGNOSIS — Z8669 Personal history of other diseases of the nervous system and sense organs: Secondary | ICD-10-CM | POA: Diagnosis not present

## 2015-02-17 DIAGNOSIS — I4891 Unspecified atrial fibrillation: Secondary | ICD-10-CM | POA: Insufficient documentation

## 2015-02-17 DIAGNOSIS — Z79899 Other long term (current) drug therapy: Secondary | ICD-10-CM | POA: Diagnosis not present

## 2015-02-17 LAB — CBC WITH DIFFERENTIAL/PLATELET
Basophils Absolute: 0 10*3/uL (ref 0.0–0.1)
Basophils Relative: 0 %
EOS ABS: 0 10*3/uL (ref 0.0–0.7)
Eosinophils Relative: 0 %
HEMATOCRIT: 39.6 % (ref 39.0–52.0)
HEMOGLOBIN: 13.6 g/dL (ref 13.0–17.0)
LYMPHS ABS: 1.3 10*3/uL (ref 0.7–4.0)
LYMPHS PCT: 28 %
MCH: 28.9 pg (ref 26.0–34.0)
MCHC: 34.3 g/dL (ref 30.0–36.0)
MCV: 84.3 fL (ref 78.0–100.0)
Monocytes Absolute: 0.4 10*3/uL (ref 0.1–1.0)
Monocytes Relative: 8 %
NEUTROS ABS: 3 10*3/uL (ref 1.7–7.7)
NEUTROS PCT: 64 %
Platelets: 296 10*3/uL (ref 150–400)
RBC: 4.7 MIL/uL (ref 4.22–5.81)
RDW: 12.2 % (ref 11.5–15.5)
WBC: 4.7 10*3/uL (ref 4.0–10.5)

## 2015-02-17 LAB — COMPREHENSIVE METABOLIC PANEL
ALBUMIN: 4.3 g/dL (ref 3.5–5.0)
ALK PHOS: 57 U/L (ref 38–126)
ALT: 24 U/L (ref 17–63)
AST: 26 U/L (ref 15–41)
Anion gap: 10 (ref 5–15)
BILIRUBIN TOTAL: 0.7 mg/dL (ref 0.3–1.2)
BUN: 17 mg/dL (ref 6–20)
CALCIUM: 9.7 mg/dL (ref 8.9–10.3)
CO2: 24 mmol/L (ref 22–32)
CREATININE: 1.27 mg/dL — AB (ref 0.61–1.24)
Chloride: 105 mmol/L (ref 101–111)
GFR calc non Af Amer: >60 mL/min (ref >60)
GLUCOSE: 109 mg/dL — AB (ref 65–99)
Potassium: 3.8 mmol/L (ref 3.5–5.1)
SODIUM: 139 mmol/L (ref 135–145)
Total Protein: 8 g/dL (ref 6.5–8.1)

## 2015-02-17 NOTE — Discharge Instructions (Signed)
°Emergency Department Resource Guide °1) Find a Doctor and Pay Out of Pocket °Although you won't have to find out who is covered by your insurance plan, it is a good idea to ask around and get recommendations. You will then need to call the office and see if the doctor you have chosen will accept you as a new patient and what types of options they offer for patients who are self-pay. Some doctors offer discounts or will set up payment plans for their patients who do not have insurance, but you will need to ask so you aren't surprised when you get to your appointment. ° °2) Contact Your Local Health Department °Not all health departments have doctors that can see patients for sick visits, but many do, so it is worth a call to see if yours does. If you don't know where your local health department is, you can check in your phone book. The CDC also has a tool to help you locate your state's health department, and many state websites also have listings of all of their local health departments. ° °3) Find a Walk-in Clinic °If your illness is not likely to be very severe or complicated, you may want to try a walk in clinic. These are popping up all over the country in pharmacies, drugstores, and shopping centers. They're usually staffed by nurse practitioners or physician assistants that have been trained to treat common illnesses and complaints. They're usually fairly quick and inexpensive. However, if you have serious medical issues or chronic medical problems, these are probably not your best option. ° °No Primary Care Doctor: °- Call [facility] at  832-8000 - they can help you locate a primary care doctor that  accepts your insurance, provides certain services, etc. °- Physician Referral Service- 1-800-533-3463 ° °Chronic Pain Problems: °Organization         Address  Phone   Notes  °[facility]  (336) 297-2271 Patients need to be referred by their primary care doctor.  ° °Medication  Assistance: °Organization         Address  Phone   Notes  °[place] [facility] [address] °[place], [place] [address] (336) 641-8030 --Must be a resident of [place] °-- Must have NO insurance coverage whatsoever (no Medicaid/ Medicare, etc.) °-- The pt. MUST have a primary care doctor that directs their care regularly and follows them in the community °  °[facility]  (866) 331-1348   °[employer]  (888) 892-1162   ° °Agencies that provide inexpensive medical care: °Organization         Address  Phone   Notes  °[facility]  (336) 832-8035   °[facility]    (336) 832-7272   °[facility] [address] °[place], [place] [address] (336) 832-4777   °[facility] [address], °[place] (336) 271-4999   °[facility]    (336) 373-0678   °[facility]    (336) 272-1050   °[facility] ° [address], [place] Phone:  (336) 832-4444, Fax:  (336) 832-4440 Hours of Operation:  9 am - 6 pm, M-F.  Also accepts Medicaid/Medicare and self-pay.  °[facility] ° [address], [place] Phone: (336) 832-3150, Fax: (336) 832-3151. Hours of Operation:  8:30 am - 5:30 pm, M-F.  Also accepts Medicaid and self-pay.  °[facility] [place] [address], [place] Phone: (336) 878-6027   °[facility] [address], [place], [place] (336)723-1848, Ext. 123 Mondays & Thursdays: 7-9 AM.  First 15 patients are seen on a first come, first serve basis. °  ° °Medicaid-accepting [place] Providers: ° °Organization         Address  Phone   Notes  °[facility] [address], [place] (336) 641-2100 Also accepts self-pay patients.  °[facility] [address], [place] ° (336) 856-9996   °[facility] [address], [place]  (336) 288-8857   °[facility] [address] [place] [address], [place] (336) 299-7000   °Veita Bland [address], [place]  ° (336) 373-1557 Only accepts [place] Access Medicaid patients after they have their name applied to their card.  ° °Self-Pay (no insurance) in [place]: ° °Organization         Address  Phone   Notes  °Sickle Cell Patients, [facility] [address], [place] (336) 832-1970   °[facility] [address], [place] (336) 832-4400   ° [facility] ° [address], [place] (336) 992-4800   °[facility]/Dr. Osei-Bonsu ° [address] [place] [address], [place] or [address], [place] (336) 841-8500 Phone number for both [place] and [place] locations is the same.  °[facility] [address], [place] (336) 299-0000   °[facility] [address] [place] [address], [place] or [address] (336) 852-7530 °(336) 878-2260   °[facility] [address], [place] (336) 350-1642, phone; (336) 294-5005, fax Sees patients 1st and 3rd Saturday of every month.  Must not qualify for public or private insurance (i.e. Medicaid, Medicare, [place] Health Choice, Veterans' Benefits) • Household income should be no more than 200% of the poverty level •The clinic cannot treat you if you are pregnant or think you are pregnant • Sexually transmitted diseases are not treated at the clinic.  ° ° °Dental Care: °Organization         Address  Phone  Notes  °[place] [facility] [address], [place] (336) 641-6152 Accepts children up to age 21 who are enrolled in Medicaid or [place] Health Choice; pregnant women with a Medicaid card; and children who have applied for Medicaid or [place] Health Choice, but were declined, whose parents can pay a reduced fee at time of service.  °[place]  [facility] [place]  [address], [place] (336) 641-7733 Accepts children up to age 21 who are enrolled in Medicaid or [place] Health Choice; pregnant women with a Medicaid card; and children who have applied for Medicaid or [place] Health Choice, but were declined, whose parents can pay a reduced fee at time of service.  °[facility] ° [address],  (336) 641-4533 Patients are seen by appointment only. Walk-ins are not accepted. [facility] will see patients 18 years of age and older. °Monday - Tuesday (8am-5pm) °Most Wednesdays (8:30-5pm) °$30 per visit, cash only  °[facility] ° [address], [place] (336) 641-4533 Patients are seen by appointment only. Walk-ins are not accepted. [facility] will see patients 18 years of age and older. °One   Wednesday Evening (Monthly: Volunteer Based).  $30 per visit, cash only  °[facility]  (919) 537-3737 for adults; Children under age 4, call Graduate Pediatric Dentistry at (919) [phone]. Children aged 4-14, please call (919) [phone] to request a pediatric application. ° Dental services are provided in all areas of dental care including fillings, crowns and bridges, complete and partial dentures, implants, gum treatment, root canals, and extractions. Preventive care is also provided. Treatment is provided to both adults and children. °Patients are selected via a lottery and there is often a waiting list. °  °[facility] [address], °[place] ° (336) 763-8833 [email or web address] °  °[facility] [address], [place], [place] (336)723-1848, Ext. 123 Second and Fourth Thursday of each month, opens at 6:30 AM; Clinic ends at 9 AM.  Patients are seen on a first-come first-served basis, and a limited number are seen during each clinic.  ° °[facility] ° [address], [place], [place] (336) 723-7904    Eligibility Requirements °You must have lived in [place], [place], or [place] counties for at least the last three months. °  You cannot be eligible for state or federal sponsored healthcare insurance, including Veterans Administration, Medicaid, or Medicare. °  You generally cannot be eligible for healthcare insurance through your employer.  °  How to apply: °Eligibility screenings are held every Tuesday and Wednesday afternoon from 1:00 pm until 4:00 pm. You do not need an appointment for the interview!  °[facility] [address], [place], [place] 336-631-2330   °[facility] [facility]  336-342-8273   °[place]  336-703-3100   ° [facility]  336-570-6415   ° °Behavioral Health Resources in the Community: °Intensive Outpatient Programs °Organization         Address  Phone  Notes  °[facility] [address], [place], [place] 336-878-6098   °[facility] [address], [place], [place] 336-832-9800   °[facility] [address], [place], [place] ° 336-882-2125   °[facility] [address],  °[place], [place] 1-800-853-5163 or 336-641-4981   °Substance Abuse Resources °Organization         Address  Phone  Notes  °[facility]  336-882-2125   °[facility]  336-784-9470   °[facility]  336-285-9073   °[facility]  336-845-3988   °Residential & Outpatient Substance Abuse Program  1-800-659-3381   °Psychological Services °Organization         Address  Phone  Notes  °[facility]  336- 832-9600   °[facility]  336- 378-7881   °[facility] [address], [place] 1-800-853-5163 or 336-641-4981   ° °Mobile Crisis Teams °Organization         Address  Phone  Notes  °[facility], Mobile Crisis Care Unit  1-877-626-1772   °Assertive °[facility] ° [address]  [place], [place] 336-834-9664   °Sharon DeEsch [address] °[place] 336-554-5454   ° °Self-Help/Support Groups °Organization         Address  Phone             Notes  °[facility] - variety of support groups  336- 373-1402 Call for more information  °Narcotics Anonymous (NA), [facility] [address], °[place]  2 meetings at this location  ° °  Residential Treatment Programs °Organization         Address  Phone  Notes  °[facility] [address],    °[place]  1-866-801-8205   °[facility] ° [address], [place], [place] 704-293-8524   °[facility] [address], [place] 336-845-3988 Admissions: 8am-3pm M-F  °[facility] [address]    °[place], [place] 336-841-1104   °[facility] [address], [place], [place] 336-379-7146   °[facility] [address]  °[place], [place] 336-285-9073   °[facility] Intensive Outpatient [address], [place], [place] 336-852-3033   °[facility] [address]  °[place], [place] 1-877-615-2722 or 336-784-9470   °[facility] [address] , [place] 336-227-7417 Accepts Medicaid  °[facility] [address]  °[place] 1-800-659-3381 Substance Abuse/Addiction Treatment  ° °[place] Behavioral Health Resources °Organization         Address  Phone  Notes  °[facility]  (888) 581-9988   °Julie Brannon, PhD [address] [place], [place]   (336) 349-5553 or (336) 951-0000   °[facility]   [address] °[place], [place] (336) 349-4454   °[facility] [address], [place], [place] (336) 342-8316 Insurance/Medicaid/sponsorship through [facility]  °[facility] [address]                                    [place], [place] (336) 342-8316 Therapy/tele-psych/case    °[facility] [address]  ° [place], [place] (336) 349-2233    °Dr. Arfeen  (336) 349-4544   °[facility] [place]  [employer] [place] [facility] 1) [address], [place] °2) [address], [place] °3)  [address], [place] (336) 349-3220 °(336) 342-7768 ° °(336) 342-8140   °[place] [facility] (336) 342-1394 or (336) 342-3537 (After Hours)    ° ° °

## 2015-02-17 NOTE — ED Provider Notes (Signed)
CSN: [id number]     Arrival date & time 02/17/15  4098 History   First MD Initiated Contact with Patient 02/17/15 [phone]     Chief Complaint  Patient presents with  . Weight Loss     (Consider location/radiation/quality/duration/timing/severity/associated sxs/prior Treatment) HPI  45 year old male presents with a chief complaint of weight loss as well as when he eats he doesn't quite feel full. For last 2 months he has lost a total of 12 pounds. Patient states that especially over the last week whenever he eats he doesn't quite feel full. This has been depressing to him although he does not feel systemically depressed and has no suicidal thoughts. The patient states he typically goes to the gym to try and help lose weight for his high blood pressure. He has been going to the gym less over the last 3 months due to getting a second job. 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 Patient also sleeps poorly, only sleeping 3-4 hours per night, this is attributed to OSA. He does not yet have his CPAP. Patient denies any drug use and states he has not drank alcohol in quite some time. Patient does endorse that he is eating better, eating less fast food and eating more grilled chicken at work. He denies headache, blurry vision, vomiting, cough, chest pain, short of breath, or abdominal pain. Occasionally he has noticed "dents" in his legs, and his socks seem to leave a little ring. He has not no specific swelling.  Past Medical History  Diagnosis Date  . Hypertension   . GERD (gastroesophageal reflux disease)   . LVH (left ventricular hypertrophy)   . Transient atrial fibrillation or flutter   . ED (erectile dysfunction)   . Renal insufficiency   . OSA (obstructive sleep apnea) 07/14/2014    Moderate OSA with AHI 20/hr   Past Surgical History  Procedure Laterality Date  . Transthoracic echocardiogram  05/07/2009    EF 60-65%   Family History   Problem Relation Age of Onset  . Hypertension Father   . Coronary artery disease Father    Social History  Substance Use Topics  . Smoking status: Never Smoker   . Smokeless tobacco: Never Used  . Alcohol Use: Yes     Comment: occasionally    Review of Systems  Constitutional: Positive for unexpected weight change. Negative for fever and diaphoresis.  Respiratory: Negative for cough and shortness of breath.   Cardiovascular: Negative for chest pain.  Gastrointestinal: Negative for vomiting, abdominal pain and diarrhea.  Neurological: Negative for headaches.  Psychiatric/Behavioral: Negative for suicidal ideas and dysphoric mood.  All other systems reviewed and are negative.     Allergies  Review of patient's allergies indicates no known allergies.  Home Medications   Prior to Admission medications   Medication Sig Start Date End Date Taking? Authorizing Provider  aspirin 325 MG tablet Take 325 mg by mouth daily.      Historical Provider, MD  diltiazem (CARDIZEM CD) 120 MG 24 hr capsule Take 1 capsule (120 mg total) by mouth daily. 10/10/14   Vesta Mixer, MD  lisinopril (PRINIVIL,ZESTRIL) 40 MG tablet TAKE ONE TABLET BY MOUTH ONCE DAILY 03/26/14   Vesta Mixer, MD  omeprazole (PRILOSEC OTC) 20 MG tablet Take 20 mg by mouth daily.    Historical Provider, MD  oxyCODONE-acetaminophen (PERCOCET) 5-325 MG tablet Take 1 tablet by mouth every 4 (four) hours as needed for  moderate pain. 01/06/15   Dione Booze, MD  potassium chloride SA (K-DUR,KLOR-CON) 20 MEQ tablet Take 1 tablet (20 mEq total) by mouth 2 (two) times daily. 01/06/15   Dione Booze, MD  sildenafil (REVATIO) 20 MG tablet Take 1 tablet (20 mg total) by mouth 3 (three) times daily as needed. 10/10/14   Vesta Mixer, MD  triamterene-hydrochlorothiazide (MAXZIDE) 75-50 MG per tablet Take 1 tablet by mouth daily. 03/07/14   Vesta Mixer, MD   BP 155/101 mmHg  Pulse 82  Temp(Src) 98.2 F (36.8 C) (Oral)  Resp 20   SpO2 99% Physical Exam  Constitutional: He is oriented to person, place, and time. He appears well-developed and well-nourished.  HENT:  Head: Normocephalic and atraumatic.  Right Ear: External ear normal.  Left Ear: External ear normal.  Nose: Nose normal.  Mouth/Throat: Oropharynx is clear and moist. No oropharyngeal exudate.  Eyes: Right eye exhibits no discharge. Left eye exhibits no discharge.  Neck: Neck supple.  Cardiovascular: Normal rate, regular rhythm, normal heart sounds and intact distal pulses.   Pulmonary/Chest: Effort normal and breath sounds normal.  Abdominal: Soft. He exhibits no distension. There is no tenderness.  Musculoskeletal: He exhibits no edema.  No edema/swelling or pain to lower extremities  Neurological: He is alert and oriented to person, place, and time.  Skin: Skin is warm and dry. He is not diaphoretic.  Nursing note and vitals reviewed.   ED Course  Procedures (including critical care time) Labs Review Labs Reviewed  COMPREHENSIVE METABOLIC PANEL - Abnormal; Notable for the following:    Glucose, Bld 109 (*)    Creatinine, Ser 1.27 (*)    All other components within normal limits  CBC WITH DIFFERENTIAL/PLATELET    Imaging Review No results found. I have personally reviewed and evaluated these images and lab results as part of my medical decision-making.   EKG Interpretation None      MDM   Final diagnoses:  Weight loss    Patient's weight loss is probably from increased physical activity, better habits, and possibly from poor sleep. I highly doubt it is pathologic. He does not have concerning risk factor such as night sweats, smoking history, or severe alcohol abuse. No focal findings on exam. Lab work here is unremarkable, creatinine is mildly improved from baseline. At this point there is no emergent pathology present, but I did discuss this is not a complete workup and he will need to establish PCP care with further outpatient  workup.    Pricilla Loveless, MD 02/17/15 [id number]

## 2015-02-17 NOTE — ED Notes (Signed)
Per pt report: for the past week, when the pt eats, he hasn't been feeling full.  Pt reports that over the past two months, pt has lost 12 pounds.  Pt started a second full time jobs 3 months ago.  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  Pt hx sleep apnea. Pt reports decrease appetite.

## 2015-03-04 ENCOUNTER — Other Ambulatory Visit: Payer: Self-pay | Admitting: *Deleted

## 2015-03-04 MED ORDER — DILTIAZEM HCL ER COATED BEADS 120 MG PO CP24: 120.0000 mg | ORAL_CAPSULE | Freq: Every day | ORAL | Status: DC

## 2015-04-02 ENCOUNTER — Other Ambulatory Visit: Payer: Self-pay | Admitting: *Deleted

## 2015-04-02 MED ORDER — LISINOPRIL 40 MG PO TABS
40.0000 mg | ORAL_TABLET | Freq: Every day | ORAL | Status: DC
Start: 2015-04-02 — End: 2015-10-04

## 2015-04-03 ENCOUNTER — Encounter: Payer: Self-pay | Admitting: Cardiovascular Disease

## 2015-04-03 ENCOUNTER — Ambulatory Visit (INDEPENDENT_AMBULATORY_CARE_PROVIDER_SITE_OTHER): Payer: Managed Care, Other (non HMO) | Admitting: Cardiovascular Disease

## 2015-04-03 ENCOUNTER — Other Ambulatory Visit (INDEPENDENT_AMBULATORY_CARE_PROVIDER_SITE_OTHER): Payer: Managed Care, Other (non HMO) | Admitting: *Deleted

## 2015-04-03 VITALS — BP 150/102 | HR 80 | Ht 72.0 in | Wt 250.6 lb

## 2015-04-03 DIAGNOSIS — Z13220 Encounter for screening for lipoid disorders: Secondary | ICD-10-CM

## 2015-04-03 DIAGNOSIS — G4733 Obstructive sleep apnea (adult) (pediatric): Secondary | ICD-10-CM | POA: Diagnosis not present

## 2015-04-03 DIAGNOSIS — I10 Essential (primary) hypertension: Secondary | ICD-10-CM

## 2015-04-03 MED ORDER — TRIAMTERENE-HCTZ 75-50 MG PO TABS: 1.0000 | ORAL_TABLET | Freq: Every day | ORAL | Status: DC

## 2015-04-03 NOTE — Patient Instructions (Signed)

## 2015-04-03 NOTE — Progress Notes (Signed)
Cardiology Office Note   Date:  04/03/2015   ID:  Nicholas Hughes Sada, DOB [birth year], MRN [id number]  PCP:  No PCP Per Patient  Cardiologist:   Vesta MixerNahser, Makaylie Dedeaux J, MD   Chief Complaint  Patient presents with  . Follow-up    dents in the skin, per pt   Problem list: 1. Hypertension 2. Left ventricular Hypertrophy 3. Transient atrial fibrillation   History of Present Illness: Nicholas Hughes Nicholas Hughes is a 45 y.o. male who presents for follow up of his HTN. He has not been having any CP or dyspnea. Eating better , does eat chicken wings every other Wednesday.   We performed an echo card gram back in 2011 which showed normal left ventricular systolic function. Mild mitral regurgitation.  No palpitations. Works for [employer] ( now has changed name to [employer] )   July 27, 2014:   Doing well . Reduced sex drive  Sept. 14, [id number]: Doing well BP is a bit high. He's been working out on a regular basis. 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   Has been overeating recently .    Has lost 10 labs since last year.  Has cut out his salt.   Eating lots of fruits.   Drinking protein shakes ( as a snack, not as a meal )  Is still trying to get his CPAP machine   Past Medical History  Diagnosis Date  . Hypertension   . GERD (gastroesophageal reflux disease)   . LVH (left ventricular hypertrophy)   . Transient atrial fibrillation or flutter   . ED (erectile dysfunction)   . Renal insufficiency   . OSA (obstructive sleep apnea) 07/14/2014    Moderate OSA with AHI 20/hr    Past Surgical History  Procedure Laterality Date  . Transthoracic echocardiogram  05/07/2009    EF 60-65%     Current Outpatient Prescriptions  Medication Sig Dispense Refill  . aspirin 325 MG tablet Take 325 mg by mouth daily.      [name]. diltiazem (CARDIZEM CD) 120 MG 24 hr capsule Take 1 capsule (120 mg total) by mouth  daily. 30 capsule 6  . lisinopril (PRINIVIL,ZESTRIL) 40 MG tablet Take 1 tablet (40 mg total) by mouth daily. 30 tablet 5  . omeprazole (PRILOSEC OTC) 20 MG tablet Take 20 mg by mouth daily as needed (for acid reflux).     . triamterene-hydrochlorothiazide (MAXZIDE) 75-50 MG per tablet Take 1 tablet by mouth daily. 90 tablet 3   No current facility-administered medications for this visit.    Allergies:   Review of patient's allergies indicates no known allergies.    Social History:  The patient  reports that he has never smoked. He has never used smokeless tobacco. He reports that he drinks alcohol. He reports that he does not use illicit drugs.   Family History:  The patient's family history includes Coronary artery disease in his father; Hypertension in his father.    ROS:  Please see the history of present illness.    Review of Systems: Constitutional:  denies fever, chills, diaphoresis, appetite change and fatigue.  HEENT: denies photophobia, eye pain, redness, hearing loss, ear pain, congestion, sore throat, rhinorrhea, sneezing, neck pain, neck stiffness and tinnitus.  Respiratory: denies SOB, DOE, cough, chest tightness, and wheezing.  Cardiovascular: denies chest pain, palpitations and leg swelling.  Gastrointestinal: denies nausea, vomiting, abdominal pain,  diarrhea, constipation, blood in stool.  Genitourinary: denies dysuria, urgency, frequency, hematuria, flank pain and difficulty urinating.  Musculoskeletal: denies  myalgias, back pain, joint swelling, arthralgias and gait problem.   Skin: denies pallor, rash and wound.  Neurological: denies dizziness, seizures, syncope, weakness, light-headedness, numbness and headaches.   Hematological: denies adenopathy, easy bruising, personal or family bleeding history.  Psychiatric/ Behavioral: denies suicidal ideation, mood changes, confusion, nervousness, sleep disturbance and agitation.       All other systems are reviewed and  negative.    PHYSICAL EXAM: VS:  BP 150/102 mmHg  Pulse 80  Ht 6' (1.829 m)  Wt 250 lb 9.6 oz (113.671 kg)  BMI 33.98 kg/m2  SpO2 96% , BMI Body mass index is 33.98 kg/(m^2). GEN: Well nourished, well developed, in no acute distress HEENT: normal Neck: no JVD, carotid bruits, or masses Cardiac: RRR; no murmurs, rubs, or gallops,no edema  Respiratory:  clear to auscultation bilaterally, normal work of breathing GI: soft, nontender, nondistended, + BS MS: no deformity or atrophy Skin: warm and dry, no rash Neuro:  Strength and sensation are intact Psych: normal   EKG:  EKG is ordered today. The ekg ordered today demonstrates NSR at 69.  LVH with repol abn.    Recent Labs: 02/17/2015: ALT 24; BUN 17; Creatinine, Ser 1.27*; Hemoglobin 13.6; Platelets 296; Potassium 3.8; Sodium 139    Lipid Panel    Component Value Date/Time   CHOL 158 03/07/2014 0923   TRIG 94.0 03/07/2014 0923   HDL 45.80 03/07/2014 0923   CHOLHDL 3 03/07/2014 0923   VLDL 18.8 03/07/2014 0923   LDLCALC 93 03/07/2014 0923      Wt Readings from Last 3 Encounters:  04/03/15 250 lb 9.6 oz (113.671 kg)  01/06/15 260 lb (117.935 kg)  10/10/14 258 lb 1.9 oz (117.082 kg)      Other studies Reviewed: Additional studies/ records that were reviewed today include: . Review of the above records demonstrates:    ASSESSMENT AND PLAN:  1. Hypertension-  Howie  with seems to be doing okay.   BP is better. Continue current meds.   2. Left ventricular Hypertrophy - stable   3. Transient atrial fibrillation- maintaining NSR.  Continue dilt    4.   Suspected sleep apnea:  Has OSA.  Going to get his CPAP - but needs additional information   5. ED - will write for Sildinifil 20 mg tabs, #50.   Current medicines are reviewed at length with the patient today.  The patient does not have concerns regarding medicines.  The following changes have been made:  Increase Maxzide   Disposition:   FU with me in 6  months  For OV and fasting labs.    Stacey Maura, Deloris Ping, MD  04/03/2015 8:33 AM    [facility] [address] [place], [place], [place]  [address] Phone: [phone]; Fax: [phone]

## 2015-04-18 ENCOUNTER — Ambulatory Visit: Payer: BLUE CROSS/BLUE SHIELD | Admitting: Cardiovascular Disease

## 2015-04-18 ENCOUNTER — Other Ambulatory Visit: Payer: BLUE CROSS/BLUE SHIELD

## 2015-09-23 ENCOUNTER — Encounter: Payer: Self-pay | Admitting: Physician Assistant

## 2015-10-04 ENCOUNTER — Other Ambulatory Visit: Payer: Self-pay | Admitting: Cardiovascular Disease

## 2015-10-08 ENCOUNTER — Ambulatory Visit: Payer: Managed Care, Other (non HMO) | Admitting: Physician Assistant

## 2015-10-09 ENCOUNTER — Encounter: Payer: Self-pay | Admitting: Physician Assistant

## 2015-11-02 ENCOUNTER — Other Ambulatory Visit: Payer: Self-pay | Admitting: Cardiovascular Disease

## 2015-11-04 ENCOUNTER — Other Ambulatory Visit: Payer: Self-pay | Admitting: *Deleted

## 2015-11-04 MED ORDER — DILTIAZEM HCL ER COATED BEADS 120 MG PO CP24: 120.0000 mg | ORAL_CAPSULE | Freq: Every day | ORAL | 4 refills | Status: DC

## 2015-11-06 ENCOUNTER — Encounter (INDEPENDENT_AMBULATORY_CARE_PROVIDER_SITE_OTHER): Payer: Self-pay

## 2015-11-06 ENCOUNTER — Ambulatory Visit (INDEPENDENT_AMBULATORY_CARE_PROVIDER_SITE_OTHER): Payer: Managed Care, Other (non HMO) | Admitting: Physician Assistant

## 2015-11-06 ENCOUNTER — Encounter: Payer: Self-pay | Admitting: Physician Assistant

## 2015-11-06 VITALS — BP 136/100 | HR 65 | Ht 72.0 in | Wt 256.8 lb

## 2015-11-06 DIAGNOSIS — G4733 Obstructive sleep apnea (adult) (pediatric): Secondary | ICD-10-CM | POA: Diagnosis not present

## 2015-11-06 DIAGNOSIS — I480 Paroxysmal atrial fibrillation: Secondary | ICD-10-CM | POA: Diagnosis not present

## 2015-11-06 DIAGNOSIS — I10 Essential (primary) hypertension: Secondary | ICD-10-CM

## 2015-11-06 MED ORDER — TRIAMTERENE-HCTZ 75-50 MG PO TABS: 1.0000 | ORAL_TABLET | Freq: Every day | ORAL | 3 refills | Status: DC

## 2015-11-06 NOTE — Progress Notes (Signed)
Cardiology Office Note:    Date:  11/06/2015   ID:  Nicholas Hughes, DOB May 04, 1970, MRN [id number]  PCP:  No PCP Per Patient  Cardiologist:  Dr. Delane GingerPhil Nahser  Electrophysiologist:  n/a  Referring MD: No ref. provider found   Chief Complaint  Patient presents with  . Follow-up    Blood pressure    History of Present Illness:    Nicholas Hughes is a 45 y.o. male with a hx of HTN, LVH, PAF, OSA.  Last seen by Dr. Delane GingerPhil Nahser in 3/17.  Returns for FU.  Here alone today. He has been doing well.  The patient denies chest pain, shortness of breath, syncope, orthopnea, PND or significant pedal edema.  He tries to limit his sodium.    Prior CV studies that were reviewed today include:    Echo 4/11 EF 60-65, mild MR, mild LAE, PASP 31  Past Medical History:  Diagnosis Date  . ED (erectile dysfunction)   . GERD (gastroesophageal reflux disease)   . Hypertension   . LVH (left ventricular hypertrophy)   . OSA (obstructive sleep apnea) 07/14/2014   Moderate OSA with AHI 20/hr  . Renal insufficiency   . Transient atrial fibrillation or flutter     Past Surgical History:  Procedure Laterality Date  . TRANSTHORACIC ECHOCARDIOGRAM  05/07/2009   EF 60-65%    Current Medications: Current Meds  Medication Sig  . aspirin 325 MG tablet Take 325 mg by mouth daily.    [name]. diltiazem (CARDIZEM CD) 120 MG 24 hr capsule Take 1 capsule (120 mg total) by mouth daily.  [name]. lisinopril (PRINIVIL,ZESTRIL) 40 MG tablet TAKE ONE TABLET BY MOUTH ONCE DAILY  . omeprazole (PRILOSEC OTC) 20 MG tablet Take 20 mg by mouth daily as needed (for acid reflux).   . triamterene-hydrochlorothiazide (MAXZIDE) 75-50 MG tablet Take 1 tablet by mouth daily.  . [DISCONTINUED] triamterene-hydrochlorothiazide (MAXZIDE) 75-50 MG tablet Take 1 tablet by mouth daily.     Allergies:   Review of patient's allergies indicates no known allergies.   Social History   Social History  . Marital status: Single    Spouse name:  N/A  . Number of children: N/A  . Years of education: N/A   Social History Main Topics  . Smoking status: Never Smoker  . Smokeless tobacco: Never Used  . Alcohol use Yes     Comment: occasionally  . Drug use: No  . Sexual activity: Not Asked   Other Topics Concern  . None   Social History Narrative  . None     Family History:  The patient's family history includes Coronary artery disease in his father; Hypertension in his father.   ROS:   Please see the history of present illness.    ROS All other systems reviewed and are negative.   EKGs/Labs/Other Test Reviewed:    EKG:  EKG is  ordered today.  The ekg ordered today demonstrates NSR, HR 65, normal axis, LVH, TWI in 2, 3, aVF, V3-6, no change since 04/03/15  Recent Labs: 02/17/2015: ALT 24; BUN 17; Creatinine, Ser 1.27; Hemoglobin 13.6; Platelets 296; Potassium 3.8; Sodium 139   Recent Lipid Panel    Component Value Date/Time   CHOL 158 03/07/2014 0923   TRIG 94.0 03/07/2014 0923   HDL 45.80 03/07/2014 0923   CHOLHDL 3 03/07/2014 0923   VLDL 18.8 03/07/2014 0923   LDLCALC 93 03/07/2014 0923     Physical Exam:    VS:  BP (!) 136/100 (  BP Location: Right Arm, Patient Position: Sitting, Cuff Size: Large)   Pulse 65   Ht 6' (1.829 m)   Wt 256 lb 12.8 oz (116.5 kg)   BMI 34.83 kg/m     Wt Readings from Last 3 Encounters:  11/06/15 256 lb 12.8 oz (116.5 kg)  04/03/15 250 lb 9.6 oz (113.7 kg)  01/06/15 260 lb (117.9 kg)     Physical Exam  Constitutional: He is oriented to person, place, and time. He appears well-developed and well-nourished. No distress.  HENT:  Head: Normocephalic and atraumatic.  Eyes: No scleral icterus.  Neck: Normal range of motion. No JVD present.  Cardiovascular: Normal rate, regular rhythm, S1 normal and S2 normal.   No murmur heard. Pulmonary/Chest: Effort normal and breath sounds normal. He has no wheezes. He has no rhonchi. He has no rales.  Abdominal: Soft. There is no  tenderness.  Musculoskeletal: He exhibits no edema.  Neurological: He is alert and oriented to person, place, and time.  Skin: Skin is warm and dry.  Psychiatric: He has a normal mood and affect.    ASSESSMENT:    1. Essential hypertension   2. Intermittent atrial fibrillation (HCC)   3. OSA (obstructive sleep apnea)    PLAN:    In order of problems listed above:  1. HTN - BP is uncontrolled.  He had side effects to Coreg and this was stopped. He also notes dizziness with higher dose of Diltiazem.  He is on max dose Lisinopril.  He only takes 1/2 tab of the Maxzide.  He also has not gotten his CPAP device yet but his insurance changes over soon.    -  Increase Maxzide to 75/50 mg QD  -  BMET 1 week  -  FU with me or Dr. Elease Hashimoto in 1 month  -  Consider Clonidine or Hydralazine if above target.  -  If BP close to target at FU, will wait for OSA tx first before making adjustments.  2. PAF - CHADS2-VASc=1.  No recurrence. He remains on ASA 325 QD.  3. OSA - No CPAP yet.  Hopefully, once OSA is treated, his blood pressure will improve.     Medication Adjustments/Labs and Tests Ordered: Current medicines are reviewed at length with the patient today.  Concerns regarding medicines are outlined above.  Medication changes, Labs and Tests ordered today are outlined in the Patient Instructions noted below. Patient Instructions  Medication Instructions:  Increase Maxzide to 1 tablet (75/50 mg) Once daily   Labwork: You have a lab appointment on Wednesday, October 18. You may come any time between 7:30AM and 5:00 PM and you do NOT need to be fasting. - BMET  Testing/Procedures: None   Follow-Up: You have a follow-up appointment with Tereso Newcomer, PA on Monday, November 13 at 8:14 AM.    Any Other Special Instructions Will Be Listed Below (If Applicable).  If you need a refill on your cardiac medications before your next appointment, please call your pharmacy.   Signed, Tereso Newcomer, PA-C  11/06/2015 10:06 AM    [facility] [address] [place], [place], [place]  [address] Phone: [phone]; Fax: [phone]

## 2015-11-06 NOTE — Patient Instructions (Addendum)
Medication Instructions:  Increase Maxzide to 1 tablet (75/50 mg) Once daily   Labwork: You have a lab appointment on Wednesday, October 18. You may come any time between 7:30AM and 5:00 PM and you do NOT need to be fasting. - BMET  Testing/Procedures: None   Follow-Up: You have a follow-up appointment with Tereso NewcomerScott Aryanna Shaver, PA on Monday, November 13 at 8:14 AM.    Any Other Special Instructions Will Be Listed Below (If Applicable).  If you need a refill on your cardiac medications before your next appointment, please call your pharmacy.

## 2015-11-13 ENCOUNTER — Other Ambulatory Visit: Payer: Managed Care, Other (non HMO) | Admitting: *Deleted

## 2015-11-13 ENCOUNTER — Telehealth: Payer: Self-pay | Admitting: *Deleted

## 2015-11-13 DIAGNOSIS — I480 Paroxysmal atrial fibrillation: Secondary | ICD-10-CM

## 2015-11-13 DIAGNOSIS — I10 Essential (primary) hypertension: Secondary | ICD-10-CM

## 2015-11-13 LAB — BASIC METABOLIC PANEL
BUN: 20 mg/dL (ref 7–25)
CHLORIDE: 100 mmol/L (ref 98–110)
CO2: 28 mmol/L (ref 20–31)
CREATININE: 1.57 mg/dL — AB (ref 0.60–1.35)
Calcium: 9.5 mg/dL (ref 8.6–10.3)
Glucose, Bld: 115 mg/dL — ABNORMAL HIGH (ref 65–99)
POTASSIUM: 4.1 mmol/L (ref 3.5–5.3)
Sodium: 136 mmol/L (ref 135–146)

## 2015-11-13 NOTE — Telephone Encounter (Signed)
DPR ok to lmom . Lmom lab work ok, kidney function stable, K+ normal, glucose mildy elevated. Any question feel free tcb [phone]

## 2015-12-08 NOTE — Progress Notes (Deleted)
Cardiology Office Note:    Date:  12/08/2015   ID:  Nicholas Hughes, DOB 09-Oct-1970, MRN [id number]  PCP:  No PCP Per Patient  Cardiologist:  Dr. Delane GingerPhil Nahser   Electrophysiologist:  n/a  Referring MD: No ref. provider found   No chief complaint on file. ***  History of Present Illness:    Nicholas Hughes is a 45 y.o. male with a hx of HTN, LVH, PAF, OSA.  Last seen by me 11/06/15.    Prior CV studies that were reviewed today include:    Echo 4/11 EF 60-65, mild MR, mild LAE, PASP 31  Past Medical History:  Diagnosis Date  . ED (erectile dysfunction)   . GERD (gastroesophageal reflux disease)   . Hypertension   . LVH (left ventricular hypertrophy)   . OSA (obstructive sleep apnea) 07/14/2014   Moderate OSA with AHI 20/hr  . Renal insufficiency   . Transient atrial fibrillation or flutter     Past Surgical History:  Procedure Laterality Date  . TRANSTHORACIC ECHOCARDIOGRAM  05/07/2009   EF 60-65%    Current Medications: No outpatient prescriptions have been marked as taking for the 12/09/15 encounter (Appointment) with Beatrice LecherScott T Marleah Beever, PA-C.     Allergies:   Patient has no known allergies.   Social History   Social History  . Marital status: Single    Spouse name: N/A  . Number of children: N/A  . Years of education: N/A   Social History Main Topics  . Smoking status: Never Smoker  . Smokeless tobacco: Never Used  . Alcohol use Yes     Comment: occasionally  . Drug use: No  . Sexual activity: Not on file   Other Topics Concern  . Not on file   Social History Narrative  . No narrative on file     Family History:  The patient's ***family history includes Coronary artery disease in his father; Hypertension in his father.   ROS:   Please see the history of present illness.    ROS All other systems reviewed and are negative.   EKGs/Labs/Other Test Reviewed:    EKG:  EKG is *** ordered today.  The ekg ordered today demonstrates ***  Recent  Labs: 02/17/2015: ALT 24; Hemoglobin 13.6; Platelets 296 11/13/2015: BUN 20; Creat 1.57; Potassium 4.1; Sodium 136   Recent Lipid Panel    Component Value Date/Time   CHOL 158 03/07/2014 0923   TRIG 94.0 03/07/2014 0923   HDL 45.80 03/07/2014 0923   CHOLHDL 3 03/07/2014 0923   VLDL 18.8 03/07/2014 0923   LDLCALC 93 03/07/2014 0923     Physical Exam:    VS:  There were no vitals taken for this visit.    Wt Readings from Last 3 Encounters:  11/06/15 256 lb 12.8 oz (116.5 kg)  04/03/15 250 lb 9.6 oz (113.7 kg)  01/06/15 260 lb (117.9 kg)     ***Physical Exam  ASSESSMENT:    No diagnosis found. PLAN:    In order of problems listed above:  1. HTN - ***             -  Consider Clonidine or Hydralazine if above target.*** 2. PAF - CHADS2-VASc=1.  No*** recurrence. He remains on ASA 325 QD. 3. OSA - ***   Medication Adjustments/Labs and Tests Ordered: Current medicines are reviewed at length with the patient today.  Concerns regarding medicines are outlined above.  Medication changes, Labs and Tests ordered today are outlined in the Patient Instructions  noted below. There are no Patient Instructions on file for this visit. Signed, Tereso NewcomerScott Martena Emanuele, PA-C  12/08/2015 9:17 PM    [facility] [address], [place], [place]  [address] Phone: [phone]; Fax: [phone]

## 2015-12-09 ENCOUNTER — Ambulatory Visit: Payer: Managed Care, Other (non HMO) | Admitting: Physician Assistant

## 2015-12-18 ENCOUNTER — Encounter: Payer: Self-pay | Admitting: Physician Assistant

## 2016-03-11 ENCOUNTER — Telehealth: Payer: Self-pay | Admitting: Cardiovascular Disease

## 2016-03-11 ENCOUNTER — Other Ambulatory Visit: Payer: Self-pay | Admitting: *Deleted

## 2016-03-11 MED ORDER — DILTIAZEM HCL ER COATED BEADS 120 MG PO CP24: 120.0000 mg | ORAL_CAPSULE | Freq: Every day | ORAL | 0 refills | Status: DC

## 2016-03-11 MED ORDER — LISINOPRIL 40 MG PO TABS: 40.0000 mg | ORAL_TABLET | Freq: Every day | ORAL | 0 refills | Status: DC

## 2016-03-11 NOTE — Telephone Encounter (Signed)
°*  STAT* If patient is at the pharmacy, call can be transferred to refill team.   1. Which medications need to be refilled? (please list name of each medication and dose if known) Lisinopril 40 mg Diltiazem, 120 mg  2. Which pharmacy/location (including street and city if local pharmacy) is medication to be sent to? [facility] - [place], [place] - [id number]   3. Do they need a 30 day or 90 day supply? 30 day

## 2016-03-27 ENCOUNTER — Ambulatory Visit (INDEPENDENT_AMBULATORY_CARE_PROVIDER_SITE_OTHER): Payer: Managed Care, Other (non HMO) | Admitting: Cardiovascular Disease

## 2016-03-27 ENCOUNTER — Encounter (INDEPENDENT_AMBULATORY_CARE_PROVIDER_SITE_OTHER): Payer: Self-pay

## 2016-03-27 ENCOUNTER — Encounter: Payer: Self-pay | Admitting: Cardiovascular Disease

## 2016-03-27 VITALS — BP 126/96 | HR 76 | Ht 72.0 in | Wt 261.0 lb

## 2016-03-27 DIAGNOSIS — I10 Essential (primary) hypertension: Secondary | ICD-10-CM

## 2016-03-27 MED ORDER — CARVEDILOL 3.125 MG PO TABS: 3.1250 mg | ORAL_TABLET | Freq: Two times a day (BID) | ORAL | 11 refills | Status: DC

## 2016-03-27 NOTE — Patient Instructions (Signed)
Medication Instructions:  START Carvedilol (Coreg) 3.125 mg twice daily   Labwork: TODAY - basic metabolic panel   Testing/Procedures: None Ordered   Follow-Up: Your physician recommends that you schedule a follow-up appointment in: 3 months with Dr. Elease HashimotoNahser   If you need a refill on your cardiac medications before your next appointment, please call your pharmacy.   Thank you for choosing [facility]! Eligha BridegroomMichelle Gwendolin Briel, RN [phone]

## 2016-03-27 NOTE — Progress Notes (Signed)
Cardiology Office Note   Date:  03/27/2016   ID:  Nicholas BridegroomDellwood Hughes, DOB 01-12-1971, MRN [id number]  PCP:  No PCP Per Patient  Cardiologist:   Kristeen MissPhilip Lamarkus Nebel, MD   Chief Complaint  Patient presents with  . Hypertension   Problem list: 1. Hypertension 2. Left ventricular Hypertrophy 3. Transient atrial fibrillation   History of Present Illness: Nicholas Hughes is a 46 y.o. male who presents for follow up of his HTN. He has not been having any CP or dyspnea. Eating better , does eat chicken wings every other Wednesday.   We performed an echo card gram back in 2011 which showed normal left ventricular systolic function. Mild mitral regurgitation.  No palpitations. Works for [employer] ( now has changed name to [employer] )   July 27, 2014:   Doing well . Reduced sex drive  Sept. 14, [id number]: Doing well BP is a bit high. He's been working out on a regular basis. 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   Has been overeating recently .    Has lost 10 labs since last year.  Has cut out his salt.   Eating lots of fruits.   Drinking protein shakes ( as a snack, not as a meal )  Is still trying to get his CPAP machine   March 27, 2016:  Doing well  BP has been well controlled  Eating better. Exercising well.    Past Medical History:  Diagnosis Date  . ED (erectile dysfunction)   . GERD (gastroesophageal reflux disease)   . Hypertension   . LVH (left ventricular hypertrophy)   . OSA (obstructive sleep apnea) 07/14/2014   Moderate OSA with AHI 20/hr  . Renal insufficiency   . Transient atrial fibrillation or flutter     Past Surgical History:  Procedure Laterality Date  . TRANSTHORACIC ECHOCARDIOGRAM  05/07/2009   EF 60-65%     Current Outpatient Prescriptions  Medication Sig Dispense Refill  . aspirin 325 MG tablet Take 325 mg by mouth daily.      [name]. diltiazem  (CARDIZEM CD) 120 MG 24 hr capsule Take 1 capsule (120 mg total) by mouth daily. 30 capsule 0  . lisinopril (PRINIVIL,ZESTRIL) 40 MG tablet Take 1 tablet (40 mg total) by mouth daily. 30 tablet 0  . omeprazole (PRILOSEC OTC) 20 MG tablet Take 20 mg by mouth daily as needed (for acid reflux).     . triamterene-hydrochlorothiazide (MAXZIDE) 75-50 MG tablet Take 1 tablet by mouth daily. 90 tablet 3   No current facility-administered medications for this visit.     Allergies:   Patient has no known allergies.    Social History:  The patient  reports that he has never smoked. He has never used smokeless tobacco. He reports that he drinks alcohol. He reports that he does not use drugs.   Family History:  The patient's family history includes Coronary artery disease in his father; Hypertension in his father.    ROS:  Please see the history of present illness.    Review of Systems: Constitutional:  denies fever, chills, diaphoresis, appetite change and fatigue.  HEENT: denies photophobia, eye pain, redness, hearing loss, ear pain, congestion, sore throat, rhinorrhea, sneezing, neck pain, neck stiffness and tinnitus.  Respiratory: denies SOB, DOE, cough, chest tightness, and wheezing.  Cardiovascular: denies chest pain, palpitations and leg swelling.  Gastrointestinal: denies  nausea, vomiting, abdominal pain, diarrhea, constipation, blood in stool.  Genitourinary: denies dysuria, urgency, frequency, hematuria, flank pain and difficulty urinating.  Musculoskeletal: denies  myalgias, back pain, joint swelling, arthralgias and gait problem.   Skin: denies pallor, rash and wound.  Neurological: denies dizziness, seizures, syncope, weakness, light-headedness, numbness and headaches.   Hematological: denies adenopathy, easy bruising, personal or family bleeding history.  Psychiatric/ Behavioral: denies suicidal ideation, mood changes, confusion, nervousness, sleep disturbance and agitation.        All other systems are reviewed and negative.    PHYSICAL EXAM: VS:  BP (!) 126/96 (BP Location: Left Arm, Patient Position: Sitting, Cuff Size: Large)   Pulse 76   Ht 6' (1.829 m)   Wt 261 lb (118.4 kg)   SpO2 97%   BMI 35.40 kg/m  , BMI Body mass index is 35.4 kg/m. GEN: Well nourished, well developed, in no acute distress  HEENT: normal  Neck: no JVD, carotid bruits, or masses Cardiac: RRR; no murmurs, rubs, or gallops,no edema  Respiratory:  clear to auscultation bilaterally, normal work of breathing GI: soft, nontender, nondistended, + BS MS: no deformity or atrophy  Skin: warm and dry, no rash Neuro:  Strength and sensation are intact Psych: normal   EKG:  EKG is ordered today. The ekg ordered today demonstrates NSR at 69.  LVH with repol abn.    Recent Labs: 11/13/2015: BUN 20; Creat 1.57; Potassium 4.1; Sodium 136    Lipid Panel    Component Value Date/Time   CHOL 158 03/07/2014 0923   TRIG 94.0 03/07/2014 0923   HDL 45.80 03/07/2014 0923   CHOLHDL 3 03/07/2014 0923   VLDL 18.8 03/07/2014 0923   LDLCALC 93 03/07/2014 0923      Wt Readings from Last 3 Encounters:  03/27/16 261 lb (118.4 kg)  11/06/15 256 lb 12.8 oz (116.5 kg)  04/03/15 250 lb 9.6 oz (113.7 kg)      Other studies Reviewed: Additional studies/ records that were reviewed today include: . Review of the above records demonstrates:    ASSESSMENT AND PLAN:  1. Hypertension-  [place]  with seems to be doing okay.   BP is better but still a little elevated.  Will add coreg 3.125 mg BID  Will see him back in 3 months.  2. Left ventricular Hypertrophy - stable   3. Transient atrial fibrillation- maintaining NSR.  Continue dilt    4.   Suspected sleep apnea:  Has OSA.  Going to get his CPAP - but needs additional information   5. ED - will write for Sildinifil 20 mg tabs, #50.   Current medicines are reviewed at length with the patient today.  The patient does not have concerns  regarding medicines.  The following changes have been made:  Increase Maxzide   Disposition:   FU with me in 6 months  For OV and fasting labs.    Kristeen Miss, MD  03/27/2016 2:13 PM    [facility] [address] [place], [place], [place]  [address] Phone: [phone]; Fax: [phone]

## 2016-03-28 LAB — BASIC METABOLIC PANEL
BUN/Creatinine Ratio: 15 (ref 9–20)
BUN: 20 mg/dL (ref 6–24)
CHLORIDE: 97 mmol/L (ref 96–106)
CO2: 26 mmol/L (ref 18–29)
Calcium: 9.7 mg/dL (ref 8.7–10.2)
Creatinine, Ser: 1.36 mg/dL — ABNORMAL HIGH (ref 0.76–1.27)
GFR calc Af Amer: 72 mL/min/{1.73_m2} (ref >59)
GFR, EST NON AFRICAN AMERICAN: 62 mL/min/{1.73_m2} (ref >59)
GLUCOSE: 82 mg/dL (ref 65–99)
POTASSIUM: 3.9 mmol/L (ref 3.5–5.2)
SODIUM: 137 mmol/L (ref 134–144)

## 2016-05-03 ENCOUNTER — Other Ambulatory Visit: Payer: Self-pay | Admitting: Cardiovascular Disease

## 2016-05-04 ENCOUNTER — Other Ambulatory Visit: Payer: Self-pay | Admitting: Cardiovascular Disease

## 2016-06-04 ENCOUNTER — Telehealth: Payer: Self-pay | Admitting: Cardiovascular Disease

## 2016-06-04 MED ORDER — LISINOPRIL 40 MG PO TABS: 40.0000 mg | ORAL_TABLET | Freq: Every day | ORAL | 0 refills | Status: DC

## 2016-06-04 MED ORDER — TRIAMTERENE-HCTZ 75-50 MG PO TABS: 1.0000 | ORAL_TABLET | Freq: Every day | ORAL | 0 refills | Status: DC

## 2016-06-04 MED ORDER — DILTIAZEM HCL ER COATED BEADS 120 MG PO CP24: 120.0000 mg | ORAL_CAPSULE | Freq: Every day | ORAL | 0 refills | Status: DC

## 2016-06-04 NOTE — Telephone Encounter (Signed)
New message     *STAT* If patient is at the pharmacy, call can be transferred to refill team.   1. Which medications need to be refilled? (please list name of each medication and dose if known) lisinopril, carvedilol, Cartia, maxzide  2. Which pharmacy/location (including street and city if local pharmacy) is medication to be sent to? [facility] on [address]   3. Do they need a 30 day or 90 day supply? 30 day

## 2016-06-30 ENCOUNTER — Encounter: Payer: Self-pay | Admitting: Cardiovascular Disease

## 2016-06-30 ENCOUNTER — Ambulatory Visit (INDEPENDENT_AMBULATORY_CARE_PROVIDER_SITE_OTHER): Payer: Managed Care, Other (non HMO) | Admitting: Cardiovascular Disease

## 2016-06-30 VITALS — BP 136/82 | HR 67 | Ht 72.0 in | Wt 264.2 lb

## 2016-06-30 DIAGNOSIS — I10 Essential (primary) hypertension: Secondary | ICD-10-CM

## 2016-06-30 DIAGNOSIS — G4733 Obstructive sleep apnea (adult) (pediatric): Secondary | ICD-10-CM

## 2016-06-30 MED ORDER — DILTIAZEM HCL ER COATED BEADS 120 MG PO CP24: 120.0000 mg | ORAL_CAPSULE | Freq: Every day | ORAL | 0 refills | Status: DC

## 2016-06-30 MED ORDER — LISINOPRIL 40 MG PO TABS: 40.0000 mg | ORAL_TABLET | Freq: Every day | ORAL | 3 refills | Status: DC

## 2016-06-30 MED ORDER — CARVEDILOL 3.125 MG PO TABS: 3.1250 mg | ORAL_TABLET | Freq: Two times a day (BID) | ORAL | 3 refills | Status: DC

## 2016-06-30 MED ORDER — TRIAMTERENE-HCTZ 75-50 MG PO TABS: 1.0000 | ORAL_TABLET | Freq: Every day | ORAL | 3 refills | Status: DC

## 2016-06-30 MED ORDER — ASPIRIN EC 81 MG PO TBEC: 81.0000 mg | DELAYED_RELEASE_TABLET | Freq: Every day | ORAL | Status: DC

## 2016-06-30 MED ORDER — OMEPRAZOLE MAGNESIUM 20 MG PO TBEC: 20.0000 mg | DELAYED_RELEASE_TABLET | Freq: Every day | ORAL | 3 refills | Status: DC | PRN

## 2016-06-30 NOTE — Patient Instructions (Addendum)
Medication Instructions:  DECREASE Aspirin to 81 mg once daily   Labwork: None Ordered   Testing/Procedures: Your physician has recommended that you have a sleep study. This test records several body functions during sleep, including: brain activity, eye movement, oxygen and carbon dioxide blood levels, heart rate and rhythm, breathing rate and rhythm, the flow of air through your mouth and nose, snoring, body muscle movements, and chest and belly movement. **Our office will contact you to schedule  Follow-Up: Your physician wants you to follow-up in: 6 months with Dr. Elease HashimotoNahser.  You will receive a reminder letter in the mail two months in advance. If you don't receive a letter, please call our office to schedule the follow-up appointment.   If you need a refill on your cardiac medications before your next appointment, please call your pharmacy.   Thank you for choosing [facility]! Eligha BridegroomMichelle Ulla Mckiernan, RN [phone]

## 2016-06-30 NOTE — Progress Notes (Signed)
Cardiology Office Note   Date:  06/30/2016   ID:  Nicholas Hughes, DOB 06-09-1970, MRN [id number]  PCP:  Patient, No Pcp Per  Cardiologist:   Kristeen MissPhilip Elara Cocke, MD   Chief Complaint  Patient presents with  . Hypertension   Problem list: 1. Hypertension 2. Left ventricular Hypertrophy 3. Transient atrial fibrillation   History of Present Illness: Nicholas BridegroomDellwood Fodor is a 46 y.o. male who presents for follow up of his HTN. He has not been having any CP or dyspnea. Eating better , does eat chicken wings every other Wednesday.   We performed an echo card gram back in 2011 which showed normal left ventricular systolic function. Mild mitral regurgitation.  No palpitations. Works for [employer] ( now has changed name to [employer] )   July 27, 2014:   Doing well . Reduced sex drive  Sept. 14, [id number]: Doing well BP is a bit high. He's been working out on a regular basis. 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   Has been overeating recently .    Has lost 10 labs since last year.  Has cut out his salt.   Eating lots of fruits.   Drinking protein shakes ( as a snack, not as a meal )  Is still trying to get his CPAP machine   March 27, 2016:  Doing well  BP has been well controlled  Eating better. Exercising well.    June 30, 2016:  Doing well Work is going well ,  BP has been well controlled Does not have a PCP    Past Medical History:  Diagnosis Date  . ED (erectile dysfunction)   . GERD (gastroesophageal reflux disease)   . Hypertension   . LVH (left ventricular hypertrophy)   . OSA (obstructive sleep apnea) 07/14/2014   Moderate OSA with AHI 20/hr  . Renal insufficiency   . Transient atrial fibrillation or flutter     Past Surgical History:  Procedure Laterality Date  . TRANSTHORACIC ECHOCARDIOGRAM  05/07/2009   EF 60-65%     Current Outpatient Prescriptions    Medication Sig Dispense Refill  . carvedilol (COREG) 3.125 MG tablet Take 1 tablet (3.125 mg total) by mouth 2 (two) times daily with a meal. 60 tablet 11  . diltiazem (CARTIA XT) 120 MG 24 hr capsule Take 1 capsule (120 mg total) by mouth daily. Please keep 06/30/16 appt for further refills 30 capsule 0  . lisinopril (PRINIVIL,ZESTRIL) 40 MG tablet Take 1 tablet (40 mg total) by mouth daily. Please keep 06/30/16 appt for further refills 30 tablet 0  . omeprazole (PRILOSEC OTC) 20 MG tablet Take 20 mg by mouth daily as needed (for acid reflux).     . triamterene-hydrochlorothiazide (MAXZIDE) 75-50 MG tablet Take 1 tablet by mouth daily. Please keep 06/30/16 appt for further refills. 30 tablet 0   No current facility-administered medications for this visit.     Allergies:   Patient has no known allergies.    Social History:  The patient  reports that he has never smoked. He has never used smokeless tobacco. He reports that he drinks alcohol. He reports that he does not use drugs.   Family History:  The patient's family history includes Coronary artery disease in his father; Hypertension in his father.    ROS:  Please see the history of present illness.    Review  of Systems: Constitutional:  denies fever, chills, diaphoresis, appetite change and fatigue.  HEENT: denies photophobia, eye pain, redness, hearing loss, ear pain, congestion, sore throat, rhinorrhea, sneezing, neck pain, neck stiffness and tinnitus.  Respiratory: denies SOB, DOE, cough, chest tightness, and wheezing.  Cardiovascular: denies chest pain, palpitations and leg swelling.  Gastrointestinal: denies nausea, vomiting, abdominal pain, diarrhea, constipation, blood in stool.  Genitourinary: denies dysuria, urgency, frequency, hematuria, flank pain and difficulty urinating.  Musculoskeletal: denies  myalgias, back pain, joint swelling, arthralgias and gait problem.   Skin: denies pallor, rash and wound.  Neurological: denies  dizziness, seizures, syncope, weakness, light-headedness, numbness and headaches.   Hematological: denies adenopathy, easy bruising, personal or family bleeding history.  Psychiatric/ Behavioral: denies suicidal ideation, mood changes, confusion, nervousness, sleep disturbance and agitation.       All other systems are reviewed and negative.    PHYSICAL EXAM: VS:  BP 136/82   Pulse 67   Ht 6' (1.829 m)   Wt 264 lb 4 oz (119.9 kg)   SpO2 96%   BMI 35.84 kg/m  , BMI Body mass index is 35.84 kg/m. GEN: Well nourished, well developed, in no acute distress  HEENT: normal  Neck: no JVD, carotid bruits, or masses Cardiac: RRR; no murmurs, rubs, or gallops,no edema  Respiratory:  clear to auscultation bilaterally, normal work of breathing GI: soft, nontender, nondistended, + BS MS: no deformity or atrophy  Skin: warm and dry, no rash Neuro:  Strength and sensation are intact Psych: normal   EKG:  EKG is ordered today. The ekg ordered today demonstrates NSR at 67.  LVH with repol abn.    Recent Labs: 03/27/2016: BUN 20; Creatinine, Ser 1.36; Potassium 3.9; Sodium 137    Lipid Panel    Component Value Date/Time   CHOL 158 03/07/2014 0923   TRIG 94.0 03/07/2014 0923   HDL 45.80 03/07/2014 0923   CHOLHDL 3 03/07/2014 0923   VLDL 18.8 03/07/2014 0923   LDLCALC 93 03/07/2014 0923      Wt Readings from Last 3 Encounters:  06/30/16 264 lb 4 oz (119.9 kg)  03/27/16 261 lb (118.4 kg)  11/06/15 256 lb 12.8 oz (116.5 kg)      Other studies Reviewed: Additional studies/ records that were reviewed today include: . Review of the above records demonstrates:    ASSESSMENT AND PLAN:  1. Hypertension-  Nicholas Hughes  Is doing well.  BP is well controlled.  Continue current meds.   2. Left ventricular Hypertrophy - stable   3. Transient atrial fibrillation- maintaining NSR.  Continue dilt    4.   Suspected sleep apnea:  Has OSA.  Did not get his CPAP due to insurance issues.    Now has insurance and wants to be re-evaluated.   5. ED - continue Sildinifil 20 mg tabs,   Current medicines are reviewed at length with the patient today.  The patient does not have concerns regarding medicines.  The following changes have been made:  Increase Maxzide   Disposition:   FU with me in 6 months  For OV     Kristeen Miss, MD  06/30/2016 8:42 AM    [facility] [address] [place], [place], [place]  [address] Phone: [phone]; Fax: [phone]

## 2016-07-01 ENCOUNTER — Other Ambulatory Visit: Payer: Self-pay | Admitting: Cardiovascular Disease

## 2016-07-01 MED ORDER — OMEPRAZOLE MAGNESIUM 20 MG PO TBEC: 20.0000 mg | DELAYED_RELEASE_TABLET | Freq: Every day | ORAL | 3 refills | Status: DC

## 2016-07-02 ENCOUNTER — Telehealth: Payer: Self-pay | Admitting: *Deleted

## 2016-07-02 DIAGNOSIS — G4733 Obstructive sleep apnea (adult) (pediatric): Secondary | ICD-10-CM

## 2016-07-02 NOTE — Telephone Encounter (Signed)
-----   Message from Levi AlandMichelle M Swinyer, RN sent at 06/30/2016  9:31 AM EDT ----- Herbert MoorsHey Nina Poo,  Dr. Harvie BridgeNahser's patient above needs a repeat sleep study and follow-up with Dr. Mayford Knifeurner.  He had a sleep study 2 years ago but was not able to get the cpap at that time.  Thank you! Marcelino DusterMichelle

## 2016-09-03 ENCOUNTER — Encounter: Payer: Self-pay | Admitting: *Deleted

## 2016-09-03 DIAGNOSIS — G4733 Obstructive sleep apnea (adult) (pediatric): Secondary | ICD-10-CM

## 2016-09-03 NOTE — Telephone Encounter (Signed)
This encounter was created in error - please disregard.

## 2016-09-03 NOTE — Telephone Encounter (Signed)
FW: Denied In Lab Split Night for 09-10-16  [facility]  Message Contents  Marilynne HalstedHall, Charmaine Ena DawleyM  Ravonda Brecheen G, CMA  Cc: Garnett FarmPennix, Terri; Boone, [place]ia        Nina   Per Dr. Mayford Knifeurner change split night to titration. Will need order for titration.   Auth for the Titration is pending. We can keep same date for now but change type of study.   Thanks   Charmaine   Previous Messages    ----- Message -----  From: Britt BologneseHall, Charmaine M  Sent: 09/02/2016  To: Britt Bologneseharmaine M Hall  Subject: FW: Denied In Lab Split Night for 09-10-16       ----- Message -----  From: Quintella Reicherturner, Traci R, MD  Sent: 09/01/2016 12:50 PM  To: Britt Bologneseharmaine M Hall  Subject: RE: Denied In Lab Split Night for 09-10-16     yes  ----- Message -----  From: Britt BologneseHall, Charmaine M  Sent: 09/01/2016 12:30 PM  To: Quintella Reichertraci R Turner, MD, Charmaine Judeth CornfieldM Hall  Subject: Denied In Lab Split Night for 09-10-16       Dr. Mayford Knifeurner   Pt had 2016 sleep study which id'd OSA. Pt didn't get CPAP d/t insurance. Insurance states doesn't need new study. They suggest a titration.   Ok to change to titration?   Thanks   Charmaine

## 2016-09-03 NOTE — Telephone Encounter (Signed)
-----   Message from [facility] End[facility] LtdCharmaine M Hall sent at 09/02/2016 12:28 PM EDT ----- Regarding: FW: Denied In Lab Split Night for 09-10-16 Coralee NorthNina  Per Dr. Mayford Knifeurner change split night to titration.  Will need order for titration.  Auth for the Titration is pending.  We can keep same date for now but change type of study.  Thanks  Charmaine ----- Message ----- From: Britt BologneseHall, Charmaine M Sent: 09/02/2016 To: Britt Bologneseharmaine M Hall Subject: FW: Denied In Lab Split Night for 09-10-16        ----- Message ----- From: Quintella Reicherturner, Traci R, MD Sent: 09/01/2016  12:50 PM To: Britt Bologneseharmaine M Hall Subject: RE: Denied In Lab Split Night for 09-10-16      yes ----- Message ----- From: Britt BologneseHall, Charmaine M Sent: 09/01/2016  12:30 PM To: Quintella Reichertraci R Turner, MD, Charmaine Judeth CornfieldM Hall Subject: Denied In Lab Split Night for 09-10-16          Dr. Mayford Knifeurner  Pt had 2016 sleep study which id'd OSA.  Pt didn't get CPAP d/t insurance.  Insurance states doesn't need new study.  They suggest a titration.  Ok to change to titration?  Thanks  Charmaine

## 2016-09-04 ENCOUNTER — Encounter: Payer: Self-pay | Admitting: *Deleted

## 2016-09-04 NOTE — Telephone Encounter (Addendum)
Titration has been ordered in place of the split night study.  Patient has been notified by mail and phone [phone].  New date for Titration is 11/02/16.

## 2016-09-05 ENCOUNTER — Other Ambulatory Visit: Payer: Self-pay | Admitting: Cardiovascular Disease

## 2016-09-08 ENCOUNTER — Encounter (HOSPITAL_BASED_OUTPATIENT_CLINIC_OR_DEPARTMENT_OTHER): Payer: Managed Care, Other (non HMO)

## 2016-10-05 NOTE — Telephone Encounter (Signed)
LMTCB

## 2016-10-09 NOTE — Telephone Encounter (Signed)
LMTCB

## 2016-10-22 NOTE — Telephone Encounter (Signed)
Spoke to the sleep lab today Marchelle Folks) and was informed the patient has cancelled his titration study because of financial reasons.

## 2016-10-22 NOTE — Telephone Encounter (Signed)
[address], Charmaine Ena Dawley, CMA  Cc: Bailei Buist Ogle; Pennix, Ruben Im   Please cancel in lab study and call pt. Pt is authorized for an HST TITRATION only. The authorization doesn't start until 09-18-16.   IN LAB DENIED. CANX IN LAB.  PT IS AUTHORIZED FOR HST TITRATION ONLY.   HST TITRATION PER PATRICIA NEMSON, [phone], FAX [phone], [id number] CPT 95806, VALID 8-24 TO 12-16-16   Thanks   Charmaine

## 2016-11-02 ENCOUNTER — Encounter (HOSPITAL_BASED_OUTPATIENT_CLINIC_OR_DEPARTMENT_OTHER): Payer: Managed Care, Other (non HMO)

## 2017-02-06 ENCOUNTER — Emergency Department (HOSPITAL_BASED_OUTPATIENT_CLINIC_OR_DEPARTMENT_OTHER)
Admission: EM | Admit: 2017-02-06 | Discharge: 2017-02-07 | Disposition: A | Discharge status: Home / Self Care | Payer: Managed Care, Other (non HMO) | Attending: Emergency Medicine | Admitting: Emergency Medicine

## 2017-02-06 DIAGNOSIS — Z79899 Other long term (current) drug therapy: Secondary | ICD-10-CM | POA: Insufficient documentation

## 2017-02-06 DIAGNOSIS — I10 Essential (primary) hypertension: Secondary | ICD-10-CM | POA: Insufficient documentation

## 2017-02-06 DIAGNOSIS — B9789 Other viral agents as the cause of diseases classified elsewhere: Secondary | ICD-10-CM | POA: Diagnosis not present

## 2017-02-06 DIAGNOSIS — Z7982 Long term (current) use of aspirin: Secondary | ICD-10-CM | POA: Diagnosis not present

## 2017-02-06 DIAGNOSIS — J069 Acute upper respiratory infection, unspecified: Secondary | ICD-10-CM | POA: Diagnosis not present

## 2017-02-06 DIAGNOSIS — R05 Cough: Secondary | ICD-10-CM | POA: Diagnosis present

## 2017-02-07 ENCOUNTER — Other Ambulatory Visit: Payer: Self-pay

## 2017-02-07 ENCOUNTER — Encounter (HOSPITAL_BASED_OUTPATIENT_CLINIC_OR_DEPARTMENT_OTHER): Payer: Self-pay | Admitting: *Deleted

## 2017-02-07 MED ORDER — BENZONATATE 100 MG PO CAPS: 100.0000 mg | ORAL_CAPSULE | Freq: Three times a day (TID) | ORAL | 0 refills | Status: DC

## 2017-02-07 MED ORDER — PROMETHAZINE-DM 6.25-15 MG/5ML PO SYRP: 5.0000 mL | ORAL_SOLUTION | Freq: Four times a day (QID) | ORAL | 0 refills | Status: DC | PRN

## 2017-02-07 NOTE — ED Triage Notes (Signed)
Pt reports productive cough x 4-5 days. Denies known fever

## 2017-02-07 NOTE — ED Provider Notes (Signed)
[facility] EMERGENCY DEPARTMENT Provider Note   CSN: [id number] Arrival date & time: 02/06/17  2353     History   Chief Complaint Chief Complaint  Patient presents with  . Cough    HPI Nicholas Hughes is a 47 y.o. male with a history of GERD, OSA and HTN who presents to the emergency department with a chief complaint of intermittently productive cough with yellow sputum that began 4-5 days ago with associated nasal congestion.  He denies fever, chills, dyspnea, chest or back pain, N/V/D, abdominal pain, sore throat, or otalgia.  He is treated his symptoms with Mucinex without improvement.  No aggravating or alleviating factors.  He currently intermittently takes Prilosec for GERD.  He has been on lisinopril for years without symptoms. No h/o of asthma. He is a non-smoker.   He is not up-to-date on his flu vaccination.  No known sick contacts.  The history is provided by the patient. No language interpreter was used.  Cough  This is a new problem. The current episode started more than 2 days ago. The problem occurs every few minutes. The problem has not changed since onset.The cough is productive of sputum (yellow). There has been no fever. Pertinent negatives include no chest pain, no chills, no ear pain, no rhinorrhea, no sore throat and no shortness of breath. Treatments tried: Mucinex. The treatment provided no relief. His past medical history does not include bronchitis, pneumonia, COPD or asthma.    Past Medical History:  Diagnosis Date  . ED (erectile dysfunction)   . GERD (gastroesophageal reflux disease)   . Hypertension   . LVH (left ventricular hypertrophy)   . OSA (obstructive sleep apnea) 07/14/2014   Moderate OSA with AHI 20/hr  . Renal insufficiency   . Transient atrial fibrillation or flutter     Patient Active Problem List   Diagnosis Date Noted  . OSA (obstructive sleep apnea) 07/14/2014  . Hypertension 09/10/2010  . Intermittent atrial  fibrillation (HCC) 09/10/2010    Past Surgical History:  Procedure Laterality Date  . TRANSTHORACIC ECHOCARDIOGRAM  05/07/2009   EF 60-65%       Home Medications    Prior to Admission medications   Medication Sig Start Date End Date Taking? Authorizing Provider  aspirin EC 81 MG tablet Take 1 tablet (81 mg total) by mouth daily. 06/30/16  Yes Nahser, Deloris Ping, MD  CARTIA XT 120 MG 24 hr capsule TAKE 1 CAPSULE BY MOUTH ONCE DAILY 09/07/16  Yes Nahser, Deloris Ping, MD  carvedilol (COREG) 3.125 MG tablet Take 1 tablet (3.125 mg total) by mouth 2 (two) times daily with a meal. 06/30/16  Yes Nahser, Deloris Ping, MD  lisinopril (PRINIVIL,ZESTRIL) 40 MG tablet Take 1 tablet (40 mg total) by mouth daily. 06/30/16  Yes Nahser, Deloris Ping, MD  omeprazole (PRILOSEC OTC) 20 MG tablet Take 1 tablet (20 mg total) by mouth daily. 07/01/16  Yes Nahser, Deloris Ping, MD  triamterene-hydrochlorothiazide (MAXZIDE) 75-50 MG tablet Take 1 tablet by mouth daily. 06/30/16  Yes Nahser, Deloris Ping, MD  benzonatate (TESSALON) 100 MG capsule Take 1 capsule (100 mg total) by mouth every 8 (eight) hours. 02/07/17   Faige Seely A, PA-C  diltiazem (CARTIA XT) 120 MG 24 hr capsule Take 1 capsule (120 mg total) by mouth daily. Please keep 06/30/16 appt for further refills 06/30/16   Nahser, Deloris Ping, MD  promethazine-dextromethorphan (PROMETHAZINE-DM) 6.25-15 MG/5ML syrup Take 5 mLs by mouth 4 (four) times daily as needed for cough. 02/07/17  Wasyl Dornfeld A, PA-C    Family History Family History  Problem Relation Age of Onset  . Hypertension Father   . Coronary artery disease Father     Social History Social History   Tobacco Use  . Smoking status: Never Smoker  . Smokeless tobacco: Never Used  Substance Use Topics  . Alcohol use: Yes    Comment: occasionally  . Drug use: No     Allergies   Patient has no known allergies.   Review of Systems Review of Systems  Constitutional: Negative for activity change, chills and fever.    HENT: Positive for congestion. Negative for ear pain, postnasal drip, rhinorrhea, sinus pressure, sinus pain and sore throat.   Eyes: Negative for visual disturbance.  Respiratory: Positive for cough. Negative for shortness of breath.   Cardiovascular: Negative for chest pain.  Gastrointestinal: Negative for abdominal pain, diarrhea, nausea and vomiting.  Musculoskeletal: Negative for back pain, neck pain and neck stiffness.  Skin: Negative for rash.     Physical Exam Updated Vital Signs Pulse 77   Temp 98.6 F (37 C) (Oral)   Resp 16   Ht 6' (1.829 m)   Wt 117.9 kg (260 lb)   SpO2 100%   BMI 35.26 kg/m   Physical Exam  Constitutional: He appears well-developed. No distress.  HENT:  Head: Normocephalic.  Right Ear: Tympanic membrane and ear canal normal.  Left Ear: Tympanic membrane and ear canal normal.  Nose: Rhinorrhea present. No mucosal edema. Right sinus exhibits no maxillary sinus tenderness and no frontal sinus tenderness. Left sinus exhibits no maxillary sinus tenderness and no frontal sinus tenderness.  Mouth/Throat: Uvula is midline and oropharynx is clear and moist. Mucous membranes are not pale, not dry and not cyanotic. No oropharyngeal exudate, posterior oropharyngeal edema, posterior oropharyngeal erythema or tonsillar abscesses. No tonsillar exudate.  Eyes: Conjunctivae are normal.  Neck: Normal range of motion. Neck supple.  Cardiovascular: Normal rate, regular rhythm, normal heart sounds and intact distal pulses. Exam reveals no gallop and no friction rub.  No murmur heard. Pulmonary/Chest: Effort normal and breath sounds normal. No stridor. No respiratory distress. He has no wheezes. He has no rales. He exhibits no tenderness.  Lungs are clear to auscultation bilaterally.  Abdominal: Soft. Bowel sounds are normal. He exhibits no distension and no mass. There is no tenderness. There is no rebound and no guarding. No hernia.  Lymphadenopathy:    He has no  cervical adenopathy.  Neurological: He is alert.  Skin: Skin is warm and dry. Capillary refill takes less than 2 seconds. He is not diaphoretic.  Psychiatric: His behavior is normal.  Nursing note and vitals reviewed.    ED Treatments / Results  Labs (all labs ordered are listed, but only abnormal results are displayed) Labs Reviewed - No data to display  EKG  EKG Interpretation None       Radiology No results found.  Procedures Procedures (including critical care time)  Medications Ordered in ED Medications - No data to display   Initial Impression / Assessment and Plan / ED Course  I have reviewed the triage vital signs and the nursing notes.  Pertinent labs & imaging results that were available during my care of the patient were reviewed by me and considered in my medical decision making (see chart for details).     47 year old male with a history of GERD, OSA, and HTN presenting with productive cough with yellow sputum and nasal congestion for 4-5 days.  He is hemodynamically stable.  Patients symptoms are consistent with URI, likely viral etiology. Doubt influenza, pneumonia, side effect of medication, or asthma. discussed that antibiotics are not indicated for viral infections. Pt will be discharged with symptomatic treatment.  Verbalizes understanding and is agreeable with plan.  Precautions given.  Pt is in NAD prior to dc.  Final Clinical Impressions(s) / ED Diagnoses   Final diagnoses:  Viral upper respiratory tract infection with cough    ED Discharge Orders        Ordered    benzonatate (TESSALON) 100 MG capsule  Every 8 hours     02/07/17 0025    promethazine-dextromethorphan (PROMETHAZINE-DM) 6.25-15 MG/5ML syrup  4 times daily PRN     02/07/17 0025       Belma Dyches A, PA-C 02/07/17 0032    Molpus, John, MD 02/07/17 [id number]

## 2017-02-07 NOTE — Discharge Instructions (Signed)
Take 5 mL of Promethazine DM cough syrup every 6 hours as needed for cough and nasal congestion. You can also try benzonatate, which can be taken once every 8 hours for cough.  Continue to try taking 1 teaspoon of honey as needed for your cough.  A cough can sometimes be the last symptom to resolve with a viral infection.  It may take up to 4-6 weeks.   If your cough does not resolve at that time it may be due to your acid reflux. If you do not want to take a medication daily for acid reflux, you can try taking ranitidine, which is available over-the-counter.  Ranitidine can be taken as needed.  Prilosec takes several days to start working so if you only take it after you develop symptoms, it may not help to improve your acid reflux.  If you develop new or worsening symptoms, including fever that does not improve by taking Tylenol, severe shortness of breath, difficulty breathing, or shortness of breath and chest pain, please return to the emergency department for re-evaluation.

## 2017-06-05 ENCOUNTER — Encounter (HOSPITAL_COMMUNITY): Payer: Self-pay | Admitting: Emergency Medicine

## 2017-06-05 ENCOUNTER — Emergency Department (HOSPITAL_COMMUNITY)
Admission: EM | Admit: 2017-06-05 | Discharge: 2017-06-05 | Disposition: A | Discharge status: Home / Self Care | Payer: Managed Care, Other (non HMO) | Attending: Emergency Medicine | Admitting: Emergency Medicine

## 2017-06-05 ENCOUNTER — Other Ambulatory Visit: Payer: Self-pay

## 2017-06-05 DIAGNOSIS — I10 Essential (primary) hypertension: Secondary | ICD-10-CM | POA: Insufficient documentation

## 2017-06-05 DIAGNOSIS — H9311 Tinnitus, right ear: Secondary | ICD-10-CM | POA: Diagnosis present

## 2017-06-05 DIAGNOSIS — Z79899 Other long term (current) drug therapy: Secondary | ICD-10-CM | POA: Diagnosis not present

## 2017-06-05 NOTE — ED Provider Notes (Signed)
[facility]-EMERGENCY DEPT Provider Note   CSN: [id number] Arrival date & time: 06/05/17  0214     History   Chief Complaint Chief Complaint  Patient presents with  . Tinnitus    HPI Nicholas Hughes is a 47 y.o. male with past medical history of hypertension, who presents to ED for evaluation of 4-hour history of ringing in his right ear.  States that symptoms began suddenly.  He states that since arrival to the ED, the symptoms have subsided without intervention.  Now he just wants to "make sure I do not have an infection or something."  Cannot recall any inciting event that may have triggered the symptoms.  Denies any ear pain, fevers, drainage from the ear, congestion, trauma to the area, chronic salicylate use, neck pain, family or personal history of aneurysms. Patient states that he is compliant with his hypertension medication.  HPI  Past Medical History:  Diagnosis Date  . ED (erectile dysfunction)   . GERD (gastroesophageal reflux disease)   . Hypertension   . LVH (left ventricular hypertrophy)   . OSA (obstructive sleep apnea) 07/14/2014   Moderate OSA with AHI 20/hr  . Renal insufficiency   . Transient atrial fibrillation or flutter     Patient Active Problem List   Diagnosis Date Noted  . OSA (obstructive sleep apnea) 07/14/2014  . Hypertension 09/10/2010  . Intermittent atrial fibrillation (HCC) 09/10/2010    Past Surgical History:  Procedure Laterality Date  . TRANSTHORACIC ECHOCARDIOGRAM  05/07/2009   EF 60-65%        Home Medications    Prior to Admission medications   Medication Sig Start Date End Date Taking? Authorizing Provider  aspirin EC 81 MG tablet Take 1 tablet (81 mg total) by mouth daily. 06/30/16   Nahser, Deloris Ping, MD  benzonatate (TESSALON) 100 MG capsule Take 1 capsule (100 mg total) by mouth every 8 (eight) hours. 02/07/17   McDonald, Mia A, PA-C  CARTIA XT 120 MG 24 hr capsule TAKE 1 CAPSULE BY MOUTH ONCE DAILY  09/07/16   Nahser, Deloris Ping, MD  carvedilol (COREG) 3.125 MG tablet Take 1 tablet (3.125 mg total) by mouth 2 (two) times daily with a meal. 06/30/16   Nahser, Deloris Ping, MD  diltiazem (CARTIA XT) 120 MG 24 hr capsule Take 1 capsule (120 mg total) by mouth daily. Please keep 06/30/16 appt for further refills 06/30/16   Nahser, Deloris Ping, MD  lisinopril (PRINIVIL,ZESTRIL) 40 MG tablet Take 1 tablet (40 mg total) by mouth daily. 06/30/16   Nahser, Deloris Ping, MD  omeprazole (PRILOSEC OTC) 20 MG tablet Take 1 tablet (20 mg total) by mouth daily. 07/01/16   Nahser, Deloris Ping, MD  promethazine-dextromethorphan (PROMETHAZINE-DM) 6.25-15 MG/5ML syrup Take 5 mLs by mouth 4 (four) times daily as needed for cough. 02/07/17   McDonald, Mia A, PA-C  triamterene-hydrochlorothiazide (MAXZIDE) 75-50 MG tablet Take 1 tablet by mouth daily. 06/30/16   Nahser, Deloris Ping, MD    Family History Family History  Problem Relation Age of Onset  . Hypertension Father   . Coronary artery disease Father     Social History Social History   Tobacco Use  . Smoking status: Never Smoker  . Smokeless tobacco: Never Used  Substance Use Topics  . Alcohol use: Yes    Comment: occasionally  . Drug use: No     Allergies   Patient has no known allergies.   Review of Systems Review of Systems  Constitutional: Negative  for fever.  HENT: Positive for tinnitus. Negative for ear discharge, ear pain and hearing loss.   Eyes: Negative for visual disturbance.  Respiratory: Negative for shortness of breath.   Cardiovascular: Negative for chest pain.  Musculoskeletal: Negative for neck pain.  Skin: Negative for color change.  Neurological: Negative for headaches.     Physical Exam Updated Vital Signs BP (!) 167/118 (BP Location: Left Arm)   Pulse 77   Temp 98.5 F (36.9 C) (Oral)   Resp 18   Ht 6' (1.829 m)   Wt 119.7 kg (264 lb)   SpO2 99%   BMI 35.80 kg/m   Physical Exam  Constitutional: He appears well-developed and  well-nourished. No distress.  HENT:  Head: Normocephalic and atraumatic.  Right Ear: No mastoid tenderness. Tympanic membrane is not scarred, not perforated, not erythematous, not retracted and not bulging. A middle ear effusion is present. No hemotympanum.  Left Ear: No mastoid tenderness. Tympanic membrane is not scarred, not perforated, not erythematous, not retracted and not bulging. A middle ear effusion is present. No hemotympanum.  Eyes: Conjunctivae and EOM are normal. No scleral icterus.  Neck: Normal range of motion.  Pulmonary/Chest: Effort normal. No respiratory distress.  Neurological: He is alert.  Skin: No rash noted. He is not diaphoretic.  Psychiatric: He has a normal mood and affect.  Nursing note and vitals reviewed.    ED Treatments / Results  Labs (all labs ordered are listed, but only abnormal results are displayed) Labs Reviewed - No data to display  EKG None  Radiology No results found.  Procedures Procedures (including critical care time)  Medications Ordered in ED Medications - No data to display   Initial Impression / Assessment and Plan / ED Course  I have reviewed the triage vital signs and the nursing notes.  Pertinent labs & imaging results that were available during my care of the patient were reviewed by me and considered in my medical decision making (see chart for details).     Patient presents to ED for evaluation of tinnitus in right ear for the past several hours.  His symptoms have resolved since arrival in the ED.  This is without intervention.  Patient also would like to make sure that he does not have an ear infection.  There is no evidence of infection on my examination.  Patient denies any chronic salicylate use, neck pain, personal or family history of aneurysms.  Advised to follow-up with PCP for further evaluation and symptoms persist.  Advised to return to ED for any severe worsening symptoms. Patient states that he has taken  his hypertension medications this morning. Patient's blood pressure initially elevated in the emergency department today. Patient denies headache, change in vision, numbness, weakness, chest pain, dyspnea, dizziness, or lightheadedness therefore doubt hypertensive emergency. Discussed elevated blood pressure with the patient and the need for primary care follow up with potential need to initiate or change antihypertensive medications and or for further evaluation. Discussed return precaution signs/symptoms for hypertensive emergency as listed above with the patient.   Portions of this note were generated with Scientist, clinical (histocompatibility and immunogenetics). Dictation errors may occur despite best attempts at proofreading.   Final Clinical Impressions(s) / ED Diagnoses   Final diagnoses:  Tinnitus of right ear    ED Discharge Orders    None       Dietrich Pates, PA-C 06/05/17 0631    Derwood Kaplan, MD 06/05/17 2336

## 2017-06-05 NOTE — ED Triage Notes (Signed)
Patient complaining of ringing in right ear. Patient states it started a few hours ago.

## 2017-07-08 ENCOUNTER — Other Ambulatory Visit: Payer: Self-pay | Admitting: Cardiovascular Disease

## 2017-07-16 ENCOUNTER — Other Ambulatory Visit: Payer: Self-pay | Admitting: Cardiovascular Disease

## 2017-08-12 ENCOUNTER — Other Ambulatory Visit: Payer: Self-pay | Admitting: Cardiovascular Disease

## 2017-08-12 MED ORDER — DILTIAZEM HCL ER COATED BEADS 120 MG PO CP24: 120.0000 mg | ORAL_CAPSULE | Freq: Every day | ORAL | 2 refills | Status: DC

## 2017-08-12 MED ORDER — TRIAMTERENE-HCTZ 75-50 MG PO TABS
1.0000 | ORAL_TABLET | Freq: Every day | ORAL | 2 refills | Status: DC
Start: 2017-08-12 — End: 2018-03-29

## 2017-08-12 NOTE — Telephone Encounter (Signed)
New Message        *STAT* If patient is at the pharmacy, call can be transferred to refill team.   1. Which medications need to be refilled? (please list name of each medication and dose if known) Lisinopril 40 mg/triamterene-hydrochlorothiazide (MAXZIDE) 75-50 MG tablet/CARTIA XT 120 MG 24 hr capsule  2. Which pharmacy/location (including street and city if local pharmacy) is medication to be sent to? [facility]  3. Do they need a 30 day or 90 day supply? 30

## 2017-08-12 NOTE — Telephone Encounter (Signed)
Pt's medications were sent to pt's pharmacy as requested. Confirmation received.  

## 2017-08-27 ENCOUNTER — Other Ambulatory Visit: Payer: Self-pay | Admitting: Cardiovascular Disease

## 2017-11-01 ENCOUNTER — Encounter: Payer: Self-pay | Admitting: Cardiovascular Disease

## 2017-11-01 ENCOUNTER — Ambulatory Visit (INDEPENDENT_AMBULATORY_CARE_PROVIDER_SITE_OTHER): Payer: Managed Care, Other (non HMO) | Admitting: Cardiovascular Disease

## 2017-11-01 VITALS — BP 150/98 | HR 67 | Ht 72.0 in | Wt 265.0 lb

## 2017-11-01 DIAGNOSIS — I480 Paroxysmal atrial fibrillation: Secondary | ICD-10-CM

## 2017-11-01 DIAGNOSIS — I10 Essential (primary) hypertension: Secondary | ICD-10-CM | POA: Diagnosis not present

## 2017-11-01 MED ORDER — DILTIAZEM HCL ER COATED BEADS 120 MG PO CP24: 120.0000 mg | ORAL_CAPSULE | Freq: Every day | ORAL | 3 refills | Status: DC

## 2017-11-01 MED ORDER — CARVEDILOL 3.125 MG PO TABS: 3.1250 mg | ORAL_TABLET | Freq: Two times a day (BID) | ORAL | 3 refills | Status: DC

## 2017-11-01 MED ORDER — LISINOPRIL 40 MG PO TABS: 40.0000 mg | ORAL_TABLET | Freq: Every day | ORAL | 3 refills | Status: DC

## 2017-11-01 NOTE — Patient Instructions (Signed)
Medication Instructions:  Your physician recommends that you continue on your current medications as directed. Please refer to the Current Medication list given to you today.   If you need a refill on your cardiac medications before your next appointment, please call your pharmacy.   Lab work: None Ordered  If you have labs (blood work) drawn today and your tests are completely normal, you will receive your results only by: [name] MyChart Message (if you have MyChart) OR . A paper copy in the mail If you have any lab test that is abnormal or we need to change your treatment, we will call you to review the results.  Testing/Procedures: None Ordered   Follow-Up: At [facility], you and your health needs are our priority.  As part of our continuing mission to provide you with exceptional heart care, we have created designated Provider Care Teams.  These Care Teams include your primary Cardiologist (physician) and Advanced Practice Providers (APPs -  Physician Assistants and Nurse Practitioners) who all work together to provide you with the care you need, when you need it. You will need a follow up appointment in:  12 months.  Please call our office 2 months in advance to schedule this appointment.  You may see Dr. Elease Hashimoto or one of the following Advanced Practice Providers on your designated Care Team: Tereso Newcomer, PA-C [place], [place] . Berton Bon, NP

## 2017-11-01 NOTE — Progress Notes (Signed)
Cardiology Office Note   Date:  11/01/2017   ID:  Hridhaan Yohn, DOB 1970/12/01, MRN [id number]  PCP:  Patient, No Pcp Per  Cardiologist:   Kristeen Miss, MD   Chief Complaint  Patient presents with  . Hypertension   Problem list: 1. Hypertension 2. Left ventricular Hypertrophy 3. Transient atrial fibrillation    Miciah Funari is a 47 y.o. male who presents for follow up of his HTN. He has not been having any CP or dyspnea. Eating better , does eat chicken wings every other Wednesday.   We performed an echo card gram back in 2011 which showed normal left ventricular systolic function. Mild mitral regurgitation.  No palpitations. Works for [employer] ( now has changed name to [employer] )   July 27, 2014:   Doing well . Reduced sex drive  Sept. 14, 4098: Doing well BP is a bit high. He's been working out on a regular basis. He still eats some extra salt ([place] bacon)   April 03, 2015: Still working at [employer] and also works in the [employer].   Has been overeating recently .    Has lost 10 labs since last year.  Has cut out his salt.   Eating lots of fruits.   Drinking protein shakes ( as a snack, not as a meal )  Is still trying to get his CPAP machine   March 27, 2016:  Doing well  BP has been well controlled  Eating better. Exercising well.    June 30, 2016:  Doing well Work is going well ,  BP has been well controlled Does not have a PCP   November 01, 2017: Doing well. Has not been to sleep yet today - working 2 jobs - his main job is moving to [place].  BP is a bit elevated today  Not working out as much . BP has been ok    Past Medical History:  Diagnosis Date  . ED (erectile dysfunction)   . GERD (gastroesophageal reflux disease)   . Hypertension   . LVH (left ventricular hypertrophy)   . OSA (obstructive sleep apnea) 07/14/2014   Moderate OSA with AHI 20/hr  . Renal insufficiency   . Transient atrial fibrillation or  flutter     Past Surgical History:  Procedure Laterality Date  . TRANSTHORACIC ECHOCARDIOGRAM  05/07/2009   EF 60-65%     Current Outpatient Medications  Medication Sig Dispense Refill  . aspirin EC 81 MG tablet Take 1 tablet (81 mg total) by mouth daily.    . carvedilol (COREG) 3.125 MG tablet Take 1 tablet (3.125 mg total) by mouth 2 (two) times daily with a meal. 180 tablet 3  . diltiazem (CARTIA XT) 120 MG 24 hr capsule Take 1 capsule (120 mg total) by mouth daily. Please keep upcoming appt in October for future refills. Thank you 30 capsule 2  . lisinopril (PRINIVIL,ZESTRIL) 40 MG tablet Take 1 tablet (40 mg total) by mouth daily. Please keep upcoming appointment for more refills. 30 tablet 1  . promethazine-dextromethorphan (PROMETHAZINE-DM) 6.25-15 MG/5ML syrup Take 5 mLs by mouth 4 (four) times daily as needed for cough. 180 mL 0  . triamterene-hydrochlorothiazide (MAXZIDE) 75-50 MG tablet Take 1 tablet by mouth daily. Please keep upcoming appt in October for future refills. Thank you 30 tablet 2   No current facility-administered medications for this visit.     Allergies:   Patient has no known allergies.    Social  History:  The patient  reports that he has never smoked. He has never used smokeless tobacco. He reports that he drinks alcohol. He reports that he does not use drugs.   Family History:  The patient's family history includes Coronary artery disease in his father; Hypertension in his father.    ROS:  Please see the history of present illness.     Physical Exam: Blood pressure (!) 150/98, pulse 67, height 6' (1.829 m), weight 265 lb (120.2 kg), SpO2 98 %.  GEN:  Well nourished, well developed in no acute distress HEENT: Normal NECK: No JVD; No carotid bruits LYMPHATICS: No lymphadenopathy CARDIAC: RRR RESPIRATORY:  Clear to auscultation without rales, wheezing or rhonchi  ABDOMEN: Soft, non-tender, non-distended MUSCULOSKELETAL:  No edema; No deformity    SKIN: Warm and dry NEUROLOGIC:  Alert and oriented x 3   EKG:   November 01, 2017: Normal sinus rhythm at 67.  Voltage criteria for left ventricular hypertrophy.  Wave inversions in the inferior and anterolateral leads.   Recent Labs: No results found for requested labs within last 8760 hours.    Lipid Panel    Component Value Date/Time   CHOL 158 03/07/2014 0923   TRIG 94.0 03/07/2014 0923   HDL 45.80 03/07/2014 0923   CHOLHDL 3 03/07/2014 0923   VLDL 18.8 03/07/2014 0923   LDLCALC 93 03/07/2014 0923      Wt Readings from Last 3 Encounters:  11/01/17 265 lb (120.2 kg)  06/05/17 264 lb (119.7 kg)  02/07/17 260 lb (117.9 kg)      Other studies Reviewed: Additional studies/ records that were reviewed today include: . Review of the above records demonstrates:    ASSESSMENT AND PLAN:  1. Hypertension-    BP is a bit high - has not been to sleep today.  Continue meds Needs to work on diet , exercise , weight loss   2. Left ventricular Hypertrophy -   Stable   3. Transient atrial fibrillation-   Maintaining NSR , continue meds.   4.   Suspected sleep apnea:     5. ED -    Current medicines are reviewed at length with the patient today.  The patient does not have concerns regarding medicines.  The following changes have been made:  Increase Maxzide   Disposition:   FU with me in  1 year    Kristeen Miss, MD  11/01/2017 8:56 AM    [facility] [address] [place], [place], [place]  [address] Phone: [phone]; Fax: [phone]

## 2018-02-09 ENCOUNTER — Other Ambulatory Visit: Payer: Self-pay

## 2018-02-09 ENCOUNTER — Encounter (HOSPITAL_BASED_OUTPATIENT_CLINIC_OR_DEPARTMENT_OTHER): Payer: Self-pay

## 2018-02-09 ENCOUNTER — Emergency Department (HOSPITAL_BASED_OUTPATIENT_CLINIC_OR_DEPARTMENT_OTHER)
Admission: EM | Admit: 2018-02-09 | Discharge: 2018-02-09 | Disposition: A | Discharge status: Home / Self Care | Payer: Managed Care, Other (non HMO) | Attending: Emergency Medicine | Admitting: Emergency Medicine

## 2018-02-09 DIAGNOSIS — Z7982 Long term (current) use of aspirin: Secondary | ICD-10-CM | POA: Insufficient documentation

## 2018-02-09 DIAGNOSIS — M545 Low back pain: Secondary | ICD-10-CM | POA: Insufficient documentation

## 2018-02-09 DIAGNOSIS — Z79899 Other long term (current) drug therapy: Secondary | ICD-10-CM | POA: Insufficient documentation

## 2018-02-09 DIAGNOSIS — I10 Essential (primary) hypertension: Secondary | ICD-10-CM | POA: Insufficient documentation

## 2018-02-09 MED ORDER — METHOCARBAMOL 500 MG PO TABS: 500.0000 mg | ORAL_TABLET | Freq: Three times a day (TID) | ORAL | 0 refills | Status: DC | PRN
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
## 2018-02-28 ENCOUNTER — Other Ambulatory Visit: Payer: Self-pay

## 2018-02-28 ENCOUNTER — Encounter (HOSPITAL_COMMUNITY): Payer: Self-pay

## 2018-02-28 ENCOUNTER — Emergency Department (HOSPITAL_COMMUNITY)
Admission: EM | Admit: 2018-02-28 | Discharge: 2018-02-28 | Disposition: A | Discharge status: Home / Self Care | Payer: Self-pay | Attending: Emergency Medicine | Admitting: Emergency Medicine

## 2018-02-28 DIAGNOSIS — I10 Essential (primary) hypertension: Secondary | ICD-10-CM | POA: Insufficient documentation

## 2018-02-28 DIAGNOSIS — Z7982 Long term (current) use of aspirin: Secondary | ICD-10-CM | POA: Insufficient documentation

## 2018-02-28 DIAGNOSIS — Z79899 Other long term (current) drug therapy: Secondary | ICD-10-CM | POA: Insufficient documentation

## 2018-02-28 LAB — CBC WITH DIFFERENTIAL/PLATELET
Abs Immature Granulocytes: 0.01 10*3/uL (ref 0.00–0.07)
Basophils Absolute: 0 10*3/uL (ref 0.0–0.1)
Basophils Relative: 0 %
Eosinophils Absolute: 0 10*3/uL (ref 0.0–0.5)
Eosinophils Relative: 0 %
HCT: 42.7 % (ref 39.0–52.0)
Hemoglobin: 14.3 g/dL (ref 13.0–17.0)
Immature Granulocytes: 0 %
Lymphocytes Relative: 22 %
Lymphs Abs: 1.1 10*3/uL (ref 0.7–4.0)
MCH: 28.7 pg (ref 26.0–34.0)
MCHC: 33.5 g/dL (ref 30.0–36.0)
MCV: 85.6 fL (ref 80.0–100.0)
Monocytes Absolute: 0.4 10*3/uL (ref 0.1–1.0)
Monocytes Relative: 8 %
Neutro Abs: 3.3 10*3/uL (ref 1.7–7.7)
Neutrophils Relative %: 70 %
Platelets: 275 10*3/uL (ref 150–400)
RBC: 4.99 MIL/uL (ref 4.22–5.81)
RDW: 12.1 % (ref 11.5–15.5)
WBC: 4.8 10*3/uL (ref 4.0–10.5)
nRBC: 0 % (ref 0.0–0.2)

## 2018-02-28 LAB — BASIC METABOLIC PANEL
Anion gap: 8 (ref 5–15)
BUN: 12 mg/dL (ref 6–20)
CO2: 27 mmol/L (ref 22–32)
Calcium: 9 mg/dL (ref 8.9–10.3)
Chloride: 102 mmol/L (ref 98–111)
Creatinine, Ser: 1.25 mg/dL — ABNORMAL HIGH (ref 0.61–1.24)
GFR calc Af Amer: >60 mL/min (ref >60)
GFR calc non Af Amer: >60 mL/min (ref >60)
Glucose, Bld: 105 mg/dL — ABNORMAL HIGH (ref 70–99)
Potassium: 3.6 mmol/L (ref 3.5–5.1)
Sodium: 137 mmol/L (ref 135–145)

## 2018-02-28 LAB — URINALYSIS, ROUTINE W REFLEX MICROSCOPIC
Bilirubin Urine: NEGATIVE
Glucose, UA: NEGATIVE mg/dL
Hgb urine dipstick: NEGATIVE
Ketones, ur: NEGATIVE mg/dL
Leukocytes, UA: NEGATIVE
Nitrite: NEGATIVE
Protein, ur: NEGATIVE mg/dL
Specific Gravity, Urine: 1.012 (ref 1.005–1.030)
pH: 7 (ref 5.0–8.0)

## 2018-02-28 MED ORDER — DILTIAZEM HCL ER COATED BEADS 120 MG PO CP24
120.0000 mg | ORAL_CAPSULE | Freq: Every day | ORAL | Status: DC
  Administered 2018-02-28: 120 mg via ORAL
  Filled 2018-02-28: qty 1

## 2018-02-28 MED ORDER — CARVEDILOL 3.125 MG PO TABS
3.1250 mg | ORAL_TABLET | Freq: Two times a day (BID) | ORAL | Status: DC
  Administered 2018-02-28: 3.125 mg via ORAL
  Filled 2018-02-28: qty 1

## 2018-02-28 MED ORDER — LISINOPRIL 20 MG PO TABS
40.0000 mg | ORAL_TABLET | Freq: Every day | ORAL | Status: DC
  Administered 2018-02-28: 40 mg via ORAL
  Filled 2018-02-28: qty 2

## 2018-02-28 MED ORDER — CLONIDINE HCL 0.1 MG PO TABS
0.2000 mg | ORAL_TABLET | Freq: Once | ORAL | Status: AC
  Administered 2018-02-28: 0.2 mg via ORAL
  Filled 2018-02-28: qty 2

## 2018-02-28 NOTE — Discharge Instructions (Addendum)
Return here as needed. Follow up with your doctor as soon as possible about your blood pressure.

## 2018-02-28 NOTE — ED Triage Notes (Signed)
Pt reports dizziness and lightheadedness starting yesterday. He reports that he thinks it is his blood pressure. Reports compliance with BP meds. Denies pain.

## 2018-03-06 NOTE — ED Provider Notes (Signed)
[facility]-EMERGENCY DEPT Provider Note   CSN: [id number] Arrival date & time: 02/28/18  0400     History   Chief Complaint Chief Complaint  Patient presents with  . Hypertension    HPI Nicholas Hughes is a 48 y.o. male.  HPI Patient presents to the emergency department with elevated blood pressure along with some dizziness and lightheadedness that started yesterday.  Patient states that he has been using his blood pressure medicines without difficulty.  He states he did run out last week but then started them again yesterday.  Patient states that nothing seems make the condition better or worse.  She is feeling no symptoms at this time.  But is concerned about his blood pressure.  The patient denies chest pain, shortness of breath, headache,blurred vision, neck pain, fever, cough, weakness, numbness,  anorexia, edema, abdominal pain, nausea, vomiting, diarrhea, rash, back pain, dysuria, hematemesis, bloody stool, near syncope, or syncope. Past Medical History:  Diagnosis Date  . ED (erectile dysfunction)   . GERD (gastroesophageal reflux disease)   . Hypertension   . LVH (left ventricular hypertrophy)   . OSA (obstructive sleep apnea) 07/14/2014   Moderate OSA with AHI 20/hr  . Renal insufficiency   . Transient atrial fibrillation or flutter     Patient Active Problem List   Diagnosis Date Noted  . OSA (obstructive sleep apnea) 07/14/2014  . Hypertension 09/10/2010  . Intermittent atrial fibrillation (HCC) 09/10/2010    Past Surgical History:  Procedure Laterality Date  . TRANSTHORACIC ECHOCARDIOGRAM  05/07/2009   EF 60-65%        Home Medications    Prior to Admission medications   Medication Sig Start Date End Date Taking? Authorizing Provider  aspirin EC 81 MG tablet Take 1 tablet (81 mg total) by mouth daily. 06/30/16  Yes Nahser, Deloris PingPhilip J, MD  carvedilol (COREG) 3.125 MG tablet Take 1 tablet (3.125 mg total) by mouth 2 (two) times daily with  a meal. 11/01/17  Yes Nahser, Deloris PingPhilip J, MD  diltiazem (CARTIA XT) 120 MG 24 hr capsule Take 1 capsule (120 mg total) by mouth daily. 11/01/17  Yes Nahser, Deloris PingPhilip J, MD  lisinopril (PRINIVIL,ZESTRIL) 40 MG tablet Take 1 tablet (40 mg total) by mouth daily. 11/01/17  Yes Nahser, Deloris PingPhilip J, MD  methocarbamol (ROBAXIN) 500 MG tablet Take 1 tablet (500 mg total) by mouth every 8 (eight) hours as needed for muscle spasms. 02/09/18  Yes Petrucelli, Samantha R, PA-C  promethazine-dextromethorphan (PROMETHAZINE-DM) 6.25-15 MG/5ML syrup Take 5 mLs by mouth 4 (four) times daily as needed for cough. 02/07/17  Yes McDonald, Mia A, PA-C  triamterene-hydrochlorothiazide (MAXZIDE) 75-50 MG tablet Take 1 tablet by mouth daily. Please keep upcoming appt in October for future refills. Thank you Patient not taking: Reported on 02/28/2018 08/12/17   Nahser, Deloris PingPhilip J, MD    Family History Family History  Problem Relation Age of Onset  . Hypertension Father   . Coronary artery disease Father     Social History Social History   Tobacco Use  . Smoking status: Never Smoker  . Smokeless tobacco: Never Used  Substance Use Topics  . Alcohol use: Yes    Comment: occasionally  . Drug use: No     Allergies   Patient has no known allergies.   Review of Systems Review of Systems All other systems negative except as documented in the HPI. All pertinent positives and negatives as reviewed in the HPI.  Physical Exam Updated Vital Signs BP [name](!)  174/126   Pulse 77   Temp 98.2 F (36.8 C) (Oral)   Resp 17   SpO2 97%   Physical Exam Vitals signs and nursing note reviewed.  Constitutional:      General: He is not in acute distress.    Appearance: He is well-developed.  HENT:     Head: Normocephalic and atraumatic.  Eyes:     Pupils: Pupils are equal, round, and reactive to light.  Neck:     Musculoskeletal: Normal range of motion and neck supple.  Cardiovascular:     Rate and Rhythm: Normal rate and regular  rhythm.     Heart sounds: Normal heart sounds. No murmur. No friction rub. No gallop.   Pulmonary:     Effort: Pulmonary effort is normal. No respiratory distress.     Breath sounds: Normal breath sounds. No wheezing.  Abdominal:     General: Bowel sounds are normal. There is no distension.     Palpations: Abdomen is soft.     Tenderness: There is no abdominal tenderness.  Skin:    General: Skin is warm and dry.     Capillary Refill: Capillary refill takes less than 2 seconds.     Findings: No erythema or rash.  Neurological:     Mental Status: He is alert and oriented to person, place, and time.     Cranial Nerves: No cranial nerve deficit.     Sensory: No sensory deficit.     Motor: No weakness or abnormal muscle tone.     Coordination: Coordination normal.     Gait: Gait normal.     Deep Tendon Reflexes: Reflexes normal.  Psychiatric:        Behavior: Behavior normal.      ED Treatments / Results  Labs (all labs ordered are listed, but only abnormal results are displayed) Labs Reviewed  BASIC METABOLIC PANEL - Abnormal; Notable for the following components:      Result Value   Glucose, Bld 105 (*)    Creatinine, Ser 1.25 (*)    All other components within normal limits  CBC WITH DIFFERENTIAL/PLATELET  URINALYSIS, ROUTINE W REFLEX MICROSCOPIC    EKG EKG Interpretation  Date/Time:  Monday February 28 2018 04:58:42 EST Ventricular Rate:  73 PR Interval:    QRS Duration: 93 QT Interval:  364 QTC Calculation: 402 R Axis:   26 Text Interpretation:  Sinus rhythm Left atrial enlargement Posterior infarct, old Abnormal T, consider ischemia, diffuse leads Confirmed by Kennis Carina [phone]) on 03/01/2018 6:52:28 PM   Radiology No results found.  Procedures Procedures (including critical care time)  Medications Ordered in ED Medications  cloNIDine (CATAPRES) tablet 0.2 mg (0.2 mg Oral Given 02/28/18 1351)     Initial Impression / Assessment and Plan / ED Course  I  have reviewed the triage vital signs and the nursing notes.  Pertinent labs & imaging results that were available during my care of the patient were reviewed by me and considered in my medical decision making (see chart for details).     She was referred back to his primary doctor for further evaluation care of his blood pressure.  The patient does not have any hypertensive crisis or urgency at this time.  He has had no neurological deficits and does not have any endorgan damage that is noted on examination.  Patient is advised to return here as needed patient agrees the plan and all questions were answered.  Final Clinical Impressions(s) / ED Diagnoses  Final diagnoses:  Essential hypertension    ED Discharge Orders    None       Charlestine NightLawyer, Dyer Klug, Cordelia Poche-C 03/06/18 1644    Gerhard MunchLockwood, Robert, MD 03/06/18 [phone]

## 2018-03-28 ENCOUNTER — Other Ambulatory Visit: Payer: Self-pay | Admitting: Cardiovascular Disease

## 2018-11-03 ENCOUNTER — Other Ambulatory Visit: Payer: Self-pay | Admitting: Cardiovascular Disease

## 2018-11-04 MED ORDER — DILTIAZEM HCL ER COATED BEADS 120 MG PO CP24: 120.0000 mg | ORAL_CAPSULE | Freq: Every day | ORAL | 0 refills | Status: DC

## 2018-11-04 NOTE — Telephone Encounter (Signed)
Pt's medications were sent to pt's pharmacy as requested. Confirmation received.  

## 2018-12-03 ENCOUNTER — Other Ambulatory Visit: Payer: Self-pay | Admitting: Cardiovascular Disease

## 2018-12-05 ENCOUNTER — Other Ambulatory Visit: Payer: Self-pay

## 2018-12-05 ENCOUNTER — Other Ambulatory Visit: Payer: Self-pay | Admitting: Cardiovascular Disease

## 2018-12-05 MED ORDER — DILTIAZEM HCL ER COATED BEADS 120 MG PO CP24: 120.0000 mg | ORAL_CAPSULE | Freq: Every day | ORAL | 0 refills | Status: DC

## 2018-12-05 MED ORDER — LISINOPRIL 40 MG PO TABS: 40.0000 mg | ORAL_TABLET | Freq: Every day | ORAL | 0 refills | Status: DC

## 2018-12-11 ENCOUNTER — Emergency Department (HOSPITAL_COMMUNITY)
Admission: EM | Admit: 2018-12-11 | Discharge: 2018-12-12 | Disposition: A | Discharge status: Home / Self Care | Payer: 59 | Attending: Emergency Medicine | Admitting: Emergency Medicine

## 2018-12-11 ENCOUNTER — Other Ambulatory Visit: Payer: Self-pay

## 2018-12-11 ENCOUNTER — Encounter (HOSPITAL_COMMUNITY): Payer: Self-pay

## 2018-12-11 DIAGNOSIS — Z7982 Long term (current) use of aspirin: Secondary | ICD-10-CM | POA: Diagnosis not present

## 2018-12-11 DIAGNOSIS — Z202 Contact with and (suspected) exposure to infections with a predominantly sexual mode of transmission: Secondary | ICD-10-CM | POA: Diagnosis not present

## 2018-12-11 DIAGNOSIS — Z79899 Other long term (current) drug therapy: Secondary | ICD-10-CM | POA: Insufficient documentation

## 2018-12-11 DIAGNOSIS — I10 Essential (primary) hypertension: Secondary | ICD-10-CM | POA: Diagnosis not present

## 2018-12-11 DIAGNOSIS — L853 Xerosis cutis: Secondary | ICD-10-CM | POA: Diagnosis present

## 2018-12-11 NOTE — ED Triage Notes (Addendum)
Patient arrived stating his partner tested positive for gonorrhea and chlamydia. States that he also has had skin issues for over a year that he would like to be evaluated. Denies any symptoms at this time.

## 2018-12-12 MED ORDER — CEFTRIAXONE SODIUM 250 MG IJ SOLR
250.0000 mg | Freq: Once | INTRAMUSCULAR | Status: AC
  Administered 2018-12-12: 250 mg via INTRAMUSCULAR
  Filled 2018-12-12: qty 250

## 2018-12-12 MED ORDER — AZITHROMYCIN 250 MG PO TABS
1000.0000 mg | ORAL_TABLET | Freq: Once | ORAL | Status: AC
  Administered 2018-12-12: 1000 mg via ORAL
  Filled 2018-12-12: qty 4

## 2018-12-12 MED ORDER — METRONIDAZOLE 500 MG PO TABS
2000.0000 mg | ORAL_TABLET | Freq: Once | ORAL | Status: AC
  Administered 2018-12-12: 01:00:00 2000 mg via ORAL
  Filled 2018-12-12: qty 4

## 2018-12-12 MED ORDER — STERILE WATER FOR INJECTION IJ SOLN
INTRAMUSCULAR | Status: AC
  Administered 2018-12-12: 10 mL via INTRAMUSCULAR
  Filled 2018-12-12: qty 10

## 2018-12-12 MED ORDER — CETAPHIL MOISTURIZING EX CREA: TOPICAL_CREAM | CUTANEOUS | 0 refills | Status: DC | PRN

## 2018-12-12 NOTE — Discharge Instructions (Signed)
You have been treated for STD's here.  We do recommend that you remain abstinent for at least 1 week to let medications take effect to prevent asymptomatic spread. Can use the Cetaphil on your face.  Will be very important to keep skin moisturized socially during winter months. Recommend to follow-up with primary care doctor to have full physical.  You can call the 800-number on paperwork or number on back of your insurance card to help find a physician in your network. Return here for any new or acute changes.
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
## 2018-12-20 NOTE — Progress Notes (Signed)
Virtual Visit via Telephone Note   This visit type was conducted due to national recommendations for restrictions regarding the COVID-19 Pandemic (e.g. social distancing) in an effort to limit this patient's exposure and mitigate transmission in our community.  Due to his co-morbid illnesses, this patient is at least at moderate risk for complications without adequate follow up.  This format is felt to be most appropriate for this patient at this time.  The patient did not have access to video technology/had technical difficulties with video requiring transitioning to audio format only (telephone).  All issues noted in this document were discussed and addressed.  No physical exam could be performed with this format.  Please refer to the patient's chart for his  consent to telehealth for [facility]   Date:  12/21/2018   ID:  Nicholas Hughes, DOB 1971/01/17, MRN [id number]  Patient Location: Home Provider Location: Office  PCP:  Patient, No Pcp Per  Cardiologist:  Mertie Moores, MD   Electrophysiologist:  None   Evaluation Performed:  Follow-Up Visit  Chief Complaint:  Hypertension  History of Present Illness:    Nicholas Hughes is a 48 y.o. male with   Hypertension   LVH  parox AFib  CHA2DS2-VASc=1 ( no anticoagulation)  OSA   He was last seen by Dr. Acie Fredrickson in 10/2017.    He is seen for annual follow-up.  Since last seen, he has been doing fairly well.  He has not had chest pain, shortness of breath, headache, syncope, orthopnea or lower extremity swelling.  He was previously diagnosed with sleep apnea but was not set up with CPAP due to lack of insurance.  He now has insurance and would like to pursue CPAP therapy.  He tries to maintain a low-salt diet but does admit to eating some fried foods at times.    Past Medical History:  Diagnosis Date  . ED (erectile dysfunction)   . GERD (gastroesophageal reflux disease)   . Hypertension   . LVH (left ventricular  hypertrophy)   . OSA (obstructive sleep apnea) 07/14/2014   Moderate OSA with AHI 20/hr  . Renal insufficiency   . Transient atrial fibrillation or flutter    Past Surgical History:  Procedure Laterality Date  . TRANSTHORACIC ECHOCARDIOGRAM  05/07/2009   EF 60-65%     Current Meds  Medication Sig  . aspirin EC 81 MG tablet Take 1 tablet (81 mg total) by mouth daily.  [name] diltiazem (CARTIA XT) 120 MG 24 hr capsule Take 1 capsule (120 mg total) by mouth daily.  . Emollient (CETAPHIL) cream Apply topically as needed.  [name] lisinopril (ZESTRIL) 40 MG tablet Take 1 tablet (40 mg total) by mouth daily. Keep upcoming appt  . triamterene-hydrochlorothiazide (MAXZIDE) 75-50 MG tablet Take 1 tablet by mouth daily.  . [DISCONTINUED] carvedilol (COREG) 3.125 MG tablet Take 1 tablet (3.125 mg total) by mouth 2 (two) times daily with a meal.  . [DISCONTINUED] diltiazem (CARTIA XT) 120 MG 24 hr capsule Take 1 capsule (120 mg total) by mouth daily. Please keep upcoming appt  . [DISCONTINUED] lisinopril (ZESTRIL) 40 MG tablet Take 1 tablet (40 mg total) by mouth daily. Keep upcoming appt     Allergies:   Patient has no known allergies.   Social History   Tobacco Use  . Smoking status: Never Smoker  . Smokeless tobacco: Never Used  Substance Use Topics  . Alcohol use: Yes    Comment: occasionally  . Drug use: No  Family Hx: The patient's family history includes Coronary artery disease in his father; Hypertension in his father.  ROS:   Please see the history of present illness.     All other systems reviewed and are negative.   Prior CV studies:   The following studies were reviewed today:  Echocardiogram 04/2009 EF 60-65, mild MR, mild LAE, PASP 31  Labs/Other Tests and Data Reviewed:    EKG:  An ECG dated 02/28/2018 was personally reviewed today and demonstrated:  Normal sinus rhythm, heart rate 73, normal axis, LVH with T wave inversions in 2, 3, aVF, V3-V6 (repolarization  abnormality), QTC 406  Recent Labs: 02/28/2018: BUN 12; Creatinine, Ser 1.25; Hemoglobin 14.3; Platelets 275; Potassium 3.6; Sodium 137   Recent Lipid Panel Lab Results  Component Value Date/Time   CHOL 158 03/07/2014 09:23 AM   TRIG 94.0 03/07/2014 09:23 AM   HDL 45.80 03/07/2014 09:23 AM   CHOLHDL 3 03/07/2014 09:23 AM   LDLCALC 93 03/07/2014 09:23 AM    Wt Readings from Last 3 Encounters:  12/21/18 253 lb (114.8 kg)  12/11/18 255 lb (115.7 kg)  02/09/18 263 lb (119.3 kg)     Objective:    Vital Signs:  BP (!) 145/90   Pulse 78   Ht 6' (1.829 m)   Wt 253 lb (114.8 kg)   BMI 34.31 kg/m    VITAL SIGNS:  reviewed GEN:  no acute distress RESPIRATORY:  No labored breathing NEURO:  Alert and oriented PSYCH:  Normal mood  ASSESSMENT & PLAN:    1. Essential hypertension Blood pressure remains above target.  We discussed the importance of low-sodium diet and some strategies for reducing sodium in his diet.  Also, starting CPAP therapy should help with blood pressure control.  I have asked him to increase his carvedilol to 6.25 mg twice daily.  Obtain follow-up CMET, fasting lipids.  Schedule follow-up with Dr. Acie Fredrickson or me in 6 months.  2. PAF (paroxysmal atrial fibrillation) (HCC) No apparent recurrence.  His thromboembolic risk profile is low and he is not on long-term anticoagulation.  3. OSA (obstructive sleep apnea) As noted, he is not currently treated with CPAP as he could not afford CPAP therapy.  Now that he has insurance, we will refer for split-night sleep study.   Time:   Today, I have spent 12 minutes with the patient with telehealth technology discussing the above problems.     Medication Adjustments/Labs and Tests Ordered: Current medicines are reviewed at length with the patient today.  Concerns regarding medicines are outlined above.   Tests Ordered: Orders Placed This Encounter  Procedures  . Comp Met (CMET)  . Lipid panel  . Home sleep test     Medication Changes: Meds ordered this encounter  Medications  . lisinopril (ZESTRIL) 40 MG tablet    Sig: Take 1 tablet (40 mg total) by mouth daily. Keep upcoming appt    Dispense:  30 tablet    Refill:  11  . diltiazem (CARTIA XT) 120 MG 24 hr capsule    Sig: Take 1 capsule (120 mg total) by mouth daily.    Dispense:  30 capsule    Refill:  11  . carvedilol (COREG) 6.25 MG tablet    Sig: Take 1 tablet (6.25 mg total) by mouth 2 (two) times daily.    Dispense:  180 tablet    Refill:  3    Follow Up:  In Person in 6 month(s)  Signed, Richardson Dopp, PA-C  12/21/2018 6:00 PM    [place]

## 2018-12-21 ENCOUNTER — Other Ambulatory Visit: Payer: Self-pay

## 2018-12-21 ENCOUNTER — Telehealth: Payer: Self-pay | Admitting: *Deleted

## 2018-12-21 ENCOUNTER — Telehealth (INDEPENDENT_AMBULATORY_CARE_PROVIDER_SITE_OTHER): Payer: 59 | Admitting: Physician Assistant

## 2018-12-21 ENCOUNTER — Encounter: Payer: Self-pay | Admitting: Physician Assistant

## 2018-12-21 VITALS — BP 145/90 | HR 78 | Ht 72.0 in | Wt 253.0 lb

## 2018-12-21 DIAGNOSIS — I10 Essential (primary) hypertension: Secondary | ICD-10-CM | POA: Diagnosis not present

## 2018-12-21 DIAGNOSIS — I480 Paroxysmal atrial fibrillation: Secondary | ICD-10-CM

## 2018-12-21 DIAGNOSIS — G4733 Obstructive sleep apnea (adult) (pediatric): Secondary | ICD-10-CM

## 2018-12-21 MED ORDER — LISINOPRIL 40 MG PO TABS: 40.0000 mg | ORAL_TABLET | Freq: Every day | ORAL | 11 refills | Status: DC

## 2018-12-21 MED ORDER — CARVEDILOL 6.25 MG PO TABS: 6.2500 mg | ORAL_TABLET | Freq: Two times a day (BID) | ORAL | 3 refills | Status: DC

## 2018-12-21 MED ORDER — DILTIAZEM HCL ER COATED BEADS 120 MG PO CP24: 120.0000 mg | ORAL_CAPSULE | Freq: Every day | ORAL | 11 refills | Status: DC

## 2018-12-21 NOTE — Patient Instructions (Addendum)
Medication Instructions:  INCREASE: Carvedilol to 6.25 mg twice a day   *If you need a refill on your cardiac medications before your next appointment, please call your pharmacy*  Lab Work: FUTURE: CMET & LIPIDS   If you have labs (blood work) drawn today and your tests are completely normal, you will receive your results only by: [name] MyChart Message (if you have MyChart) OR . A paper copy in the mail If you have any lab test that is abnormal or we need to change your treatment, we will call you to review the results.  Testing/Procedures: None   Follow-Up: At [facility], you and your health needs are our priority.  As part of our continuing mission to provide you with exceptional heart care, we have created designated Provider Care Teams.  These Care Teams include your primary Cardiologist (physician) and Advanced Practice Providers (APPs -  Physician Assistants and Nurse Practitioners) who all work together to provide you with the care you need, when you need it.  Your next appointment:   6 month(s)  The format for your next appointment:   In Person  Provider:   Richardson Dopp, PA-C or Dr. Acie Fredrickson   Other Instructions

## 2018-12-21 NOTE — Telephone Encounter (Signed)
-----   Message from Mendel Ryder, [place] sent at 12/21/2018  4:30 PM EST ----- Regarding: Sleep Pt needs sleep study   Dx: OSA

## 2018-12-26 ENCOUNTER — Telehealth: Payer: Self-pay | Admitting: *Deleted

## 2018-12-26 ENCOUNTER — Other Ambulatory Visit: Payer: 59

## 2018-12-26 NOTE — Telephone Encounter (Signed)
Staff message sent to Gae Bon ok to schedule HST. Patient's insurance does not require getting a PA.

## 2019-01-02 ENCOUNTER — Telehealth: Payer: Self-pay | Admitting: *Deleted

## 2019-01-02 NOTE — Telephone Encounter (Signed)
-----   Message from Lauralee Evener, [place] sent at 12/26/2018  3:49 PM EST ----- Regarding: RE: Sleep Ok to schedule HST no PA is required. ----- Message ----- From: Mendel Ryder, CMA Sent: 12/21/2018   4:30 PM EST To: Freada Bergeron, CMA, [facility] Sleep Studies Subject: Sleep                                          Pt needs sleep study   Dx: OSA

## 2019-01-02 NOTE — Telephone Encounter (Signed)
Patient is aware and agreeable to Home Sleep Study through [facility]. Patient is scheduled for 02/23/19 at 10:30 to pick up home sleep kit and meet with Respiratory therapist at [facility]. Patient is aware that if this appointment date and time does not work for them they should contact Artis Delay directly at [phone]. Patient is aware that a sleep packet will be sent from [facility] in week. Left detailed message on voicemail with date and time of titration and informed patient to call back to confirm or reschedule.

## 2019-01-25 ENCOUNTER — Other Ambulatory Visit: Payer: Self-pay | Admitting: Cardiovascular Disease

## 2019-02-04 ENCOUNTER — Encounter (HOSPITAL_COMMUNITY): Payer: Self-pay | Admitting: Emergency Medicine

## 2019-02-04 ENCOUNTER — Other Ambulatory Visit: Payer: Self-pay

## 2019-02-04 ENCOUNTER — Emergency Department (HOSPITAL_COMMUNITY)
Admission: EM | Admit: 2019-02-04 | Discharge: 2019-02-04 | Disposition: A | Discharge status: Home / Self Care | Payer: 59 | Attending: Emergency Medicine | Admitting: Emergency Medicine

## 2019-02-04 DIAGNOSIS — R140 Abdominal distension (gaseous): Secondary | ICD-10-CM | POA: Insufficient documentation

## 2019-02-04 DIAGNOSIS — K219 Gastro-esophageal reflux disease without esophagitis: Secondary | ICD-10-CM | POA: Insufficient documentation

## 2019-02-04 DIAGNOSIS — I10 Essential (primary) hypertension: Secondary | ICD-10-CM | POA: Insufficient documentation

## 2019-02-04 LAB — LIPASE, BLOOD: Lipase: 28 U/L (ref 11–51)

## 2019-02-04 LAB — URINALYSIS, ROUTINE W REFLEX MICROSCOPIC
Bilirubin Urine: NEGATIVE
Glucose, UA: NEGATIVE mg/dL
Hgb urine dipstick: NEGATIVE
Ketones, ur: NEGATIVE mg/dL
Leukocytes,Ua: NEGATIVE
Nitrite: NEGATIVE
Protein, ur: NEGATIVE mg/dL
Specific Gravity, Urine: 1.016 (ref 1.005–1.030)
pH: 6 (ref 5.0–8.0)

## 2019-02-04 LAB — COMPREHENSIVE METABOLIC PANEL
ALT: 31 U/L (ref 0–44)
AST: 30 U/L (ref 15–41)
Albumin: 4 g/dL (ref 3.5–5.0)
Alkaline Phosphatase: 50 U/L (ref 38–126)
Anion gap: 8 (ref 5–15)
BUN: 19 mg/dL (ref 6–20)
CO2: 26 mmol/L (ref 22–32)
Calcium: 9 mg/dL (ref 8.9–10.3)
Chloride: 101 mmol/L (ref 98–111)
Creatinine, Ser: 1.33 mg/dL — ABNORMAL HIGH (ref 0.61–1.24)
GFR calc Af Amer: >60 mL/min (ref >60)
GFR calc non Af Amer: >60 mL/min (ref >60)
Glucose, Bld: 111 mg/dL — ABNORMAL HIGH (ref 70–99)
Potassium: 3.5 mmol/L (ref 3.5–5.1)
Sodium: 135 mmol/L (ref 135–145)
Total Bilirubin: 0.4 mg/dL (ref 0.3–1.2)
Total Protein: 7.8 g/dL (ref 6.5–8.1)

## 2019-02-04 LAB — CBC
HCT: 44.3 % (ref 39.0–52.0)
Hemoglobin: 14.4 g/dL (ref 13.0–17.0)
MCH: 28.4 pg (ref 26.0–34.0)
MCHC: 32.5 g/dL (ref 30.0–36.0)
MCV: 87.4 fL (ref 80.0–100.0)
Platelets: 248 10*3/uL (ref 150–400)
RBC: 5.07 MIL/uL (ref 4.22–5.81)
RDW: 11.9 % (ref 11.5–15.5)
WBC: 6.2 10*3/uL (ref 4.0–10.5)
nRBC: 0 % (ref 0.0–0.2)

## 2019-02-04 MED ORDER — SUCRALFATE 1 G PO TABS: 1.0000 g | ORAL_TABLET | Freq: Four times a day (QID) | ORAL | 0 refills | Status: DC | PRN

## 2019-02-04 MED ORDER — SODIUM CHLORIDE 0.9% FLUSH: 3.0000 mL | Freq: Once | INTRAVENOUS | Status: DC

## 2019-02-04 NOTE — ED Provider Notes (Signed)
[facility]-EMERGENCY DEPT [facility] Emergency Department Provider Note MRN:  [id number]  Arrival date & time: 02/04/19     Chief Complaint   Abdominal Pain   History of Present Illness   Nicholas Hughes is a 49 y.o. year-old male with a history of GERD presenting to the ED with chief complaint of abdominal pain.  Patient explains that sometimes he eats too quickly and becomes very bloated.  Has been happening on and off for months.  Denies pain.  No abdominal pain, no chest pain, no shortness of breath, no fever, no vomiting, no diarrhea.  Feels like the left side of his belly is swollen compared to the right.  Review of Systems  A complete 10 system review of systems was obtained and all systems are negative except as noted in the HPI and PMH.   Patient's Health History    Past Medical History:  Diagnosis Date  . ED (erectile dysfunction)   . GERD (gastroesophageal reflux disease)   . Hypertension   . LVH (left ventricular hypertrophy)   . OSA (obstructive sleep apnea) 07/14/2014   Moderate OSA with AHI 20/hr  . Renal insufficiency   . Transient atrial fibrillation or flutter     Past Surgical History:  Procedure Laterality Date  . TRANSTHORACIC ECHOCARDIOGRAM  05/07/2009   EF 60-65%    Family History  Problem Relation Age of Onset  . Hypertension Father   . Coronary artery disease Father     Social History   Socioeconomic History  . Marital status: Single    Spouse name: Not on file  . Number of children: Not on file  . Years of education: Not on file  . Highest education level: Not on file  Occupational History  . Not on file  Tobacco Use  . Smoking status: Never Smoker  . Smokeless tobacco: Never Used  Substance and Sexual Activity  . Alcohol use: Yes    Comment: occasionally  . Drug use: No  . Sexual activity: Not on file  Other Topics Concern  . Not on file  Social History Narrative  . Not on file   Social Determinants of Health   Financial  Resource Strain:   . Difficulty of Paying Living Expenses: Not on file  Food Insecurity:   . Worried About Programme researcher, broadcasting/film/video in the Last Year: Not on file  . Ran Out of Food in the Last Year: Not on file  Transportation Needs:   . Lack of Transportation (Medical): Not on file  . Lack of Transportation (Non-Medical): Not on file  Physical Activity:   . Days of Exercise per Week: Not on file  . Minutes of Exercise per Session: Not on file  Stress:   . Feeling of Stress : Not on file  Social Connections:   . Frequency of Communication with Friends and Family: Not on file  . Frequency of Social Gatherings with Friends and Family: Not on file  . Attends Religious Services: Not on file  . Active Member of Clubs or Organizations: Not on file  . Attends Banker Meetings: Not on file  . Marital Status: Not on file  Intimate Partner Violence:   . Fear of Current or Ex-Partner: Not on file  . Emotionally Abused: Not on file  . Physically Abused: Not on file  . Sexually Abused: Not on file     Physical Exam  Vital Signs and Nursing Notes reviewed Vitals:   02/04/19 1533  BP: (!) 152/101  Pulse: 89  Resp: 18  Temp: 99.9 F (37.7 C)  SpO2: 99%    CONSTITUTIONAL: Well-appearing, NAD NEURO:  Alert and oriented x 3, no focal deficits EYES:  eyes equal and reactive ENT/NECK:  no LAD, no JVD CARDIO: Regular rate, well-perfused, normal S1 and S2 PULM:  CTAB no wheezing or rhonchi GI/GU:  normal bowel sounds, non-distended, non-tender MSK/SPINE:  No gross deformities, no edema SKIN:  no rash, atraumatic PSYCH:  Appropriate speech and behavior  Diagnostic and Interventional Summary    EKG Interpretation  Date/Time:    Ventricular Rate:    PR Interval:    QRS Duration:   QT Interval:    QTC Calculation:   R Axis:     Text Interpretation:        Labs Reviewed  COMPREHENSIVE METABOLIC PANEL - Abnormal; Notable for the following components:      Result Value    Glucose, Bld 111 (*)    Creatinine, Ser 1.33 (*)    All other components within normal limits  URINALYSIS, ROUTINE W REFLEX MICROSCOPIC - Abnormal; Notable for the following components:   Color, Urine STRAW (*)    All other components within normal limits  LIPASE, BLOOD  CBC    No orders to display    Medications  sodium chloride flush (NS) 0.9 % injection 3 mL (has no administration in time range)     Procedures  /  Critical Care Procedures  ED Course and Medical Decision Making  I have reviewed the triage vital signs, the nursing notes, and pertinent available records from the EMR.  Pertinent labs & imaging results that were available during my care of the patient were reviewed by me and considered in my medical decision making (see below for details).     Normal vital signs, benign abdomen, soft, nontender, moving bowels normally, no fever, no vomiting.  Patient is describing a bloating sensation when eating a large meal too quickly, and thus his presentation does not seem emergent.  Very reassuring labs, appropriate for PCP follow-up.    Barth Kirks. Sedonia Small, [place] [email or web address]  Final Clinical Impressions(s) / ED Diagnoses     ICD-10-CM   1. Bloating  R14.0   2. Gastroesophageal reflux disease without esophagitis  K21.9     ED Discharge Orders         Ordered    sucralfate (CARAFATE) 1 g tablet  4 times daily PRN     02/04/19 2019           Discharge Instructions Discussed with and Provided to Patient:     Discharge Instructions     You were evaluated in the Emergency Department and after careful evaluation, we did not find any emergent condition requiring admission or further testing in the hospital.  Your exam/testing today was overall reassuring.  Please return to the Emergency Department if you experience any worsening of your condition.  We encourage you to follow up with a primary care  provider.  Thank you for allowing [place] to be a part of your care.       Maudie Flakes, MD 02/04/19 2022

## 2019-02-04 NOTE — ED Triage Notes (Signed)
Pt reports been having abd pains and acid reflux for years. Reports having different areas of swelling that will come on in body. Denies pains at this time.

## 2019-02-04 NOTE — ED Notes (Signed)
An After Visit Summary was printed and given to the patient. Discharge instructions given and no further questions at this time.  

## 2019-02-04 NOTE — Discharge Instructions (Addendum)
You were evaluated in the Emergency Department and after careful evaluation, we did not find any emergent condition requiring admission or further testing in the hospital. ° °Your exam/testing today was overall reassuring. ° °Please return to the Emergency Department if you experience any worsening of your condition.  We encourage you to follow up with a primary care provider.  Thank you for allowing us to be a part of your care. ° °

## 2019-02-13 ENCOUNTER — Other Ambulatory Visit: Payer: Self-pay

## 2019-02-14 ENCOUNTER — Ambulatory Visit (INDEPENDENT_AMBULATORY_CARE_PROVIDER_SITE_OTHER): Payer: BC Managed Care – PPO | Admitting: Family Medicine

## 2019-02-14 ENCOUNTER — Encounter: Payer: Self-pay | Admitting: Family Medicine

## 2019-02-14 VITALS — BP 128/80 | HR 73 | Temp 97.5°F | Ht 71.0 in | Wt 251.0 lb

## 2019-02-14 DIAGNOSIS — R140 Abdominal distension (gaseous): Secondary | ICD-10-CM | POA: Diagnosis not present

## 2019-02-14 DIAGNOSIS — R7309 Other abnormal glucose: Secondary | ICD-10-CM | POA: Diagnosis not present

## 2019-02-14 DIAGNOSIS — I10 Essential (primary) hypertension: Secondary | ICD-10-CM

## 2019-02-14 DIAGNOSIS — I4891 Unspecified atrial fibrillation: Secondary | ICD-10-CM | POA: Diagnosis not present

## 2019-02-14 DIAGNOSIS — R220 Localized swelling, mass and lump, head: Secondary | ICD-10-CM | POA: Diagnosis not present

## 2019-02-14 LAB — BASIC METABOLIC PANEL
BUN: 11 mg/dL (ref 6–23)
CO2: 27 mEq/L (ref 19–32)
Calcium: 9.2 mg/dL (ref 8.4–10.5)
Chloride: 104 mEq/L (ref 96–112)
Creatinine, Ser: 1.29 mg/dL (ref 0.40–1.50)
GFR: 71.81 mL/min (ref >60.00)
Glucose, Bld: 88 mg/dL (ref 70–99)
Potassium: 3.6 mEq/L (ref 3.5–5.1)
Sodium: 138 mEq/L (ref 135–145)

## 2019-02-14 LAB — HEMOGLOBIN A1C: Hgb A1c MFr Bld: 6.5 % (ref 4.6–6.5)

## 2019-02-14 LAB — TSH: TSH: 0.77 u[IU]/mL (ref 0.35–4.50)

## 2019-02-14 MED ORDER — DILTIAZEM HCL ER COATED BEADS 300 MG PO CP24: 300.0000 mg | ORAL_CAPSULE | Freq: Every day | ORAL | 2 refills | Status: DC

## 2019-02-14 NOTE — Progress Notes (Signed)
New Patient Office Visit  Subjective:  Patient ID: Nicholas Hughes, male    DOB: 1971/01/10  Age: 49 y.o. MRN: [id number]  CC:  Chief Complaint  Patient presents with  . Establish Care    c/o swelling on different parts of body x 2 weeks and possibly over eating.     HPI Nicholas Hughes presents for establishment of care and follow-up for some medical issues that he has been having.  Significant past medical history of hypertension with transient atrial fibrillation.  Currently seeing cardiology for this and they have determined that he is in not in need of anticoagulation.  More recently he has been experiencing swelling about his face and body.  Denies swelling around his lips.  Denies chest pain or shortness of breath or swelling in his throat.  He also has been dealing with some abdominal bloating.  He is avoiding dairy products and trying to change his diet to be more plant-based.  He has noted that rolls of fat are not symmetrical about his body as well.  Review of emergency room note shows a mildly elevated glucose during an emergency room visit a few weeks ago.  History of OSA and has follow-up appointment scheduled with the sleep study doc in a few weeks.  To see cardiology for follow-up next week  Past Medical History:  Diagnosis Date  . ED (erectile dysfunction)   . GERD (gastroesophageal reflux disease)   . Hypertension   . LVH (left ventricular hypertrophy)   . OSA (obstructive sleep apnea) 07/14/2014   Moderate OSA with AHI 20/hr  . Renal insufficiency   . Transient atrial fibrillation or flutter     Past Surgical History:  Procedure Laterality Date  . TRANSTHORACIC ECHOCARDIOGRAM  05/07/2009   EF 60-65%    Family History  Problem Relation Age of Onset  . Hypertension Father   . Coronary artery disease Father     Social History   Socioeconomic History  . Marital status: Single    Spouse name: Not on file  . Number of children: Not on file  . Years of  education: Not on file  . Highest education level: Not on file  Occupational History  . Not on file  Tobacco Use  . Smoking status: Never Smoker  . Smokeless tobacco: Never Used  Substance and Sexual Activity  . Alcohol use: Not Currently    Comment: occasionally  . Drug use: No  . Sexual activity: Yes  Other Topics Concern  . Not on file  Social History Narrative  . Not on file   Social Determinants of Health   Financial Resource Strain:   . Difficulty of Paying Living Expenses: Not on file  Food Insecurity:   . Worried About Charity fundraiser in the Last Year: Not on file  . Ran Out of Food in the Last Year: Not on file  Transportation Needs:   . Lack of Transportation (Medical): Not on file  . Lack of Transportation (Non-Medical): Not on file  Physical Activity:   . Days of Exercise per Week: Not on file  . Minutes of Exercise per Session: Not on file  Stress:   . Feeling of Stress : Not on file  Social Connections:   . Frequency of Communication with Friends and Family: Not on file  . Frequency of Social Gatherings with Friends and Family: Not on file  . Attends Religious Services: Not on file  . Active Member of Clubs or Organizations: Not  on file  . Attends Banker Meetings: Not on file  . Marital Status: Not on file  Intimate Partner Violence:   . Fear of Current or Ex-Partner: Not on file  . Emotionally Abused: Not on file  . Physically Abused: Not on file  . Sexually Abused: Not on file    ROS Review of Systems  Constitutional: Negative.   HENT: Negative.   Eyes: Negative for visual disturbance.  Respiratory: Negative.   Cardiovascular: Negative.   Gastrointestinal: Negative.   Endocrine: Negative for polyphagia and polyuria.  Genitourinary: Negative.   Musculoskeletal: Negative for joint swelling and myalgias.  Skin: Negative for pallor and rash.  Allergic/Immunologic: Negative for immunocompromised state.  Neurological: Negative for  tremors and light-headedness.  Hematological: Does not bruise/bleed easily.  Psychiatric/Behavioral: Negative.     Objective:   Today's Vitals: BP 128/80   Pulse 73   Temp (!) 97.5 F (36.4 C) (Tympanic)   Ht 5\' 11"  (1.803 m)   Wt 251 lb (113.9 kg)   SpO2 99%   BMI 35.01 kg/m   Physical Exam Vitals and nursing note reviewed.  Constitutional:      General: He is not in acute distress.    Appearance: Normal appearance. He is not ill-appearing, toxic-appearing or diaphoretic.  HENT:     Head: Normocephalic and atraumatic.     Right Ear: External ear normal.     Left Ear: External ear normal.  Eyes:     General: No scleral icterus.       Right eye: No discharge.        Left eye: No discharge.     Conjunctiva/sclera: Conjunctivae normal.  Cardiovascular:     Rate and Rhythm: Normal rate and regular rhythm.  Pulmonary:     Effort: Pulmonary effort is normal.     Breath sounds: Normal breath sounds.  Abdominal:     General: Bowel sounds are normal.    Skin:      Neurological:     Mental Status: He is alert and oriented to person, place, and time.  Psychiatric:        Mood and Affect: Mood normal.        Behavior: Behavior normal.     Assessment & Plan:   Problem List Items Addressed This Visit      Cardiovascular and Mediastinum   Essential hypertension   Relevant Medications   diltiazem (CARDIZEM CD) 300 MG 24 hr capsule   Other Relevant Orders   TSH   Basic metabolic panel   Atrial fibrillation, transient (HCC)   Relevant Medications   diltiazem (CARDIZEM CD) 300 MG 24 hr capsule   Other Relevant Orders   TSH     Other   Swelling of face - Primary   Abdominal bloating   Elevated glucose   Relevant Orders   Hemoglobin A1c      Outpatient Encounter Medications as of 02/14/2019  Medication Sig  . sucralfate (CARAFATE) 1 g tablet Take 1 tablet (1 g total) by mouth 4 (four) times daily as needed.  . triamterene-hydrochlorothiazide (MAXZIDE) 75-50  MG tablet Take 1 tablet by mouth once daily  . [DISCONTINUED] diltiazem (CARTIA XT) 120 MG 24 hr capsule Take 1 capsule (120 mg total) by mouth daily.  . [DISCONTINUED] lisinopril (ZESTRIL) 40 MG tablet Take 1 tablet (40 mg total) by mouth daily. Keep upcoming appt  . aspirin EC 81 MG tablet Take 1 tablet (81 mg total) by mouth daily. (Patient not taking:  Reported on 02/14/2019)  . carvedilol (COREG) 6.25 MG tablet Take 1 tablet (6.25 mg total) by mouth 2 (two) times daily. (Patient not taking: Reported on 02/14/2019)  . diltiazem (CARDIZEM CD) 300 MG 24 hr capsule Take 1 capsule (300 mg total) by mouth daily.  . Emollient (CETAPHIL) cream Apply topically as needed. (Patient not taking: Reported on 02/14/2019)   No facility-administered encounter medications on file as of 02/14/2019.    Follow-up: Return in about 3 months (around 05/15/2019).   I discontinued lisinopril and hopefully this will relieve the swelling he has been having in his face.  Have increased the Cardizem CD to 300 mg daily from 120 mg daily.  He will follow-up with cardiology next week.  Asked him to let them know about the change made.  Return in 3 months for a physical.  Mliss Sax, MD

## 2019-02-14 NOTE — Patient Instructions (Signed)
Abdominal Bloating When you have abdominal bloating, your abdomen may feel full, tight, or painful. It may also look bigger than normal or swollen (distended). Common causes of abdominal bloating include:  Swallowing air.  Constipation.  Problems digesting food.  Eating too much.  Irritable bowel syndrome. This is a condition that affects the large intestine.  Lactose intolerance. This is an inability to digest lactose, a natural sugar in dairy products.  Celiac disease. This is a condition that affects the ability to digest gluten, a protein found in some grains.  Gastroparesis. This is a condition that slows down the movement of food in the stomach and small intestine. It is more common in people with diabetes mellitus.  Gastroesophageal reflux disease (GERD). This is a digestive condition that makes stomach acid flow back into the esophagus.  Urinary retention. This means that the body is holding onto urine, and the bladder cannot be emptied all the way. Follow these instructions at home: Eating and drinking  Avoid eating too much.  Try not to swallow air while talking or eating.  Avoid eating while lying down.  Avoid these foods and drinks: ? Foods that cause gas, such as broccoli, cabbage, cauliflower, and baked beans. ? Carbonated drinks. ? Hard candy. ? Chewing gum. Medicines  Take over-the-counter and prescription medicines only as told by your health care provider.  Take probiotic medicines. These medicines contain live bacteria or yeasts that can help digestion.  Take coated peppermint oil capsules. Activity  Try to exercise regularly. Exercise may help to relieve bloating that is caused by gas and relieve constipation. General instructions  Keep all follow-up visits as told by your health care provider. This is important. Contact a health care provider if:  You have nausea and vomiting.  You have diarrhea.  You have abdominal pain.  You have unusual  weight loss or weight gain.  You have severe pain, and medicines do not help. Get help right away if:  You have severe chest pain.  You have trouble breathing.  You have shortness of breath.  You have trouble urinating.  You have darker urine than normal.  You have blood in your stools or have dark, tarry stools. Summary  Abdominal bloating means that the abdomen is swollen.  Common causes of abdominal bloating are swallowing air, constipation, and problems digesting food.  Avoid eating too much and avoid swallowing air.  Avoid foods that cause gas, carbonated drinks, hard candy, and chewing gum. This information is not intended to replace advice given to you by your health care provider. Make sure you discuss any questions you have with your health care provider. Document Revised: 05/02/2018 Document Reviewed: 02/14/2016 Elsevier Patient Education  2020 Elsevier Inc.  

## 2019-02-23 ENCOUNTER — Other Ambulatory Visit: Payer: Self-pay

## 2019-02-23 ENCOUNTER — Ambulatory Visit (HOSPITAL_BASED_OUTPATIENT_CLINIC_OR_DEPARTMENT_OTHER): Payer: BC Managed Care – PPO

## 2019-03-07 DIAGNOSIS — R21 Rash and other nonspecific skin eruption: Secondary | ICD-10-CM | POA: Diagnosis not present

## 2019-03-17 DIAGNOSIS — D171 Benign lipomatous neoplasm of skin and subcutaneous tissue of trunk: Secondary | ICD-10-CM | POA: Diagnosis not present

## 2019-03-17 DIAGNOSIS — L218 Other seborrheic dermatitis: Secondary | ICD-10-CM | POA: Diagnosis not present

## 2019-03-17 DIAGNOSIS — L308 Other specified dermatitis: Secondary | ICD-10-CM | POA: Diagnosis not present

## 2019-03-17 DIAGNOSIS — L700 Acne vulgaris: Secondary | ICD-10-CM | POA: Diagnosis not present

## 2019-03-17 DIAGNOSIS — D235 Other benign neoplasm of skin of trunk: Secondary | ICD-10-CM | POA: Diagnosis not present

## 2019-05-04 ENCOUNTER — Ambulatory Visit (HOSPITAL_BASED_OUTPATIENT_CLINIC_OR_DEPARTMENT_OTHER): Payer: BC Managed Care – PPO | Attending: Physician Assistant | Admitting: Cardiology

## 2019-05-15 ENCOUNTER — Ambulatory Visit: Payer: Self-pay | Admitting: Family Medicine

## 2019-05-17 ENCOUNTER — Other Ambulatory Visit: Payer: Self-pay | Admitting: Family Medicine

## 2019-05-17 DIAGNOSIS — I10 Essential (primary) hypertension: Secondary | ICD-10-CM

## 2019-05-17 DIAGNOSIS — I4891 Unspecified atrial fibrillation: Secondary | ICD-10-CM

## 2019-05-24 ENCOUNTER — Encounter (HOSPITAL_BASED_OUTPATIENT_CLINIC_OR_DEPARTMENT_OTHER): Payer: Self-pay | Admitting: *Deleted

## 2019-05-24 ENCOUNTER — Emergency Department (HOSPITAL_BASED_OUTPATIENT_CLINIC_OR_DEPARTMENT_OTHER)
Admission: EM | Admit: 2019-05-24 | Discharge: 2019-05-25 | Disposition: A | Discharge status: Home / Self Care | Payer: BC Managed Care – PPO | Attending: Emergency Medicine | Admitting: Emergency Medicine

## 2019-05-24 DIAGNOSIS — I10 Essential (primary) hypertension: Secondary | ICD-10-CM | POA: Diagnosis not present

## 2019-05-24 DIAGNOSIS — Z79899 Other long term (current) drug therapy: Secondary | ICD-10-CM | POA: Diagnosis not present

## 2019-05-24 NOTE — ED Triage Notes (Signed)
HTN, 170/112 PTA. Pt reports that he was taken off lisinopril 2 months ago due to swelling. Denies HA or CP at this time.

## 2019-05-25 NOTE — ED Provider Notes (Signed)
[facility] DE[facility] Provider Note: Georgena Spurling, MD, FACEP  CSN: [id number] MRN: [id number] ARRIVAL: 05/24/19 at 2124 ROOM: [place]  Hypertension   HISTORY OF PRESENT ILLNESS  05/25/19 12:58 AM Nicholas Hughes is a 49 y.o. male who was previously on lisinopril for hypertension but was taken off due to "swelling".  Yesterday after work he felt "bad" which he has difficulty describing.  He found his blood pressure to be as high as 170/112 and became concerned.  He took a friend's anxiety medication with improvement.  He denies headache, blurred vision, chest pain, shortness of breath, nausea or vomiting.  He is interested in getting back on blood pressure medication.  His current blood pressure is 138/98.   Past Medical History:  Diagnosis Date  . ED (erectile dysfunction)   . GERD (gastroesophageal reflux disease)   . Hypertension   . LVH (left ventricular hypertrophy)   . OSA (obstructive sleep apnea) 07/14/2014   Moderate OSA with AHI 20/hr  . Renal insufficiency   . Transient atrial fibrillation or flutter     Past Surgical History:  Procedure Laterality Date  . TRANSTHORACIC ECHOCARDIOGRAM  05/07/2009   EF 60-65%    Family History  Problem Relation Age of Onset  . Hypertension Father   . Coronary artery disease Father     Social History   Tobacco Use  . Smoking status: Never Smoker  . Smokeless tobacco: Never Used  Substance Use Topics  . Alcohol use: Not Currently    Comment: occasionally  . Drug use: No    Prior to Admission medications   Medication Sig Start Date End Date Taking? Authorizing Provider  CARTIA XT 300 MG 24 hr capsule Take 1 capsule by mouth once daily 05/17/19   Libby Maw, MD  sucralfate (CARAFATE) 1 g tablet Take 1 tablet (1 g total) by mouth 4 (four) times daily as needed. 02/04/19   Maudie Flakes, MD  triamterene-hydrochlorothiazide [facility]) 75-50 MG tablet Take 1 tablet by mouth once daily 01/25/19    Nahser, Wonda Cheng, MD  carvedilol (COREG) 6.25 MG tablet Take 1 tablet (6.25 mg total) by mouth 2 (two) times daily. Patient not taking: Reported on 02/14/2019 12/21/18 05/25/19  Richardson Dopp T, PA-C    Allergies Patient has no known allergies.   REVIEW OF SYSTEMS  Negative except as noted here or in the History of Present Illness.   PHYSICAL EXAMINATION  Initial Vital Signs Blood pressure (!) 145/98, pulse 62, temperature 98 F (36.7 C), temperature source Oral, resp. rate 16, height 6' (1.829 m), weight 117.1 kg, SpO2 96 %.  Examination General: Well-developed, well-nourished male in no acute distress; appearance consistent with age of record HENT: normocephalic; atraumatic Eyes: pupils equal, round and reactive to light; extraocular muscles intact Neck: supple Heart: regular rate and rhythm Lungs: clear to auscultation bilaterally Abdomen: soft; nondistended; nontender; no masses or hepatosplenomegaly; bowel sounds present Extremities: No deformity; full range of motion; pulses normal Neurologic: Awake, alert and oriented; motor function intact in all extremities and symmetric; no facial droop Skin: Warm and dry Psychiatric: Normal mood and affect   RESULTS  Summary of this visit's results, reviewed and interpreted by myself:   EKG Interpretation  Date/Time:    Ventricular Rate:    PR Interval:    QRS Duration:   QT Interval:    QTC Calculation:   R Axis:     Text Interpretation:        Laboratory  Studies: No results found for this or any previous visit (from the past 24 hour(s)). Imaging Studies: No results found.  ED COURSE and MDM  Nursing notes, initial and subsequent vitals signs, including pulse oximetry, reviewed and interpreted by myself.  Vitals:   05/24/19 2130 05/25/19 0000 05/25/19 0001 05/25/19 0045  BP: (!) 167/108 (!) 151/104  (!) 145/98  Pulse: 75  (!) 59 62  Resp: 16     Temp: 98 F (36.7 C)     TempSrc: Oral     SpO2: 99%  99% 96%    Weight: 117.1 kg     Height: 6' (1.829 m)      Medications - No data to display  The patient is already on Maxide and his blood pressure is close to normal here.  I do not believe that starting a new antihypertensive is indicated.  He was advised to log his blood pressure on a daily basis and follow-up with his PCP.  PROCEDURES  Procedures   ED DIAGNOSES     ICD-10-CM   1. Hypertension not at goal  I10        Cindee Mclester, Jonny Ruiz, MD 05/25/19 [id number]

## 2019-05-29 ENCOUNTER — Other Ambulatory Visit: Payer: Self-pay

## 2019-05-29 ENCOUNTER — Encounter: Payer: Self-pay | Admitting: Family Medicine

## 2019-05-29 ENCOUNTER — Ambulatory Visit (INDEPENDENT_AMBULATORY_CARE_PROVIDER_SITE_OTHER): Payer: BC Managed Care – PPO | Admitting: Family Medicine

## 2019-05-29 VITALS — BP 150/98 | HR 83 | Temp 98.5°F | Ht 73.0 in | Wt 256.4 lb

## 2019-05-29 DIAGNOSIS — R7309 Other abnormal glucose: Secondary | ICD-10-CM

## 2019-05-29 DIAGNOSIS — I4891 Unspecified atrial fibrillation: Secondary | ICD-10-CM

## 2019-05-29 DIAGNOSIS — R253 Fasciculation: Secondary | ICD-10-CM | POA: Insufficient documentation

## 2019-05-29 DIAGNOSIS — I10 Essential (primary) hypertension: Secondary | ICD-10-CM

## 2019-05-29 MED ORDER — DILTIAZEM HCL ER COATED BEADS 300 MG PO CP24: 300.0000 mg | ORAL_CAPSULE | Freq: Every day | ORAL | 1 refills | Status: DC

## 2019-05-29 MED ORDER — SPIRONOLACTONE 25 MG PO TABS: 25.0000 mg | ORAL_TABLET | Freq: Every day | ORAL | 1 refills | Status: DC

## 2019-05-29 MED ORDER — CHLORTHALIDONE 25 MG PO TABS: 25.0000 mg | ORAL_TABLET | Freq: Every day | ORAL | 1 refills | Status: DC

## 2019-05-29 NOTE — Progress Notes (Addendum)
Established Patient Office Visit  Subjective:  Patient ID: Nicholas Hughes, male    DOB: [birth year]  Age: 49 y.o. MRN: [id number]  CC:  Chief Complaint  Patient presents with  . Follow-up    3 month follow up on diabetes and BP, pt states that he still has swelling and BP still running high.     HPI Nicholas Hughes presents for follow-up of his blood pressure, elevated hemoglobin A1c for jumping areas about his body.  They commonly occur in his hands.  Denies numbness or tingling in his fingers.  Denies weakness.  Self perceived feeling of swelling about his body.  He had felt it in his face but that and is now resolved.  He feels it in his left pelvic area now.  Feels it also in his right upper abdomen area.  He uses a scanner at work.  Does not type on the computer.   Past Medical History:  Diagnosis Date  . ED (erectile dysfunction)   . GERD (gastroesophageal reflux disease)   . Hypertension   . LVH (left ventricular hypertrophy)   . OSA (obstructive sleep apnea) 07/14/2014   Moderate OSA with AHI 20/hr  . Renal insufficiency   . Transient atrial fibrillation or flutter     Past Surgical History:  Procedure Laterality Date  . TRANSTHORACIC ECHOCARDIOGRAM  05/07/2009   EF 60-65%    Family History  Problem Relation Age of Onset  . Hypertension Father   . Coronary artery disease Father     Social History   Socioeconomic History  . Marital status: Single    Spouse name: Not on file  . Number of children: Not on file  . Years of education: Not on file  . Highest education level: Not on file  Occupational History  . Not on file  Tobacco Use  . Smoking status: Never Smoker  . Smokeless tobacco: Never Used  Substance and Sexual Activity  . Alcohol use: Not Currently    Comment: occasionally  . Drug use: No  . Sexual activity: Yes  Other Topics Concern  . Not on file  Social History Narrative  . Not on file   Social Determinants of Health   Financial  Resource Strain:   . Difficulty of Paying Living Expenses:   Food Insecurity:   . Worried About Charity fundraiser in the Last Year:   . Arboriculturist in the Last Year:   Transportation Needs:   . Film/video editor (Medical):   [name] Lack of Transportation (Non-Medical):   Physical Activity:   . Days of Exercise per Week:   . Minutes of Exercise per Session:   Stress:   . Feeling of Stress :   Social Connections:   . Frequency of Communication with Friends and Family:   . Frequency of Social Gatherings with Friends and Family:   . Attends Religious Services:   . Active Member of Clubs or Organizations:   . Attends Archivist Meetings:   [name] Marital Status:   Intimate Partner Violence:   . Fear of Current or Ex-Partner:   . Emotionally Abused:   [name] Physically Abused:   . Sexually Abused:     Outpatient Medications Prior to Visit  Medication Sig Dispense Refill  . CARTIA XT 300 MG 24 hr capsule Take 1 capsule by mouth once daily 30 capsule 0  . triamterene-hydrochlorothiazide (MAXZIDE) 75-50 MG tablet Take 1 tablet by mouth once daily 90 tablet 3  .  sucralfate (CARAFATE) 1 g tablet Take 1 tablet (1 g total) by mouth 4 (four) times daily as needed. (Patient not taking: Reported on 05/29/2019) 30 tablet 0   No facility-administered medications prior to visit.    No Known Allergies  ROS Review of Systems  Constitutional: Negative.   HENT: Negative.   Eyes: Negative for photophobia and visual disturbance.  Respiratory: Negative.  Negative for chest tightness and shortness of breath.   Cardiovascular: Negative.  Negative for palpitations.  Gastrointestinal: Negative.   Endocrine: Negative for polyphagia and polyuria.  Genitourinary: Negative.   Musculoskeletal: Negative for gait problem and joint swelling.  Skin: Negative for pallor and rash.  Neurological: Negative for weakness and numbness.  Hematological: Does not bruise/bleed easily.  Psychiatric/Behavioral:  Negative.       Objective:    Physical Exam  Constitutional: He is oriented to person, place, and time. He appears well-developed and well-nourished. No distress.  HENT:  Head: Normocephalic and atraumatic.  Right Ear: External ear normal.  Left Ear: External ear normal.  Eyes: Pupils are equal, round, and reactive to light. Conjunctivae are normal. Right eye exhibits no discharge. Left eye exhibits no discharge. No scleral icterus.  Neck: No JVD present. No tracheal deviation present.  Cardiovascular: Normal rate, regular rhythm and normal heart sounds.  Pulmonary/Chest: Effort normal and breath sounds normal. No stridor.  Abdominal: Bowel sounds are normal.  Musculoskeletal:        General: No edema.     Right hand: No swelling, deformity, tenderness or bony tenderness. Normal range of motion. Normal strength.     Left hand: No swelling, deformity, tenderness or bony tenderness. Normal range of motion. Normal strength.       Hands:  Neurological: He is alert and oriented to person, place, and time.  Skin: Skin is warm and dry. He is not diaphoretic.  Psychiatric: He has a normal mood and affect. His behavior is normal.    BP (!) 150/98   Pulse 83   Temp 98.5 F (36.9 C) (Tympanic)   Ht 6\' 1"  (1.854 m)   Wt 256 lb 6.4 oz (116.3 kg)   SpO2 97%   BMI 33.83 kg/m  Wt Readings from Last 3 Encounters:  05/29/19 256 lb 6.4 oz (116.3 kg)  05/24/19 258 lb 1.6 oz (117.1 kg)  02/23/19 250 lb (113.4 kg)     Health Maintenance Due  Topic Date Due  . HIV Screening  Never done  . COVID-19 Vaccine (1) Never done    There are no preventive care reminders to display for this patient.  Lab Results  Component Value Date   TSH 0.77 02/14/2019   Lab Results  Component Value Date   WBC 6.2 02/04/2019   HGB 14.4 02/04/2019   HCT 44.3 02/04/2019   MCV 87.4 02/04/2019   PLT 248 02/04/2019   Lab Results  Component Value Date   NA 138 02/14/2019   K 3.6 02/14/2019   CO2 27  02/14/2019   GLUCOSE 88 02/14/2019   BUN 11 02/14/2019   CREATININE 1.29 02/14/2019   BILITOT 0.4 02/04/2019   ALKPHOS 50 02/04/2019   AST 30 02/04/2019   ALT 31 02/04/2019   PROT 7.8 02/04/2019   ALBUMIN 4.0 02/04/2019   CALCIUM 9.2 02/14/2019   ANIONGAP 8 02/04/2019   GFR 71.81 02/14/2019   Lab Results  Component Value Date   CHOL 158 03/07/2014   Lab Results  Component Value Date   HDL 45.80 03/07/2014  Lab Results  Component Value Date   LDLCALC 93 03/07/2014   Lab Results  Component Value Date   TRIG 94.0 03/07/2014   Lab Results  Component Value Date   CHOLHDL 3 03/07/2014   Lab Results  Component Value Date   HGBA1C 6.5 02/14/2019      Assessment & Plan:   Problem List Items Addressed This Visit      Cardiovascular and Mediastinum   Essential hypertension - Primary   Relevant Medications   spironolactone (ALDACTONE) 25 MG tablet   chlorthalidone (HYGROTON) 25 MG tablet   diltiazem (CARTIA XT) 300 MG 24 hr capsule   Other Relevant Orders   Comprehensive metabolic panel   Atrial fibrillation, transient (HCC)   Relevant Medications   spironolactone (ALDACTONE) 25 MG tablet   chlorthalidone (HYGROTON) 25 MG tablet   diltiazem (CARTIA XT) 300 MG 24 hr capsule     Other   Elevated glucose   Relevant Orders   Hemoglobin A1c   Frequent fasciculation of limb muscle   Relevant Orders   Magnesium   Comprehensive metabolic panel   Ambulatory referral to Neurology      Meds ordered this encounter  Medications  . spironolactone (ALDACTONE) 25 MG tablet    Sig: Take 1 tablet (25 mg total) by mouth daily.    Dispense:  90 tablet    Refill:  1  . chlorthalidone (HYGROTON) 25 MG tablet    Sig: Take 1 tablet (25 mg total) by mouth daily.    Dispense:  90 tablet    Refill:  1  . diltiazem (CARTIA XT) 300 MG 24 hr capsule    Sig: Take 1 capsule (300 mg total) by mouth daily.    Dispense:  90 capsule    Refill:  1    Follow-up: Return in about 1  month (around 06/29/2019).   Have discontinued the Maxide started patient on Aldactone and chlorthalidone with the cardia.  Follow-up in 1 month for blood pressure check.  Blood pressure measured today per patient's wrist cuff was 157/103 which compares with our value today.  Patient assures me he will make a follow-up appointment with cardiology. Mliss Sax, MD

## 2019-05-30 LAB — COMPREHENSIVE METABOLIC PANEL
ALT: 23 U/L (ref 0–53)
AST: 23 U/L (ref 0–37)
Albumin: 4.2 g/dL (ref 3.5–5.2)
Alkaline Phosphatase: 51 U/L (ref 39–117)
BUN: 19 mg/dL (ref 6–23)
CO2: 31 mEq/L (ref 19–32)
Calcium: 9.3 mg/dL (ref 8.4–10.5)
Chloride: 100 mEq/L (ref 96–112)
Creatinine, Ser: 1.34 mg/dL (ref 0.40–1.50)
GFR: 68.64 mL/min (ref >60.00)
Glucose, Bld: 91 mg/dL (ref 70–99)
Potassium: 3.4 mEq/L — ABNORMAL LOW (ref 3.5–5.1)
Sodium: 138 mEq/L (ref 135–145)
Total Bilirubin: 0.3 mg/dL (ref 0.2–1.2)
Total Protein: 7.3 g/dL (ref 6.0–8.3)

## 2019-05-30 LAB — HEMOGLOBIN A1C: Hgb A1c MFr Bld: 6.3 % (ref 4.6–6.5)

## 2019-05-30 LAB — MAGNESIUM: Magnesium: 1.8 mg/dL (ref 1.5–2.5)

## 2019-05-31 ENCOUNTER — Encounter: Payer: Self-pay | Admitting: Neurology

## 2019-06-07 ENCOUNTER — Telehealth: Payer: Self-pay | Admitting: Family Medicine

## 2019-06-07 NOTE — Telephone Encounter (Signed)
Returned patients call to see if we could schedule and appointment. No answer LMTCB

## 2019-06-07 NOTE — Telephone Encounter (Signed)
Needs to rtc.  

## 2019-06-07 NOTE — Telephone Encounter (Signed)
Patient states his new medications, chlorthalidone and spironolactone, are making him dizzy. Does he need to change his dosage or go back to other medication he was on.

## 2019-06-09 ENCOUNTER — Ambulatory Visit (INDEPENDENT_AMBULATORY_CARE_PROVIDER_SITE_OTHER): Payer: BC Managed Care – PPO | Admitting: Family Medicine

## 2019-06-09 ENCOUNTER — Encounter: Payer: Self-pay | Admitting: Family Medicine

## 2019-06-09 ENCOUNTER — Other Ambulatory Visit: Payer: Self-pay

## 2019-06-09 VITALS — BP 134/90 | HR 75 | Temp 97.9°F | Ht 73.0 in | Wt 246.6 lb

## 2019-06-09 DIAGNOSIS — I4891 Unspecified atrial fibrillation: Secondary | ICD-10-CM

## 2019-06-09 DIAGNOSIS — I10 Essential (primary) hypertension: Secondary | ICD-10-CM

## 2019-06-09 MED ORDER — TRIAMTERENE-HCTZ 75-50 MG PO TABS: 1.0000 | ORAL_TABLET | Freq: Every day | ORAL | 1 refills | Status: DC

## 2019-06-09 MED ORDER — DILTIAZEM HCL ER COATED BEADS 300 MG PO CP24: 300.0000 mg | ORAL_CAPSULE | Freq: Every day | ORAL | 1 refills | Status: DC

## 2019-06-09 NOTE — Progress Notes (Signed)
Established Patient Office Visit  Subjective:  Patient ID: Nicholas Hughes, male    DOB: April 24, 1970  Age: 49 y.o. MRN: [id number]  CC:  Chief Complaint  Patient presents with  . Follow-up    pt states that BP medication seems to be making him feel dizzy    HPI [place] presents for follow-up of his hypertension.  He had developed some dizziness with the chlorthalidone and carvedilol.  He is recently started Cardizem and Maxide with better effect.  Blood pressures been running in the 130s over 80s.  He is having no problems with lightheadedness or dizziness.  Medicine seem to agree with him.  Had noted that he had told emergency room doctor at a recent visit that he had been nervous.  We discussed that today and he said that was really not the case as an ongoing issue for him.  He has also adjusted his lifestyle and diet.  He has incorporated more fruits and vegetables and lower salt foods that he consumes.  Past Medical History:  Diagnosis Date  . ED (erectile dysfunction)   . GERD (gastroesophageal reflux disease)   . Hypertension   . LVH (left ventricular hypertrophy)   . OSA (obstructive sleep apnea) 07/14/2014   Moderate OSA with AHI 20/hr  . Renal insufficiency   . Transient atrial fibrillation or flutter     Past Surgical History:  Procedure Laterality Date  . TRANSTHORACIC ECHOCARDIOGRAM  05/07/2009   EF 60-65%    Family History  Problem Relation Age of Onset  . Hypertension Father   . Coronary artery disease Father     Social History   Socioeconomic History  . Marital status: Single    Spouse name: Not on file  . Number of children: Not on file  . Years of education: Not on file  . Highest education level: Not on file  Occupational History  . Not on file  Tobacco Use  . Smoking status: Never Smoker  . Smokeless tobacco: Never Used  Substance and Sexual Activity  . Alcohol use: Not Currently    Comment: occasionally  . Drug use: No  . Sexual  activity: Yes  Other Topics Concern  . Not on file  Social History Narrative  . Not on file   Social Determinants of Health   Financial Resource Strain:   . Difficulty of Paying Living Expenses:   Food Insecurity:   . Worried About Charity fundraiser in the Last Year:   . Arboriculturist in the Last Year:   Transportation Needs:   . Film/video editor (Medical):   [name] Lack of Transportation (Non-Medical):   Physical Activity:   . Days of Exercise per Week:   . Minutes of Exercise per Session:   Stress:   . Feeling of Stress :   Social Connections:   . Frequency of Communication with Friends and Family:   . Frequency of Social Gatherings with Friends and Family:   . Attends Religious Services:   . Active Member of Clubs or Organizations:   . Attends Archivist Meetings:   [name] Marital Status:   Intimate Partner Violence:   . Fear of Current or Ex-Partner:   . Emotionally Abused:   [name] Physically Abused:   . Sexually Abused:     Outpatient Medications Prior to Visit  Medication Sig Dispense Refill  . diltiazem (CARTIA XT) 300 MG 24 hr capsule Take 1 capsule (300 mg total) by mouth daily. [place]  capsule 1  . triamterene-hydrochlorothiazide (MAXZIDE) 75-50 MG tablet Take 1 tablet by mouth daily.    . chlorthalidone (HYGROTON) 25 MG tablet Take 1 tablet (25 mg total) by mouth daily. (Patient not taking: Reported on 06/09/2019) 90 tablet 1  . spironolactone (ALDACTONE) 25 MG tablet Take 1 tablet (25 mg total) by mouth daily. (Patient not taking: Reported on 06/09/2019) 90 tablet 1  . sucralfate (CARAFATE) 1 g tablet Take 1 tablet (1 g total) by mouth 4 (four) times daily as needed. (Patient not taking: Reported on 05/29/2019) 30 tablet 0   No facility-administered medications prior to visit.    No Known Allergies  ROS Review of Systems  Constitutional: Negative.   HENT: Negative.   Eyes: Negative for photophobia and visual disturbance.  Respiratory: Negative.     Cardiovascular: Negative.   Gastrointestinal: Negative.   Genitourinary: Negative.   Musculoskeletal: Negative for gait problem and joint swelling.  Neurological: Negative for dizziness, light-headedness and headaches.  Psychiatric/Behavioral: Negative for dysphoric mood. The patient is not nervous/anxious.        Depression screen PHQ 2/9 02/14/2019  Decreased Interest 0  Down, Depressed, Hopeless 0  PHQ - 2 Score 0    Objective:    Physical Exam  Constitutional: He is oriented to person, place, and time. He appears well-developed and well-nourished. No distress.  HENT:  Head: Normocephalic and atraumatic.  Right Ear: External ear normal.  Left Ear: External ear normal.  Mouth/Throat: Oropharynx is clear and moist. No oropharyngeal exudate.  Eyes: Pupils are equal, round, and reactive to light. Conjunctivae are normal. Right eye exhibits no discharge. Left eye exhibits no discharge. No scleral icterus.  Neck: No JVD present. No tracheal deviation present. No thyromegaly present.  Cardiovascular: Normal rate, regular rhythm and normal heart sounds.  Pulmonary/Chest: Effort normal and breath sounds normal. No stridor.  Abdominal: Bowel sounds are normal.  Musculoskeletal:        General: No edema.  Lymphadenopathy:    He has no cervical adenopathy.  Neurological: He is alert and oriented to person, place, and time.  Skin: Skin is warm and dry. He is not diaphoretic.  Psychiatric: He has a normal mood and affect. His behavior is normal.    BP 134/90   Pulse 75   Temp 97.9 F (36.6 C) (Tympanic)   Ht 6\' 1"  (1.854 m)   Wt 246 lb 9.6 oz (111.9 kg)   SpO2 99%   BMI 32.53 kg/m  Wt Readings from Last 3 Encounters:  06/09/19 246 lb 9.6 oz (111.9 kg)  05/29/19 256 lb 6.4 oz (116.3 kg)  05/24/19 258 lb 1.6 oz (117.1 kg)     Health Maintenance Due  Topic Date Due  . HIV Screening  Never done  . COVID-19 Vaccine (1) Never done    There are no preventive care reminders  to display for this patient.  Lab Results  Component Value Date   TSH 0.77 02/14/2019   Lab Results  Component Value Date   WBC 6.2 02/04/2019   HGB 14.4 02/04/2019   HCT 44.3 02/04/2019   MCV 87.4 02/04/2019   PLT 248 02/04/2019   Lab Results  Component Value Date   NA 138 05/29/2019   K 3.4 (L) 05/29/2019   CO2 31 05/29/2019   GLUCOSE 91 05/29/2019   BUN 19 05/29/2019   CREATININE 1.34 05/29/2019   BILITOT 0.3 05/29/2019   ALKPHOS 51 05/29/2019   AST 23 05/29/2019   ALT 23 05/29/2019  PROT 7.3 05/29/2019   ALBUMIN 4.2 05/29/2019   CALCIUM 9.3 05/29/2019   ANIONGAP 8 02/04/2019   GFR 68.64 05/29/2019   Lab Results  Component Value Date   CHOL 158 03/07/2014   Lab Results  Component Value Date   HDL 45.80 03/07/2014   Lab Results  Component Value Date   LDLCALC 93 03/07/2014   Lab Results  Component Value Date   TRIG 94.0 03/07/2014   Lab Results  Component Value Date   CHOLHDL 3 03/07/2014   Lab Results  Component Value Date   HGBA1C 6.3 05/29/2019      Assessment & Plan:   Problem List Items Addressed This Visit      Cardiovascular and Mediastinum   Essential hypertension - Primary   Relevant Medications   triamterene-hydrochlorothiazide (MAXZIDE) 75-50 MG tablet   diltiazem (CARTIA XT) 300 MG 24 hr capsule   Atrial fibrillation, transient (HCC)   Relevant Medications   triamterene-hydrochlorothiazide (MAXZIDE) 75-50 MG tablet   diltiazem (CARTIA XT) 300 MG 24 hr capsule      Meds ordered this encounter  Medications  . triamterene-hydrochlorothiazide (MAXZIDE) 75-50 MG tablet    Sig: Take 1 tablet by mouth daily.    Dispense:  90 tablet    Refill:  1  . diltiazem (CARTIA XT) 300 MG 24 hr capsule    Sig: Take 1 capsule (300 mg total) by mouth daily.    Dispense:  90 capsule    Refill:  1    Follow-up: Return in about 3 months (around 09/09/2019).  Patient was given information on managing hypertension.  Her goal would be a blood  pressure for him in the 130s over 80s.  He will continue his healthy diet avoiding sodium.  Mliss Sax, MD

## 2019-06-09 NOTE — Patient Instructions (Addendum)
   Managing Your Hypertension Hypertension is commonly called high blood pressure. This is when the force of your blood pressing against the walls of your arteries is too strong. Arteries are blood vessels that carry blood from your heart throughout your body. Hypertension forces the heart to work harder to pump blood, and may cause the arteries to become narrow or stiff. Having untreated or uncontrolled hypertension can cause heart attack, stroke, kidney disease, and other problems. What are blood pressure readings? A blood pressure reading consists of a higher number over a lower number. Ideally, your blood pressure should be below 120/80. The first ("top") number is called the systolic pressure. It is a measure of the pressure in your arteries as your heart beats. The second ("bottom") number is called the diastolic pressure. It is a measure of the pressure in your arteries as the heart relaxes. What does my blood pressure reading mean? Blood pressure is classified into four stages. Based on your blood pressure reading, your health care provider may use the following stages to determine what type of treatment you need, if any. Systolic pressure and diastolic pressure are measured in a unit called mm Hg. Normal  Systolic pressure: below 120.  Diastolic pressure: below 80. Elevated  Systolic pressure: 120-129.  Diastolic pressure: below 80. Hypertension stage 1  Systolic pressure: 130-139.  Diastolic pressure: 80-89. Hypertension stage 2  Systolic pressure: 140 or above.  Diastolic pressure: 90 or above. What health risks are associated with hypertension? Managing your hypertension is an important responsibility. Uncontrolled hypertension can lead to:  A heart attack.  A stroke.  A weakened blood vessel (aneurysm).  Heart failure.  Kidney damage.  Eye damage.  Metabolic syndrome.  Memory and concentration problems. What changes can I make to manage my  hypertension? Hypertension can be managed by making lifestyle changes and possibly by taking medicines. Your health care provider will help you make a plan to bring your blood pressure within a normal range. Eating and drinking   Eat a diet that is high in fiber and potassium, and low in salt (sodium), added sugar, and fat. An example eating plan is called the DASH (Dietary Approaches to Stop Hypertension) diet. To eat this way: ? Eat plenty of fresh fruits and vegetables. Try to fill half of your plate at each meal with fruits and vegetables. ? Eat whole grains, such as whole wheat pasta, brown rice, or whole grain bread. Fill about one quarter of your plate with whole grains. ? Eat low-fat diary products. ? Avoid fatty cuts of meat, processed or cured meats, and poultry with skin. Fill about one quarter of your plate with lean proteins such as fish, chicken without skin, beans, eggs, and tofu. ? Avoid premade and processed foods. These tend to be higher in sodium, added sugar, and fat.  Reduce your daily sodium intake. Most people with hypertension should eat less than 1,500 mg of sodium a day.  Limit alcohol intake to no more than 1 drink a day for nonpregnant women and 2 drinks a day for men. One drink equals 12 oz of beer, 5 oz of wine, or 1 oz of hard liquor. Lifestyle  Work with your health care provider to maintain a healthy body weight, or to lose weight. Ask what an ideal weight is for you.  Get at least 30 minutes of exercise that causes your heart to beat faster (aerobic exercise) most days of the week. Activities may include walking, swimming, or biking.    Include exercise to strengthen your muscles (resistance exercise), such as weight lifting, as part of your weekly exercise routine. Try to do these types of exercises for 30 minutes at least 3 days a week.  Do not use any products that contain nicotine or tobacco, such as cigarettes and e-cigarettes. If you need help quitting,  ask your health care provider.  Control any long-term (chronic) conditions you have, such as high cholesterol or diabetes. Monitoring  Monitor your blood pressure at home as told by your health care provider. Your personal target blood pressure may vary depending on your medical conditions, your age, and other factors.  Have your blood pressure checked regularly, as often as told by your health care provider. Working with your health care provider  Review all the medicines you take with your health care provider because there may be side effects or interactions.  Talk with your health care provider about your diet, exercise habits, and other lifestyle factors that may be contributing to hypertension.  Visit your health care provider regularly. Your health care provider can help you create and adjust your plan for managing hypertension. Will I need medicine to control my blood pressure? Your health care provider may prescribe medicine if lifestyle changes are not enough to get your blood pressure under control, and if:  Your systolic blood pressure is 130 or higher.  Your diastolic blood pressure is 80 or higher. Take medicines only as told by your health care provider. Follow the directions carefully. Blood pressure medicines must be taken as prescribed. The medicine does not work as well when you skip doses. Skipping doses also puts you at risk for problems. Contact a health care provider if:  You think you are having a reaction to medicines you have taken.  You have repeated (recurrent) headaches.  You feel dizzy.  You have swelling in your ankles.  You have trouble with your vision. Get help right away if:  You develop a severe headache or confusion.  You have unusual weakness or numbness, or you feel faint.  You have severe pain in your chest or abdomen.  You vomit repeatedly.  You have trouble breathing. Summary  Hypertension is when the force of blood pumping  through your arteries is too strong. If this condition is not controlled, it may put you at risk for serious complications.  Your personal target blood pressure may vary depending on your medical conditions, your age, and other factors. For most people, a normal blood pressure is less than 120/80.  Hypertension is managed by lifestyle changes, medicines, or both. Lifestyle changes include weight loss, eating a healthy, low-sodium diet, exercising more, and limiting alcohol. This information is not intended to replace advice given to you by your health care provider. Make sure you discuss any questions you have with your health care provider. Document Revised: 05/06/2018 Document Reviewed: 12/11/2015 Elsevier Patient Education  2020 Elsevier Inc.  

## 2019-06-12 NOTE — Telephone Encounter (Signed)
Patient seen in office 06/09/19

## 2019-09-11 ENCOUNTER — Encounter: Payer: Self-pay | Admitting: Neurology

## 2019-09-11 ENCOUNTER — Ambulatory Visit (INDEPENDENT_AMBULATORY_CARE_PROVIDER_SITE_OTHER): Payer: BC Managed Care – PPO | Admitting: Neurology

## 2019-09-11 ENCOUNTER — Other Ambulatory Visit: Payer: Self-pay

## 2019-09-11 VITALS — BP 160/110 | HR 82 | Resp 18 | Ht 72.0 in | Wt 257.0 lb

## 2019-09-11 DIAGNOSIS — R253 Fasciculation: Secondary | ICD-10-CM

## 2019-09-11 DIAGNOSIS — R251 Tremor, unspecified: Secondary | ICD-10-CM

## 2019-09-11 NOTE — Patient Instructions (Addendum)
Nerve testing of both arms  Follow-up with your primary care doctor to check blood pressure

## 2019-09-11 NOTE — Progress Notes (Signed)
[facility] Clinic Note - Initial Visit   Date: 09/11/19  Nicholas Hughes MRN: [id number] DOB: Mar 23, 1970   Dear Dr. Doreene Burke:  Thank you for your kind referral of [facility] for consultation of muscle twitches. Although his history is well known to you, please allow [place] to reiterate it for the purpose of our medical record. The patient was accompanied to the clinic by self.    History of Present Illness: Nicholas Hughes is a 49 y.o. right-handed male with hypertension presenting for evaluation of muscle twitches. Starting around spring 2021, he began having intermittent muscle twitches over the right side of the back and hands. No associated pain, weakness, difficulty swallowing/talking, or shortness of breath.  His movements in the back are intermittent, but in the hands tends to much more constant.  He notices it more at rest, such as when driving.  Nothing which triggers or alleviates symptoms.  TSH is normal.  He has not started any new medications.  His blood pressure is very elevated today 180/137, rechecked 160/110.  He denies chest pain, shortness of breath, or weakness.  He monitors his BP at home which has been elevated in the past, but recently stay around 130-150s.     Out-side paper records, electronic medical record, and images have been reviewed where available and summarized as:  Lab Results  Component Value Date   HGBA1C 6.3 05/29/2019    Lab Results  Component Value Date   TSH 0.77 02/14/2019    Past Medical History:  Diagnosis Date  . ED (erectile dysfunction)   . GERD (gastroesophageal reflux disease)   . Hypertension   . LVH (left ventricular hypertrophy)   . OSA (obstructive sleep apnea) 07/14/2014   Moderate OSA with AHI 20/hr  . Renal insufficiency   . Transient atrial fibrillation or flutter     Past Surgical History:  Procedure Laterality Date  . TRANSTHORACIC ECHOCARDIOGRAM  05/07/2009   EF 60-65%      Medications:  Outpatient Encounter Medications as of 09/11/2019  Medication Sig  . diltiazem (CARTIA XT) 300 MG 24 hr capsule Take 1 capsule (300 mg total) by mouth daily.  [name] triamterene-hydrochlorothiazide (MAXZIDE) 75-50 MG tablet Take 1 tablet by mouth daily.  . [DISCONTINUED] carvedilol (COREG) 6.25 MG tablet Take 1 tablet (6.25 mg total) by mouth 2 (two) times daily. (Patient not taking: Reported on 02/14/2019)   No facility-administered encounter medications on file as of 09/11/2019.    Allergies: No Known Allergies  Family History: Family History  Problem Relation Age of Onset  . Hypertension Father   . Coronary artery disease Father     Social History: Social History   Tobacco Use  . Smoking status: Never Smoker  . Smokeless tobacco: Never Used  Vaping Use  . Vaping Use: Never used  Substance Use Topics  . Alcohol use: Not Currently    Comment: occasionally  . Drug use: No   Social History   Social History Narrative   Right handed   One story home   Drinks no caffeine    Vital Signs:  BP (!) 180/137   Pulse 82   Resp 18   Ht 6' (1.829 m)   Wt 257 lb (116.6 kg)   SpO2 96%   BMI 34.86 kg/m     Neurological Exam: MENTAL STATUS including orientation to time, place, person, recent and remote memory, attention span and concentration, language, and fund of knowledge is normal.  Speech is not dysarthric.  CRANIAL  NERVES: II:  No visual field defects.  III-IV-VI: Pupils equal round and reactive to light.  Normal conjugate, extra-ocular eye movements in all directions of gaze.  No nystagmus.  No ptosis.   V:  Normal facial sensation.    VII:  Normal facial symmetry and movements.   VIII:  Normal hearing and vestibular function.   IX-X:  Normal palatal movement.   XI:  Normal shoulder shrug and head rotation.   XII:  Normal tongue strength and range of motion, no deviation or fasciculation.  MOTOR:  Very fine tremor of the right index finger is  observed, as well as rhythmic muscle contraction of the FDI.  No atrophy or fasciculations.  No pronator drift.   Upper Extremity:  Right  Left  Deltoid  5/5   5/5   Biceps  5/5   5/5   Triceps  5/5   5/5   Infraspinatus 5/5  5/5  Medial pectoralis 5/5  5/5  Wrist extensors  5/5   5/5   Wrist flexors  5/5   5/5   Finger extensors  5/5   5/5   Finger flexors  5/5   5/5   Dorsal interossei  5/5   5/5   Abductor pollicis  5/5   5/5   Tone (Ashworth scale)  0  0   Lower Extremity:  Right  Left  Hip flexors  5/5   5/5   Hip extensors  5/5   5/5   Adductor 5/5  5/5  Abductor 5/5  5/5  Knee flexors  5/5   5/5   Knee extensors  5/5   5/5   Dorsiflexors  5/5   5/5   Plantarflexors  5/5   5/5   Toe extensors  5/5   5/5   Toe flexors  5/5   5/5   Tone (Ashworth scale)  0  0   MSRs:  Right        Left                  brachioradialis 2+  2+  biceps 2+  2+  triceps 2+  2+  patellar 2+  2+  ankle jerk 2+  2+  Hoffman no  no  plantar response down  down   SENSORY:  Normal and symmetric perception of light touch, pinprick, vibration, and proprioception.  Romberg's sign absent.   COORDINATION/GAIT: Normal finger-to- nose-finger and heel-to-shin.  Intact rapid alternating movements bilaterally.  Able to rise from a chair without using arms.  Gait narrow based and stable. Tandem and stressed gait intact.    IMPRESSION: 1.  Benign tremor.  Movements are not consistent with fasciculations as they are regular and rhythmic and seem to be associated with fine finger tremor.  To be complete, he will return for NCS/EMG of the arms  2. Elevated blood pressure, asymptomatic.  BP 180/137 and rechecked 160/110.  He was advised to follow-up with PCP and monitor at home.  If he develops shortness of breath, chest pain, or dizziness, go to the ER.  Further recommendations pending results.    Thank you for allowing me to participate in patient's care.  If I can answer any additional questions, I  would be pleased to do so.    Sincerely,    Alexandre Lightsey K. Allena Katz, DO

## 2019-10-11 ENCOUNTER — Other Ambulatory Visit: Payer: Self-pay

## 2019-10-11 ENCOUNTER — Ambulatory Visit (INDEPENDENT_AMBULATORY_CARE_PROVIDER_SITE_OTHER): Payer: BC Managed Care – PPO | Admitting: Neurology

## 2019-10-11 DIAGNOSIS — R253 Fasciculation: Secondary | ICD-10-CM | POA: Diagnosis not present

## 2019-10-11 DIAGNOSIS — G5603 Carpal tunnel syndrome, bilateral upper limbs: Secondary | ICD-10-CM

## 2019-10-11 DIAGNOSIS — R251 Tremor, unspecified: Secondary | ICD-10-CM

## 2019-10-11 NOTE — Procedures (Signed)
[facility] Neu[facility]  [address] [place], [address]  [place], [place] [address] Tel: [phone] Fax:  [phone] Test Date:  10/11/2019  Patient: Nicholas Hughes DOB: 11-Aug-1970 Physician: Nita Sickle, DO  Sex: Male Height: 6' " Ref Phys: Nita Sickle, DO  ID#: [id number] Temp: 33.8C Technician:    Patient Complaints: This is a 49 year old man referred for evaluation of abnormal hand movements.  NCV & EMG Findings: Extensive electrodiagnostic testing of the right upper extremity and additional studies of the left shows:  1. Bilateral median sensory responses show prolonged distal peak latency (L4.0, R3.9 ms) and reduced amplitude (L15.3, R10.6 V).  Bilateral ulnar sensory responses are within normal limits. 2. Bilateral median motor responses show prolonged latency (L4.0, R4.2 ms).  Bilateral ulnar motor responses are within normal limits.   3. There is no evidence of active or chronic motor axonal loss changes affecting any of the tested muscles.  Motor unit configuration and recruitment pattern is within normal limits.     Impression: Bilateral median neuropathy at or distal to the wrist, consistent with a clinical diagnosis of carpal tunnel syndrome.  Overall, these findings are moderate in degree electrically.   ___________________________ Nita Sickle, DO    Nerve Conduction Studies Anti Sensory Summary Table   Stim Site NR Peak (ms) Norm Peak (ms) P-T Amp (V) Norm P-T Amp  Left Median Anti Sensory (2nd Digit)  33.8C  Wrist    4.0 <3.4 15.3 >20  Right Median Anti Sensory (2nd Digit)  33.8C  Wrist    3.9 <3.4 10.6 >20  Left Ulnar Anti Sensory (5th Digit)  33.8C  Wrist    3.1 <3.1 19.7 >12  Right Ulnar Anti Sensory (5th Digit)  33.8C  Wrist    2.9 <3.1 18.9 >12   Motor Summary Table   Stim Site NR Onset (ms) Norm Onset (ms) O-P Amp (mV) Norm O-P Amp Site1 Site2 Delta-0 (ms) Dist (cm) Vel (m/s) Norm Vel (m/s)  Left Median Motor (Abd Poll Brev)  33.8C  Wrist     4.0 <3.9 11.7 >6 Elbow Wrist 5.4 34.0 63 >50  Elbow    9.4  10.8         Right Median Motor (Abd Poll Brev)  33.8C  Wrist    4.2 <3.9 11.8 >6 Elbow Wrist 5.6 33.0 59 >50  Elbow    9.8  11.3         Left Ulnar Motor (Abd Dig Minimi)  33.8C  Wrist    3.0 <3.1 9.1 >7 B Elbow Wrist 4.5 26.0 58 >50  B Elbow    7.5  8.8  A Elbow B Elbow 1.6 10.0 63 >50  A Elbow    9.1  8.4         Right Ulnar Motor (Abd Dig Minimi)  33.8C  Wrist    3.0 <3.1 10.8 >7 B Elbow Wrist 4.3 27.0 63 >50  B Elbow    7.3  9.4  A Elbow B Elbow 1.9 10.0 53 >50  A Elbow    9.2  9.0          EMG   Side Muscle Ins Act Fibs Psw Fasc Number Recrt Dur Dur. Amp Amp. Poly Poly. Comment  Right 1stDorInt Nml Nml Nml Nml Nml Nml Nml Nml Nml Nml Nml Nml N/A  Right Abd Poll Brev Nml Nml Nml Nml Nml Nml Nml Nml Nml Nml Nml Nml N/A  Right PronatorTeres Nml Nml Nml Nml Nml Nml Nml Nml  Nml Nml Nml Nml N/A  Right Biceps Nml Nml Nml Nml Nml Nml Nml Nml Nml Nml Nml Nml N/A  Right Triceps Nml Nml Nml Nml Nml Nml Nml Nml Nml Nml Nml Nml N/A  Right Deltoid Nml Nml Nml Nml Nml Nml Nml Nml Nml Nml Nml Nml N/A  Left 1stDorInt Nml Nml Nml Nml Nml Nml Nml Nml Nml Nml Nml Nml N/A  Left Abd Poll Brev Nml Nml Nml Nml Nml Nml Nml Nml Nml Nml Nml Nml N/A  Left PronatorTeres Nml Nml Nml Nml Nml Nml Nml Nml Nml Nml Nml Nml N/A  Left Biceps Nml Nml Nml Nml Nml Nml Nml Nml Nml Nml Nml Nml N/A  Left Triceps Nml Nml Nml Nml Nml Nml Nml Nml Nml Nml Nml Nml N/A  Left Deltoid Nml Nml Nml Nml Nml Nml Nml Nml Nml Nml Nml Nml N/A      Waveforms:

## 2019-12-19 ENCOUNTER — Other Ambulatory Visit: Payer: Self-pay

## 2019-12-19 ENCOUNTER — Encounter: Payer: Self-pay | Admitting: Cardiovascular Disease

## 2019-12-19 ENCOUNTER — Ambulatory Visit (INDEPENDENT_AMBULATORY_CARE_PROVIDER_SITE_OTHER): Payer: BC Managed Care – PPO | Admitting: Cardiovascular Disease

## 2019-12-19 VITALS — BP 134/90 | HR 57 | Ht 72.0 in | Wt 255.8 lb

## 2019-12-19 DIAGNOSIS — I10 Essential (primary) hypertension: Secondary | ICD-10-CM | POA: Diagnosis not present

## 2019-12-19 DIAGNOSIS — I517 Cardiomegaly: Secondary | ICD-10-CM

## 2019-12-19 DIAGNOSIS — I4891 Unspecified atrial fibrillation: Secondary | ICD-10-CM

## 2019-12-19 MED ORDER — DILTIAZEM HCL ER COATED BEADS 300 MG PO CP24: 300.0000 mg | ORAL_CAPSULE | Freq: Every day | ORAL | 3 refills | Status: DC

## 2019-12-19 MED ORDER — TRIAMTERENE-HCTZ 75-50 MG PO TABS: 1.0000 | ORAL_TABLET | Freq: Every day | ORAL | 3 refills | Status: DC

## 2019-12-19 NOTE — Patient Instructions (Signed)
Medication Instructions:  Your provider recommends that you continue on your current medications as directed. Please refer to the Current Medication list given to you today.   *If you need a refill on your cardiac medications before your next appointment, please call your pharmacy*  Follow-Up: At [facility], you and your health needs are our priority.  As part of our continuing mission to provide you with exceptional heart care, we have created designated Provider Care Teams.  These Care Teams include your primary Cardiologist (physician) and Advanced Practice Providers (APPs -  Physician Assistants and Nurse Practitioners) who all work together to provide you with the care you need, when you need it. Your next appointment:   12 month(s) The format for your next appointment:   In Person Provider:   You may see Philip Nahser, MD or one of the following Advanced Practice Providers on your designated Care Team:    Scott Weaver, PA-C  Vin Bhagat, PA-C   

## 2019-12-19 NOTE — Progress Notes (Signed)
Cardiology Office Note   Date:  12/19/2019   ID:  Nicholas Hughes, DOB 11-21-1970, MRN [id number]  PCP:  Mliss Sax, MD  Cardiologist:   Kristeen Miss, MD   Chief Complaint  Patient presents with   Hypertension   Atrial Fibrillation   Problem list: 1. Hypertension 2. Left ventricular Hypertrophy 3. Transient atrial fibrillation    Nicholas Hughes is a 49 y.o. male who presents for follow up of his HTN. He has not been having any CP or dyspnea. Eating better , does eat chicken wings every other Wednesday.   We performed an echo card gram back in 2011 which showed normal left ventricular systolic function. Mild mitral regurgitation.  No palpitations. Works for [employer] ( now has changed name to [employer] )   July 27, 2014:   Doing well . Reduced sex drive  Sept. 14, 0263: Doing well BP is a bit high. He's been working out on a regular basis. He still eats some extra salt ([place] bacon)   April 03, 2015: Still working at [employer] and also works in the [employer].   Has been overeating recently .    Has lost 10 labs since last year.  Has cut out his salt.   Eating lots of fruits.   Drinking protein shakes ( as a snack, not as a meal )  Is still trying to get his CPAP machine   March 27, 2016:  Doing well  BP has been well controlled  Eating better. Exercising well.    June 30, 2016:  Doing well Work is going well ,  BP has been well controlled Does not have a PCP   November 01, 2017: Doing well. Has not been to sleep yet today - working 2 jobs - his main job is moving to [place].  BP is a bit elevated today  Not working out as much . BP has been ok   Nov. 23, 2021 [facility] is seen today for follow up.  Hx of HTN. OSA ,  and PAF  Wants a referal to GI  No cp , no dyspnea.  Has started back exercising  Works at [employer].  Has tried to limit his carbs  Has been working with a Systems analyst.   Past  Medical History:  Diagnosis Date   ED (erectile dysfunction)    GERD (gastroesophageal reflux disease)    Hypertension    LVH (left ventricular hypertrophy)    OSA (obstructive sleep apnea) 07/14/2014   Moderate OSA with AHI 20/hr   Renal insufficiency    Transient atrial fibrillation or flutter     Past Surgical History:  Procedure Laterality Date   TRANSTHORACIC ECHOCARDIOGRAM  05/07/2009   EF 60-65%     Current Outpatient Medications  Medication Sig Dispense Refill   diltiazem (CARTIA XT) 300 MG 24 hr capsule Take 1 capsule (300 mg total) by mouth daily. 90 capsule 1   triamterene-hydrochlorothiazide (MAXZIDE) 75-50 MG tablet Take 1 tablet by mouth daily. 90 tablet 1   No current facility-administered medications for this visit.    Allergies:   Patient has no known allergies.    Social History:  The patient  reports that he has never smoked. He has never used smokeless tobacco. He reports previous alcohol use. He reports that he does not use drugs.   Family History:  The patient's family history includes Coronary artery disease in his father; Hypertension in his father.  ROS:  Please see the history of present illness.    Physical Exam: Blood pressure 134/90, pulse (!) 57, height 6' (1.829 m), weight 255 lb 12.8 oz (116 kg), SpO2 98 %.  GEN:  Middle age male,  NAD  HEENT: Normal NECK: No JVD; No carotid bruits LYMPHATICS: No lymphadenopathy CARDIAC: RRR , no murmurs, rubs, gallops RESPIRATORY:  Clear to auscultation without rales, wheezing or rhonchi  ABDOMEN: Soft, non-tender, non-distended MUSCULOSKELETAL:  No edema; No deformity  SKIN: Warm and dry NEUROLOGIC:  Alert and oriented x 3    EKG:   Nov. 23, 2021:   Sinus brady at 57.   TWI diffusely  No changes. From previous     Recent Labs: 02/04/2019: Hemoglobin 14.4; Platelets 248 02/14/2019: TSH 0.77 05/29/2019: ALT 23; BUN 19; Creatinine, Ser 1.34; Magnesium 1.8; Potassium 3.4; Sodium 138     Lipid Panel    Component Value Date/Time   CHOL 158 03/07/2014 0923   TRIG 94.0 03/07/2014 0923   HDL 45.80 03/07/2014 0923   CHOLHDL 3 03/07/2014 0923   VLDL 18.8 03/07/2014 0923   LDLCALC 93 03/07/2014 0923      Wt Readings from Last 3 Encounters:  12/19/19 255 lb 12.8 oz (116 kg)  09/11/19 257 lb (116.6 kg)  06/09/19 246 lb 9.6 oz (111.9 kg)      Other studies Reviewed: Additional studies/ records that were reviewed today include: . Review of the above records demonstrates:    ASSESSMENT AND PLAN:  1. Hypertension-      Cont meds.   Advised him to work on weight loss     2. Left ventricular Hypertrophy -  asymptomtic    3. Transient atrial fibrillation-   In sinus rhythm,  Cont current meds.   CHADS2VASC is 1.  No recurrent Afib  Not on anticoagulation at this point     4.   Suspected sleep apnea:        Current medicines are reviewed at length with the patient today.  The patient does not have concerns regarding medicines.  The following changes have been made:  Increase Maxzide   Disposition:   FU with me in  1 year    Kristeen Miss, MD  12/19/2019 9:49 AM    [facility] [address] [place], [place], [place]  [address] Phone: [phone]; Fax: [phone]

## 2020-02-06 ENCOUNTER — Encounter: Payer: Self-pay | Admitting: Family

## 2020-02-06 ENCOUNTER — Other Ambulatory Visit: Payer: Self-pay

## 2020-02-06 ENCOUNTER — Ambulatory Visit (INDEPENDENT_AMBULATORY_CARE_PROVIDER_SITE_OTHER): Payer: BC Managed Care – PPO | Admitting: Family

## 2020-02-06 VITALS — BP 158/98 | HR 71 | Temp 98.4°F | Ht 72.0 in | Wt 258.8 lb

## 2020-02-06 DIAGNOSIS — I10 Essential (primary) hypertension: Secondary | ICD-10-CM | POA: Diagnosis not present

## 2020-02-06 DIAGNOSIS — R030 Elevated blood-pressure reading, without diagnosis of hypertension: Secondary | ICD-10-CM | POA: Diagnosis not present

## 2020-02-06 MED ORDER — METOPROLOL SUCCINATE ER 25 MG PO TB24
25.0000 mg | ORAL_TABLET | Freq: Every day | ORAL | 3 refills | Status: DC
Start: 2020-02-06 — End: 2020-02-20

## 2020-02-06 NOTE — Progress Notes (Signed)
Acute Office Visit  Subjective:    Patient ID: Nicholas Hughes, male    DOB: 1970/07/31, 50 y.o.   MRN: [id number]  Chief Complaint  Patient presents with  . Hypertension    Elevated BP x 3 weeks medication does not seem to be helping after several months. Some headaches that come and go.     HPI Patient is in today with concerns of elevated blood pressure x 3 weeks. Reports exercising routinely, eating well. He believes he has headaches as a result of the blood pressure elevations. Blood pressure 160's systolic outside of the office.   Past Medical History:  Diagnosis Date  . ED (erectile dysfunction)   . GERD (gastroesophageal reflux disease)   . Hypertension   . LVH (left ventricular hypertrophy)   . OSA (obstructive sleep apnea) 07/14/2014   Moderate OSA with AHI 20/hr  . Renal insufficiency   . Transient atrial fibrillation or flutter     Past Surgical History:  Procedure Laterality Date  . TRANSTHORACIC ECHOCARDIOGRAM  05/07/2009   EF 60-65%    Family History  Problem Relation Age of Onset  . Hypertension Father   . Coronary artery disease Father     Social History   Socioeconomic History  . Marital status: Single    Spouse name: Not on file  . Number of children: Not on file  . Years of education: Not on file  . Highest education level: Not on file  Occupational History  . Not on file  Tobacco Use  . Smoking status: Never Smoker  . Smokeless tobacco: Never Used  Vaping Use  . Vaping Use: Never used  Substance and Sexual Activity  . Alcohol use: Not Currently    Comment: occasionally  . Drug use: No  . Sexual activity: Yes  Other Topics Concern  . Not on file  Social History Narrative   Right handed   One story home   Drinks no caffeine   Social Determinants of Health   Financial Resource Strain: Not on file  Food Insecurity: Not on file  Transportation Needs: Not on file  Physical Activity: Not on file  Stress: Not on file  Social  Connections: Not on file  Intimate Partner Violence: Not on file    Outpatient Medications Prior to Visit  Medication Sig Dispense Refill  . diltiazem (CARTIA XT) 300 MG 24 hr capsule Take 1 capsule (300 mg total) by mouth daily. 90 capsule 3  . triamterene-hydrochlorothiazide (MAXZIDE) 75-50 MG tablet Take 1 tablet by mouth daily. 90 tablet 3   No facility-administered medications prior to visit.    No Known Allergies  Review of Systems  Constitutional: Negative.   Respiratory: Negative.   Cardiovascular: Negative.   Musculoskeletal: Negative.   Skin: Negative.   Allergic/Immunologic: Negative.   Neurological: Positive for headaches. Negative for dizziness and numbness.  Psychiatric/Behavioral: Negative.   All other systems reviewed and are negative.      Objective:    Physical Exam Constitutional:      Appearance: Normal appearance.  Cardiovascular:     Rate and Rhythm: Normal rate and regular rhythm.  Pulmonary:     Effort: Pulmonary effort is normal.     Breath sounds: Normal breath sounds.  Abdominal:     General: Abdomen is flat. Bowel sounds are normal.     Palpations: Abdomen is soft.  Musculoskeletal:        General: Normal range of motion.     Cervical back: Normal range  of motion and neck supple.  Skin:    General: Skin is warm and dry.  Neurological:     General: No focal deficit present.     Mental Status: He is alert and oriented to person, place, and time.  Psychiatric:        Mood and Affect: Mood normal.     BP (!) 158/98   Pulse 71   Temp 98.4 F (36.9 C) (Temporal)   Ht 6' (1.829 m)   Wt 258 lb 12.8 oz (117.4 kg)   SpO2 97%   BMI 35.10 kg/m  Wt Readings from Last 3 Encounters:  02/06/20 258 lb 12.8 oz (117.4 kg)  12/19/19 255 lb 12.8 oz (116 kg)  09/11/19 257 lb (116.6 kg)    Health Maintenance Due  Topic Date Due  . Hepatitis C Screening  Never done  . HIV Screening  Never done  . COLONOSCOPY (Pts 45-37yrs Insurance coverage  will need to be confirmed)  Never done    There are no preventive care reminders to display for this patient.   Lab Results  Component Value Date   TSH 0.77 02/14/2019   Lab Results  Component Value Date   WBC 6.2 02/04/2019   HGB 14.4 02/04/2019   HCT 44.3 02/04/2019   MCV 87.4 02/04/2019   PLT 248 02/04/2019   Lab Results  Component Value Date   NA 138 05/29/2019   K 3.4 (L) 05/29/2019   CO2 31 05/29/2019   GLUCOSE 91 05/29/2019   BUN 19 05/29/2019   CREATININE 1.34 05/29/2019   BILITOT 0.3 05/29/2019   ALKPHOS 51 05/29/2019   AST 23 05/29/2019   ALT 23 05/29/2019   PROT 7.3 05/29/2019   ALBUMIN 4.2 05/29/2019   CALCIUM 9.3 05/29/2019   ANIONGAP 8 02/04/2019   GFR 68.64 05/29/2019   Lab Results  Component Value Date   CHOL 158 03/07/2014   Lab Results  Component Value Date   HDL 45.80 03/07/2014   Lab Results  Component Value Date   LDLCALC 93 03/07/2014   Lab Results  Component Value Date   TRIG 94.0 03/07/2014   Lab Results  Component Value Date   CHOLHDL 3 03/07/2014   Lab Results  Component Value Date   HGBA1C 6.3 05/29/2019       Assessment & Plan:   Problem List Items Addressed This Visit    Essential hypertension - Primary   Relevant Medications   metoprolol succinate (TOPROL-XL) 25 MG 24 hr tablet    Other Visit Diagnoses    Elevated blood pressure reading           Meds ordered this encounter  Medications  . metoprolol succinate (TOPROL-XL) 25 MG 24 hr tablet    Sig: Take 1 tablet (25 mg total) by mouth daily.    Dispense:  90 tablet    Refill:  3    Recheck in 3 weeks. Call with any questions or concerns.  Eulis Foster, FNP

## 2020-02-06 NOTE — Patient Instructions (Signed)

## 2020-02-09 DIAGNOSIS — Z20822 Contact with and (suspected) exposure to covid-19: Secondary | ICD-10-CM | POA: Diagnosis not present

## 2020-02-20 ENCOUNTER — Encounter: Payer: Self-pay | Admitting: Family

## 2020-02-20 ENCOUNTER — Ambulatory Visit (INDEPENDENT_AMBULATORY_CARE_PROVIDER_SITE_OTHER): Payer: BC Managed Care – PPO | Admitting: Family

## 2020-02-20 ENCOUNTER — Other Ambulatory Visit: Payer: Self-pay

## 2020-02-20 VITALS — BP 148/92 | HR 81 | Temp 97.7°F | Ht 72.0 in | Wt 257.8 lb

## 2020-02-20 DIAGNOSIS — I10 Essential (primary) hypertension: Secondary | ICD-10-CM

## 2020-02-20 MED ORDER — METOPROLOL SUCCINATE ER 50 MG PO TB24: 50.0000 mg | ORAL_TABLET | Freq: Every day | ORAL | 3 refills | Status: DC

## 2020-02-20 NOTE — Patient Instructions (Signed)
Low-Sodium Eating Plan Sodium, which is an element that makes up salt, helps you maintain a healthy balance of fluids in your body. Too much sodium can increase your blood pressure and cause fluid and waste to be held in your body. Your health care provider or dietitian may recommend following this plan if you have high blood pressure (hypertension), kidney disease, liver disease, or heart failure. Eating less sodium can help lower your blood pressure, reduce swelling, and protect your heart, liver, and kidneys. What are tips for following this plan? Reading food labels  The Nutrition Facts label lists the amount of sodium in one serving of the food. If you eat more than one serving, you must multiply the listed amount of sodium by the number of servings.  Choose foods with less than 140 mg of sodium per serving.  Avoid foods with 300 mg of sodium or more per serving. Shopping  Look for lower-sodium products, often labeled as "low-sodium" or "no salt added."  Always check the sodium content, even if foods are labeled as "unsalted" or "no salt added."  Buy fresh foods. ? Avoid canned foods and pre-made or frozen meals. ? Avoid canned, cured, or processed meats.  Buy breads that have less than 80 mg of sodium per slice.   Cooking  Eat more home-cooked food and less restaurant, buffet, and fast food.  Avoid adding salt when cooking. Use salt-free seasonings or herbs instead of table salt or sea salt. Check with your health care provider or pharmacist before using salt substitutes.  Cook with plant-based oils, such as canola, sunflower, or olive oil.   Meal planning  When eating at a restaurant, ask that your food be prepared with less salt or no salt, if possible. Avoid dishes labeled as brined, pickled, cured, smoked, or made with soy sauce, miso, or teriyaki sauce.  Avoid foods that contain MSG (monosodium glutamate). MSG is sometimes added to Chinese food, bouillon, and some canned  foods.  Make meals that can be grilled, baked, poached, roasted, or steamed. These are generally made with less sodium. General information Most people on this plan should limit their sodium intake to 1,500-2,000 mg (milligrams) of sodium each day. What foods should I eat? Fruits Fresh, frozen, or canned fruit. Fruit juice. Vegetables Fresh or frozen vegetables. "No salt added" canned vegetables. "No salt added" tomato sauce and paste. Low-sodium or reduced-sodium tomato and vegetable juice. Grains Low-sodium cereals, including oats, puffed wheat and rice, and shredded wheat. Low-sodium crackers. Unsalted rice. Unsalted pasta. Low-sodium bread. Whole-grain breads and whole-grain pasta. Meats and other proteins Fresh or frozen (no salt added) meat, poultry, seafood, and fish. Low-sodium canned tuna and salmon. Unsalted nuts. Dried peas, beans, and lentils without added salt. Unsalted canned beans. Eggs. Unsalted nut butters. Dairy Milk. Soy milk. Cheese that is naturally low in sodium, such as ricotta cheese, fresh mozzarella, or Swiss cheese. Low-sodium or reduced-sodium cheese. Cream cheese. Yogurt. Seasonings and condiments Fresh and dried herbs and spices. Salt-free seasonings. Low-sodium mustard and ketchup. Sodium-free salad dressing. Sodium-free light mayonnaise. Fresh or refrigerated horseradish. Lemon juice. Vinegar. Other foods Homemade, reduced-sodium, or low-sodium soups. Unsalted popcorn and pretzels. Low-salt or salt-free chips. The items listed above may not be a complete list of foods and beverages you can eat. Contact a dietitian for more information. What foods should I avoid? Vegetables Sauerkraut, pickled vegetables, and relishes. Olives. French fries. Onion rings. Regular canned vegetables (not low-sodium or reduced-sodium). Regular canned tomato sauce and paste (not low-sodium   or reduced-sodium). Regular tomato and vegetable juice (not low-sodium or reduced-sodium). Frozen  vegetables in sauces. Grains Instant hot cereals. Bread stuffing, pancake, and biscuit mixes. Croutons. Seasoned rice or pasta mixes. Noodle soup cups. Boxed or frozen macaroni and cheese. Regular salted crackers. Self-rising flour. Meats and other proteins Meat or fish that is salted, canned, smoked, spiced, or pickled. Precooked or cured meat, such as sausages or meat loaves. Bacon. Ham. Pepperoni. Hot dogs. Corned beef. Chipped beef. Salt pork. Jerky. Pickled herring. Anchovies and sardines. Regular canned tuna. Salted nuts. Dairy Processed cheese and cheese spreads. Hard cheeses. Cheese curds. Blue cheese. Feta cheese. String cheese. Regular cottage cheese. Buttermilk. Canned milk. Fats and oils Salted butter. Regular margarine. Ghee. Bacon fat. Seasonings and condiments Onion salt, garlic salt, seasoned salt, table salt, and sea salt. Canned and packaged gravies. Worcestershire sauce. Tartar sauce. Barbecue sauce. Teriyaki sauce. Soy sauce, including reduced-sodium. Steak sauce. Fish sauce. Oyster sauce. Cocktail sauce. Horseradish that you find on the shelf. Regular ketchup and mustard. Meat flavorings and tenderizers. Bouillon cubes. Hot sauce. Pre-made or packaged marinades. Pre-made or packaged taco seasonings. Relishes. Regular salad dressings. Salsa. Other foods Salted popcorn and pretzels. Corn chips and puffs. Potato and tortilla chips. Canned or dried soups. Pizza. Frozen entrees and pot pies. The items listed above may not be a complete list of foods and beverages you should avoid. Contact a dietitian for more information. Summary  Eating less sodium can help lower your blood pressure, reduce swelling, and protect your heart, liver, and kidneys.  Most people on this plan should limit their sodium intake to 1,500-2,000 mg (milligrams) of sodium each day.  Canned, boxed, and frozen foods are high in sodium. Restaurant foods, fast foods, and pizza are also very high in sodium. You  also get sodium by adding salt to food.  Try to cook at home, eat more fresh fruits and vegetables, and eat less fast food and canned, processed, or prepared foods. This information is not intended to replace advice given to you by your health care provider. Make sure you discuss any questions you have with your health care provider. Document Revised: 02/17/2019 Document Reviewed: 12/14/2018 Elsevier Patient Education  2021 [employer] Inc[employer]  

## 2020-02-20 NOTE — Progress Notes (Signed)
Established Patient Office Visit  Subjective:  Patient ID: Nicholas Hughes, male    DOB: [birth year]  Age: 50 y.o. MRN: [id number]  CC:  Chief Complaint  Patient presents with  . Follow-up    Follow up on BP, no concerns.     HPI Nicholas Hughes presents for a recheck of Hypertension. He reports doing well on Toprol 25 mg daily. Blood pressure readings at home have been 126-148/86-90's. Denies any concerns. Trying to exercise and follow a healthy diet.   Past Medical History:  Diagnosis Date  . ED (erectile dysfunction)   . GERD (gastroesophageal reflux disease)   . Hypertension   . LVH (left ventricular hypertrophy)   . OSA (obstructive sleep apnea) 07/14/2014   Moderate OSA with AHI 20/hr  . Renal insufficiency   . Transient atrial fibrillation or flutter     Past Surgical History:  Procedure Laterality Date  . TRANSTHORACIC ECHOCARDIOGRAM  05/07/2009   EF 60-65%    Family History  Problem Relation Age of Onset  . Hypertension Father   . Coronary artery disease Father     Social History   Socioeconomic History  . Marital status: Single    Spouse name: Not on file  . Number of children: Not on file  . Years of education: Not on file  . Highest education level: Not on file  Occupational History  . Not on file  Tobacco Use  . Smoking status: Never Smoker  . Smokeless tobacco: Never Used  Vaping Use  . Vaping Use: Never used  Substance and Sexual Activity  . Alcohol use: Not Currently    Comment: occasionally  . Drug use: No  . Sexual activity: Yes  Other Topics Concern  . Not on file  Social History Narrative   Right handed   One story home   Drinks no caffeine   Social Determinants of Health   Financial Resource Strain: Not on file  Food Insecurity: Not on file  Transportation Needs: Not on file  Physical Activity: Not on file  Stress: Not on file  Social Connections: Not on file  Intimate Partner Violence: Not on file    Outpatient  Medications Prior to Visit  Medication Sig Dispense Refill  . diltiazem (CARTIA XT) 300 MG 24 hr capsule Take 1 capsule (300 mg total) by mouth daily. 90 capsule 3  . triamterene-hydrochlorothiazide (MAXZIDE) 75-50 MG tablet Take 1 tablet by mouth daily. 90 tablet 3  . metoprolol succinate (TOPROL-XL) 25 MG 24 hr tablet Take 1 tablet (25 mg total) by mouth daily. 90 tablet 3   No facility-administered medications prior to visit.    No Known Allergies  ROS Review of Systems  Constitutional: Negative.   Respiratory: Negative.   Cardiovascular: Negative.   Gastrointestinal: Negative.   Endocrine: Negative.   Musculoskeletal: Negative.   Allergic/Immunologic: Negative.   Neurological: Negative.   Hematological: Negative.   Psychiatric/Behavioral: Negative.   All other systems reviewed and are negative.     Objective:    Physical Exam Vitals reviewed.  Constitutional:      Appearance: Normal appearance.  HENT:     Head: Normocephalic.  Cardiovascular:     Rate and Rhythm: Normal rate and regular rhythm.     Pulses: Normal pulses.     Heart sounds: Normal heart sounds.     Comments: BP recheck 140/90 Pulmonary:     Effort: Pulmonary effort is normal.     Breath sounds: Normal breath sounds.  Abdominal:  General: Abdomen is flat. Bowel sounds are normal.     Palpations: Abdomen is soft.  Musculoskeletal:        General: Normal range of motion.     Cervical back: Normal range of motion and neck supple.  Skin:    General: Skin is warm and dry.  Neurological:     General: No focal deficit present.     Mental Status: He is alert and oriented to person, place, and time.     BP (!) 148/92   Pulse 81   Temp 97.7 F (36.5 C) (Temporal)   Ht 6' (1.829 m)   Wt 257 lb 12.8 oz (116.9 kg)   SpO2 96%   BMI 34.96 kg/m  Wt Readings from Last 3 Encounters:  02/20/20 257 lb 12.8 oz (116.9 kg)  02/06/20 258 lb 12.8 oz (117.4 kg)  12/19/19 255 lb 12.8 oz (116 kg)      Health Maintenance Due  Topic Date Due  . Hepatitis C Screening  Never done  . HIV Screening  Never done  . COLONOSCOPY (Pts 45-71yrs Insurance coverage will need to be confirmed)  Never done    There are no preventive care reminders to display for this patient.  Lab Results  Component Value Date   TSH 0.77 02/14/2019   Lab Results  Component Value Date   WBC 6.2 02/04/2019   HGB 14.4 02/04/2019   HCT 44.3 02/04/2019   MCV 87.4 02/04/2019   PLT 248 02/04/2019   Lab Results  Component Value Date   NA 138 05/29/2019   K 3.4 (L) 05/29/2019   CO2 31 05/29/2019   GLUCOSE 91 05/29/2019   BUN 19 05/29/2019   CREATININE 1.34 05/29/2019   BILITOT 0.3 05/29/2019   ALKPHOS 51 05/29/2019   AST 23 05/29/2019   ALT 23 05/29/2019   PROT 7.3 05/29/2019   ALBUMIN 4.2 05/29/2019   CALCIUM 9.3 05/29/2019   ANIONGAP 8 02/04/2019   GFR 68.64 05/29/2019   Lab Results  Component Value Date   CHOL 158 03/07/2014   Lab Results  Component Value Date   HDL 45.80 03/07/2014   Lab Results  Component Value Date   LDLCALC 93 03/07/2014   Lab Results  Component Value Date   TRIG 94.0 03/07/2014   Lab Results  Component Value Date   CHOLHDL 3 03/07/2014   Lab Results  Component Value Date   HGBA1C 6.3 05/29/2019      Assessment & Plan:   Problem List Items Addressed This Visit    Essential hypertension - Primary   Relevant Medications   metoprolol succinate (TOPROL-XL) 50 MG 24 hr tablet      Meds ordered this encounter  Medications  . metoprolol succinate (TOPROL-XL) 50 MG 24 hr tablet    Sig: Take 1 tablet (50 mg total) by mouth daily.    Dispense:  30 tablet    Refill:  3   Advised patient to send updated blood pressure readings in 3 weeks via mychart. Agrees. If normal, advised to follow-up in 6 months and sooner as needed. Low sodium diet.   Follow-up: Return in about 6 months (around 08/19/2020).    Eulis Foster, FNP

## 2020-05-29 DIAGNOSIS — S5002XA Contusion of left elbow, initial encounter: Secondary | ICD-10-CM | POA: Diagnosis not present

## 2020-10-24 ENCOUNTER — Other Ambulatory Visit: Payer: Self-pay | Admitting: Family

## 2020-10-29 NOTE — Telephone Encounter (Signed)
Pt requesting refill on Metoprolol Succinate 50 MG ... last prescribed by Worthy Rancher

## 2020-11-04 ENCOUNTER — Ambulatory Visit (INDEPENDENT_AMBULATORY_CARE_PROVIDER_SITE_OTHER): Payer: BC Managed Care – PPO | Admitting: Family Medicine

## 2020-11-04 ENCOUNTER — Other Ambulatory Visit: Payer: Self-pay

## 2020-11-04 ENCOUNTER — Encounter: Payer: Self-pay | Admitting: Family Medicine

## 2020-11-04 VITALS — BP 154/88 | HR 72 | Temp 97.5°F | Ht 72.0 in | Wt 272.2 lb

## 2020-11-04 DIAGNOSIS — Z0000 Encounter for general adult medical examination without abnormal findings: Secondary | ICD-10-CM

## 2020-11-04 DIAGNOSIS — R7309 Other abnormal glucose: Secondary | ICD-10-CM

## 2020-11-04 DIAGNOSIS — I4891 Unspecified atrial fibrillation: Secondary | ICD-10-CM | POA: Diagnosis not present

## 2020-11-04 DIAGNOSIS — I10 Essential (primary) hypertension: Secondary | ICD-10-CM | POA: Diagnosis not present

## 2020-11-04 DIAGNOSIS — M778 Other enthesopathies, not elsewhere classified: Secondary | ICD-10-CM | POA: Insufficient documentation

## 2020-11-04 DIAGNOSIS — E876 Hypokalemia: Secondary | ICD-10-CM

## 2020-11-04 DIAGNOSIS — M7022 Olecranon bursitis, left elbow: Secondary | ICD-10-CM

## 2020-11-04 MED ORDER — TRIAMTERENE-HCTZ 75-50 MG PO TABS: 1.0000 | ORAL_TABLET | Freq: Every day | ORAL | 1 refills | Status: DC

## 2020-11-04 NOTE — Progress Notes (Addendum)
Established Patient Office Visit  Subjective:  Patient ID: Nicholas Hughes, male    DOB: 08-15-1970  Age: 50 y.o. MRN: [id number]  CC:  Chief Complaint  Patient presents with   Follow-up    Follow up on BP, patient injured left arm 3 months ago still having pains. Concerns about weight gain    HPI Nicholas Hughes presents for follow-up of his blood pressure, elevated glucose and left elbow pain.  Traumatic injury to left elbow 5 months ago it remains tender to the touch.  Blood pressure by his cuff has been running in the 130s over 90s.  He is also seeing cardiology because of his history of hypertension with transient atrial fibrillation.  Denies any recent palpitation shortness of breath chest pain.  He is compliant with all of his current medicines.  Past Medical History:  Diagnosis Date   ED (erectile dysfunction)    GERD (gastroesophageal reflux disease)    Hypertension    LVH (left ventricular hypertrophy)    OSA (obstructive sleep apnea) 07/14/2014   Moderate OSA with AHI 20/hr   Renal insufficiency    Transient atrial fibrillation or flutter     Past Surgical History:  Procedure Laterality Date   TRANSTHORACIC ECHOCARDIOGRAM  05/07/2009   EF 60-65%    Family History  Problem Relation Age of Onset   Hypertension Father    Coronary artery disease Father     Social History   Socioeconomic History   Marital status: Single    Spouse name: Not on file   Number of children: Not on file   Years of education: Not on file   Highest education level: Not on file  Occupational History   Not on file  Tobacco Use   Smoking status: Never   Smokeless tobacco: Never  Vaping Use   Vaping Use: Never used  Substance and Sexual Activity   Alcohol use: Not Currently    Comment: occasionally   Drug use: No   Sexual activity: Yes  Other Topics Concern   Not on file  Social History Narrative   Right handed   One story home   Drinks no caffeine   Social Determinants of  Health   Financial Resource Strain: Not on file  Food Insecurity: Not on file  Transportation Needs: Not on file  Physical Activity: Not on file  Stress: Not on file  Social Connections: Not on file  Intimate Partner Violence: Not on file    Outpatient Medications Prior to Visit  Medication Sig Dispense Refill   diltiazem (CARTIA XT) 300 MG 24 hr capsule Take 1 capsule (300 mg total) by mouth daily. 90 capsule 3   metoprolol succinate (TOPROL-XL) 50 MG 24 hr tablet Take 1 tablet by mouth once daily 30 tablet 0   triamterene-hydrochlorothiazide (MAXZIDE) 75-50 MG tablet Take 1 tablet by mouth daily. 90 tablet 3   No facility-administered medications prior to visit.    No Known Allergies  ROS Review of Systems  Constitutional: Negative.   HENT: Negative.    Eyes:  Negative for photophobia and visual disturbance.  Respiratory: Negative.  Negative for shortness of breath and wheezing.   Cardiovascular: Negative.   Gastrointestinal: Negative.   Endocrine: Negative for polyphagia and polyuria.  Genitourinary: Negative.   Musculoskeletal:  Positive for arthralgias.  Neurological: Negative.   Psychiatric/Behavioral: Negative.       Objective:    Physical Exam Vitals and nursing note reviewed.  Constitutional:      General: He  is not in acute distress.    Appearance: Normal appearance. He is not ill-appearing, toxic-appearing or diaphoretic.  HENT:     Head: Normocephalic and atraumatic.     Right Ear: Tympanic membrane, ear canal and external ear normal.     Left Ear: Tympanic membrane, ear canal and external ear normal.     Mouth/Throat:     Mouth: Mucous membranes are moist.     Pharynx: Oropharynx is clear. No oropharyngeal exudate or posterior oropharyngeal erythema.  Eyes:     General: No scleral icterus.       Right eye: No discharge.        Left eye: No discharge.     Extraocular Movements: Extraocular movements intact.     Conjunctiva/sclera: Conjunctivae  normal.     Pupils: Pupils are equal, round, and reactive to light.  Neck:     Vascular: No carotid bruit.  Cardiovascular:     Rate and Rhythm: Regular rhythm.  Pulmonary:     Effort: Pulmonary effort is normal.     Breath sounds: Normal breath sounds.  Abdominal:     General: Bowel sounds are normal.  Musculoskeletal:     Left elbow: No swelling, deformity or effusion. Normal range of motion. Tenderness present.       Arms:     Cervical back: No rigidity or tenderness.  Lymphadenopathy:     Cervical: No cervical adenopathy.  Neurological:     Mental Status: He is alert.  Psychiatric:        Behavior: Behavior normal.  In today  BP (!) 154/88 (BP Location: Right Arm, Patient Position: Sitting, Cuff Size: Large)   Pulse 72   Temp (!) 97.5 F (36.4 C) (Temporal)   Ht 6' (1.829 m)   Wt 272 lb 3.2 oz (123.5 kg)   SpO2 98%   BMI 36.92 kg/m  Wt Readings from Last 3 Encounters:  11/04/20 272 lb 3.2 oz (123.5 kg)  02/20/20 257 lb 12.8 oz (116.9 kg)  02/06/20 258 lb 12.8 oz (117.4 kg)     Health Maintenance Due  Topic Date Due   HIV Screening  Never done   Hepatitis C Screening  Never done   COLONOSCOPY (Pts 45-91yrs Insurance coverage will need to be confirmed)  Never done   Zoster Vaccines- Shingrix (1 of 2) Never done    There are no preventive care reminders to display for this patient.  Lab Results  Component Value Date   TSH 0.77 02/14/2019   Lab Results  Component Value Date   WBC 5.5 11/04/2020   HGB 13.9 11/04/2020   HCT 41.1 11/04/2020   MCV 86.0 11/04/2020   PLT 290.0 11/04/2020   Lab Results  Component Value Date   NA 138 11/04/2020   K 3.3 (L) 11/04/2020   CO2 28 11/04/2020   GLUCOSE 92 11/04/2020   BUN 16 11/04/2020   CREATININE 1.32 11/04/2020   BILITOT 0.3 11/04/2020   ALKPHOS 58 11/04/2020   AST 22 11/04/2020   ALT 21 11/04/2020   PROT 7.5 11/04/2020   ALBUMIN 4.1 11/04/2020   CALCIUM 9.6 11/04/2020   ANIONGAP 8 02/04/2019   GFR  63.06 11/04/2020   Lab Results  Component Value Date   CHOL 174 11/04/2020   Lab Results  Component Value Date   HDL 51.40 11/04/2020   Lab Results  Component Value Date   LDLCALC 97 11/04/2020   Lab Results  Component Value Date   TRIG 129.0 11/04/2020  Lab Results  Component Value Date   CHOLHDL 3 11/04/2020   Lab Results  Component Value Date   HGBA1C 6.5 11/04/2020      Assessment & Plan:   Problem List Items Addressed This Visit       Cardiovascular and Mediastinum   Essential hypertension - Primary   Relevant Medications   triamterene-hydrochlorothiazide (MAXZIDE) 75-50 MG tablet   Other Relevant Orders   Comprehensive metabolic panel (Completed)   Lipid panel (Completed)   Urinalysis, Routine w reflex microscopic (Completed)   Microalbumin / creatinine urine ratio (Completed)   Ambulatory referral to Cardiology   Atrial fibrillation, transient (HCC)   Relevant Medications   triamterene-hydrochlorothiazide (MAXZIDE) 75-50 MG tablet   Other Relevant Orders   Ambulatory referral to Cardiology     Musculoskeletal and Integument   Olecranon bursitis of left elbow   Relevant Orders   Ambulatory referral to Sports Medicine     Other   Elevated glucose   Relevant Orders   Comprehensive metabolic panel (Completed)   Hemoglobin A1c (Completed)   Other Visit Diagnoses     Healthcare maintenance       Relevant Orders   CBC (Completed)   Lipid panel (Completed)   PSA (Completed)   Hypokalemia       Relevant Medications   potassium chloride SA (KLOR-CON) 20 MEQ tablet       Meds ordered this encounter  Medications   triamterene-hydrochlorothiazide (MAXZIDE) 75-50 MG tablet    Sig: Take 1 tablet by mouth daily.    Dispense:  90 tablet    Refill:  1   potassium chloride SA (KLOR-CON) 20 MEQ tablet    Sig: Take 1 tablet (20 mEq total) by mouth daily.    Dispense:  30 tablet    Refill:  3     Follow-up: Return in about 2 months (around  01/04/2021), or Return for physical exam in a few months.Mliss Sax, MD

## 2020-11-05 ENCOUNTER — Telehealth: Payer: Self-pay | Admitting: Family Medicine

## 2020-11-05 LAB — URINALYSIS, ROUTINE W REFLEX MICROSCOPIC
Bilirubin Urine: NEGATIVE
Hgb urine dipstick: NEGATIVE
Ketones, ur: NEGATIVE
Leukocytes,Ua: NEGATIVE
Nitrite: NEGATIVE
RBC / HPF: NONE SEEN (ref 0–?)
Specific Gravity, Urine: 1.02 (ref 1.000–1.030)
Total Protein, Urine: NEGATIVE
Urine Glucose: NEGATIVE
Urobilinogen, UA: 0.2 (ref 0.0–1.0)
pH: 7 (ref 5.0–8.0)

## 2020-11-05 LAB — LIPID PANEL
Cholesterol: 174 mg/dL (ref 0–200)
HDL: 51.4 mg/dL (ref >39.00)
LDL Cholesterol: 97 mg/dL (ref 0–99)
NonHDL: 122.44
Total CHOL/HDL Ratio: 3
Triglycerides: 129 mg/dL (ref 0.0–149.0)
VLDL: 25.8 mg/dL (ref 0.0–40.0)

## 2020-11-05 LAB — MICROALBUMIN / CREATININE URINE RATIO
Creatinine,U: 73.6 mg/dL
Microalb Creat Ratio: 1 mg/g (ref 0.0–30.0)
Microalb, Ur: <0.7 mg/dL (ref 0.0–1.9)

## 2020-11-05 LAB — CBC
HCT: 41.1 % (ref 39.0–52.0)
Hemoglobin: 13.9 g/dL (ref 13.0–17.0)
MCHC: 33.8 g/dL (ref 30.0–36.0)
MCV: 86 fl (ref 78.0–100.0)
Platelets: 290 10*3/uL (ref 150.0–400.0)
RBC: 4.78 Mil/uL (ref 4.22–5.81)
RDW: 13.3 % (ref 11.5–15.5)
WBC: 5.5 10*3/uL (ref 4.0–10.5)

## 2020-11-05 LAB — COMPREHENSIVE METABOLIC PANEL
ALT: 21 U/L (ref 0–53)
AST: 22 U/L (ref 0–37)
Albumin: 4.1 g/dL (ref 3.5–5.2)
Alkaline Phosphatase: 58 U/L (ref 39–117)
BUN: 16 mg/dL (ref 6–23)
CO2: 28 mEq/L (ref 19–32)
Calcium: 9.6 mg/dL (ref 8.4–10.5)
Chloride: 103 mEq/L (ref 96–112)
Creatinine, Ser: 1.32 mg/dL (ref 0.40–1.50)
GFR: 63.06 mL/min (ref >60.00)
Glucose, Bld: 92 mg/dL (ref 70–99)
Potassium: 3.3 mEq/L — ABNORMAL LOW (ref 3.5–5.1)
Sodium: 138 mEq/L (ref 135–145)
Total Bilirubin: 0.3 mg/dL (ref 0.2–1.2)
Total Protein: 7.5 g/dL (ref 6.0–8.3)

## 2020-11-05 LAB — HEMOGLOBIN A1C: Hgb A1c MFr Bld: 6.5 % (ref 4.6–6.5)

## 2020-11-05 LAB — PSA: PSA: 0.67 ng/mL (ref 0.10–4.00)

## 2020-11-05 MED ORDER — POTASSIUM CHLORIDE CRYS ER 20 MEQ PO TBCR
20.0000 meq | EXTENDED_RELEASE_TABLET | Freq: Every day | ORAL | 3 refills | Status: DC
Start: 2020-11-05 — End: 2021-01-06

## 2020-11-05 NOTE — Addendum Note (Signed)
Addended by: Nadene Rubins A on: 11/05/2020 12:50 PM   Modules accepted: Orders

## 2020-11-05 NOTE — Telephone Encounter (Signed)
Ry is calling from [facility] concerned over pt taking potassium chloride SA (KLOR-CON) 20 MEQ tablet [[id number]] and triamterene-hydrochlorothiazide (MAXZIDE) 75-50 MG tablet [[id number]]. He said this could lead to a dangerously high potassium level.  Please advise Ry at [phone].

## 2020-11-05 NOTE — Progress Notes (Signed)
Subjective:    CC: L elbow pain  I, Nicholas Hughes, LAT, ATC, am serving as scribe for Dr. Clementeen Hughes.  HPI: Pt is a 50 y/o male presenting w/ L elbow pain since 05/28/20 when he hit his L elbow on a pallet at work after jumping down from another pallet.  Pt is R-hand dominate. He was seen at the [facility] on 05/29/20 w/ pain and swelling at his L distal humerus and proximal radius.  Today, he reports con't L elbow pain.  He locates his pain to posterior aspect of elbow over the olecranon process. Pt notes he can feel a small "nodule" present.   He notes some pain with elbow extension and notes some weakness especially noticeable with weight lifting.  He had reduced exercise because of his elbow discomfort.  He has been able to continue working.    L elbow swelling: yes- has improved Radiating: no Aggravating factors: gripping w/ elbow ext, picking things up Treatments tried: epsom salt, icy hot  Diagnostic testing: L elbow XR at [facility]- 05/29/20  Pertinent review of Systems: No fevers or chills  Relevant historical information: Hypertension, atrial fibrillation transient,   Objective:    Vitals:   11/06/20 0839  BP: (!) 160/98  Pulse: 65  SpO2: 98%   General: Well Developed, well nourished, and in no acute distress.   MSK: Left elbow, normal-appearing with no swelling at olecranon. Elbow motion is intact. Elbow is nontender. Small palpable nodule present at distal triceps tendon approximately 2 cm proximal to insertion on the olecranon. Strength is intact without significant pain.  Lab and Radiology Results  Diagnostic Limited MSK Ultrasound of: Left triceps tendon Triceps tendon intact at that insertion. However approximately 2 cm proximal to insertion on olecranon tendon is thickened with some irregularity without disruption.  No significant calcification or vascularity present.  Tendon appearance is consistent with tendon nodule or prior tendon injury  now healed Impression: Prior tendon injury or tendon nodule  Formatting of this note might be different from the original.  TECHNIQUE: XR ELBOW MIN 3 VIEWS LEFT Over read interpretation provided.  Primary interpretation provided by on-site provider referenced in electronic medical record at time of over read.   PRIMARY INTERPRETATION:Left elbow injury last evening; the patient struck his left elbow on a  pallet after jumping; normal ROM with pain at the distal humerus primarily. The imaging studies did not identify any gross orthopedic  fracture or anomaly.  Results viewed in EMR/EPIC:  Primary read performed by Nicholas Hughes     AGREE/DISAGREE: Agree with primary interpretation   ADDITIONAL FINDINGS:None   Note:  Discordant or unexpected findings sent via EPIC Inbox Message to provider and will be reconciled by on-site provider on receipt of radiologist over read.   Electronically Signed by: Bernadene Bell on 05/30/2020 8:16 AM  Impression and Recommendations:    Assessment and Plan: 50 y.o. male with left triceps tendon injury healed or healing.  Functionally Collen is doing quite well now about 5 months out from his original injury.  However based on his ultrasound appearance he does have evidence that he injured his triceps tendon.  However functionally on physical exam and ultrasound examination the tendon appears to be reasonably well-healed.  We will treat with eccentric exercises taught in clinic today by ATC and recheck in 6 weeks.  This should improve his function, strength, and ultimately will remodel the tendon and improve the palpable nodule to a great extent.  If needed next steps could be nitroglycerin patch protocol or even MRI.Nicholas Hughes  PDMP not reviewed this encounter. Orders Placed This Encounter  Procedures   [place] LIMITED JOINT SPACE STRUCTURES UP LEFT(NO LINKED CHARGES)    Standing Status:   Future    Number of Occurrences:   1    Standing Expiration Date:   05/07/2021     Order Specific Question:   Reason for Exam (SYMPTOM  OR DIAGNOSIS REQUIRED)    Answer:   left elbow pain    Order Specific Question:   Preferred imaging location?    Answer:   [facility]   No orders of the defined types were placed in this encounter.   Discussed warning signs or symptoms. Please see discharge instructions. Patient expresses understanding.   The above documentation has been reviewed and is accurate and complete Clementeen Hughes, M.D.

## 2020-11-06 ENCOUNTER — Ambulatory Visit: Payer: Self-pay

## 2020-11-06 ENCOUNTER — Ambulatory Visit (INDEPENDENT_AMBULATORY_CARE_PROVIDER_SITE_OTHER): Payer: BC Managed Care – PPO | Admitting: Family Medicine

## 2020-11-06 ENCOUNTER — Other Ambulatory Visit: Payer: Self-pay

## 2020-11-06 VITALS — BP 160/98 | HR 65 | Ht 72.0 in | Wt 270.0 lb

## 2020-11-06 DIAGNOSIS — G8929 Other chronic pain: Secondary | ICD-10-CM | POA: Diagnosis not present

## 2020-11-06 DIAGNOSIS — M778 Other enthesopathies, not elsewhere classified: Secondary | ICD-10-CM | POA: Diagnosis not present

## 2020-11-06 DIAGNOSIS — M25522 Pain in left elbow: Secondary | ICD-10-CM

## 2020-11-06 NOTE — Telephone Encounter (Signed)
Please advise.   Thanks. Dm/cma  

## 2020-11-06 NOTE — Patient Instructions (Addendum)
Thank you for coming in today.   Please complete the exercises that the athletic trainer went over with you:  View at [email or web address]-[email or web address] using code: [id number]  Recheck back in 6 weeks.

## 2020-11-07 NOTE — Telephone Encounter (Signed)
Pharmacist notified that we are aware and will be checking his potassium in 2 weeks.  Dm/cma

## 2020-11-27 ENCOUNTER — Other Ambulatory Visit: Payer: Self-pay | Admitting: Family

## 2020-11-29 ENCOUNTER — Telehealth: Payer: Self-pay

## 2020-11-29 DIAGNOSIS — I10 Essential (primary) hypertension: Secondary | ICD-10-CM

## 2020-11-29 MED ORDER — METOPROLOL SUCCINATE ER 50 MG PO TB24: 50.0000 mg | ORAL_TABLET | Freq: Every day | ORAL | 0 refills | Status: DC

## 2020-11-29 NOTE — Telephone Encounter (Signed)
Rx sent in patient aware and will pick up  

## 2020-11-29 NOTE — Telephone Encounter (Signed)
Pt has appt schedule 01/06/21. He is out of  Metoprolol 50 mg 24hr tablet He hasn't had any medication for 2 days. Asking if a refill can be sent to [facility].  Thank you

## 2020-12-14 ENCOUNTER — Encounter: Payer: Self-pay | Admitting: Physician Assistant

## 2020-12-14 NOTE — Progress Notes (Deleted)
Cardiology Office Note    Date:  12/14/2020   ID:  Nicholas Hughes, DOB 1970-09-12, MRN [id number]  PCP:  Nicholas Sax, MD  Cardiologist:  Nicholas Miss, MD  Electrophysiologist:  None   Chief Complaint: ***  History of Present Illness:   Nicholas Hughes is a 50 y.o. male with history of HTN, PAF, OSA, GERD, borderline diabetes mellitus, CKD stage 2 by labs who presents for follow-up. He has a very remote history of transient atrial fibrillation in 04/2009. He spontaneously converted to NSR and has been managed with rate control agents. He was not placed on anticoagulation. Has not had any recent clinical recurrences. LVH is listed in history. Prior echo 2009 showed LV wall thickness upper limits of normal. Last echo 2011 EF 60-65%, mild MR, mild LAE, PASP , no mention of abnormal LV thickness.   Repeat potassium Diabetes mgmt per PCP Who does OSA  Paroxysmal atrial fibrillation Essential HTN Pre-diabetes  Hypokalemia by last labs Obstructive sleep apnea  Labwork independently reviewed: 10/2020 CBC wnl, K 3.3, Cr 1.32, LFTs ok, LDL 97, trig 129, A1C 6.5 04/1738 TSH wnl, Mg 1.8   Past Medical History:  Diagnosis Date   ED (erectile dysfunction)    GERD (gastroesophageal reflux disease)    Hypertension    LVH (left ventricular hypertrophy)    OSA (obstructive sleep apnea) 07/14/2014   Moderate OSA with AHI 20/hr   Renal insufficiency    Transient atrial fibrillation or flutter     Past Surgical History:  Procedure Laterality Date   TRANSTHORACIC ECHOCARDIOGRAM  05/07/2009   EF 60-65%    Current Medications: No outpatient medications have been marked as taking for the 12/17/20 encounter (Appointment) with Nicholas Montana, PA-C.   ***   Allergies:   Patient has no known allergies.   Social History   Socioeconomic History   Marital status: Single    Spouse name: Not on file   Number of children: Not on file   Years of education: Not on file    Highest education level: Not on file  Occupational History   Not on file  Tobacco Use   Smoking status: Never   Smokeless tobacco: Never  Vaping Use   Vaping Use: Never used  Substance and Sexual Activity   Alcohol use: Not Currently    Comment: occasionally   Drug use: No   Sexual activity: Yes  Other Topics Concern   Not on file  Social History Narrative   Right handed   One story home   Drinks no caffeine   Social Determinants of Health   Financial Resource Strain: Not on file  Food Insecurity: Not on file  Transportation Needs: Not on file  Physical Activity: Not on file  Stress: Not on file  Social Connections: Not on file     Family History:  The patient's ***family history includes Coronary artery disease in his father; Hypertension in his father.  ROS:   Please see the history of present illness. Otherwise, review of systems is positive for ***.  All other systems are reviewed and otherwise negative.    EKGs/Labs/Other Studies Reviewed:    Studies reviewed are outlined and summarized above. Reports included below if pertinent.  2D echo 04/2009  - Left ventricle: Systolic function was normal. The estimated     ejection fraction was in the range of 60% to 65%.   - Mitral valve: Mild regurgitation.   - Left atrium: The atrium was mildly dilated.   -  Pulmonary arteries: PA peak pressure: 57mm Hg (S).   Transthoracic echocardiography. M-mode, complete 2D, spectral   Doppler, and color Doppler. Height: Height: 185.4cm. Height: 73in.   Weight: Weight: 100kg. Weight: 220lb. Body mass index: BMI:   29.1kg/m^2. Body surface area: BSA: 2.9m^2. Blood pressure: 106/63.   Patient status: Inpatient. Location: ICU/CCU     EKG:  EKG is ordered today, personally reviewed, demonstrating ***  Recent Labs: 11/04/2020: ALT 21; BUN 16; Creatinine, Ser 1.32; Hemoglobin 13.9; Platelets 290.0; Potassium 3.3; Sodium 138  Recent Lipid Panel    Component Value Date/Time    CHOL 174 11/04/2020 1559   TRIG 129.0 11/04/2020 1559   HDL 51.40 11/04/2020 1559   CHOLHDL 3 11/04/2020 1559   VLDL 25.8 11/04/2020 1559   LDLCALC 97 11/04/2020 1559    PHYSICAL EXAM:    VS:  There were no vitals taken for this visit.  BMI: There is no height or weight on file to calculate BMI.  GEN: Well nourished, well developed male in no acute distress HEENT: normocephalic, atraumatic Neck: no JVD, carotid bruits, or masses Cardiac: ***RRR; no murmurs, rubs, or gallops, no edema  Respiratory:  clear to auscultation bilaterally, normal work of breathing GI: soft, nontender, nondistended, + BS MS: no deformity or atrophy Skin: warm and dry, no rash Neuro:  Alert and Oriented x 3, Strength and sensation are intact, follows commands Psych: euthymic mood, full affect  Wt Readings from Last 3 Encounters:  11/06/20 270 lb (122.5 kg)  11/04/20 272 lb 3.2 oz (123.5 kg)  02/20/20 257 lb 12.8 oz (116.9 kg)     ASSESSMENT & PLAN:   ***     Disposition: F/u with ***   Medication Adjustments/Labs and Tests Ordered: Current medicines are reviewed at length with the patient today.  Concerns regarding medicines are outlined above. Medication changes, Labs and Tests ordered today are summarized above and listed in the Patient Instructions accessible in Encounters.   Signed, Nicholas Montana, PA-C  12/14/2020 5:37 PM    [facility] [address] [place], [place], [place]  [address] Phone: [phone]; Fax: [phone]

## 2020-12-16 NOTE — Progress Notes (Signed)
I, Christoper Fabian, LAT, ATC, am serving as scribe for Dr. Clementeen Graham.  Nicholas Hughes is a 50 y.o. RHD male who presents to [facility] today for f/u of L elbow pain since 05/28/20 when he hit his L elbow on a pallet at work after jumping down from another pallet.  He was last seen by Dr. Denyse Amass on 11/06/20 and was shown a HEP focusing on eccentric triceps exercises.  Today, pt reports that he con't to have some pain and swelling in his L elbow but it is better than before.  He con't to have pain w/ resisted triceps exercises such as triceps dips, bench press and overhead triceps extensions.     Additionally he notes right knee pain.  This has been ongoing for about a month.  Pain associated with the swelling.  Pain is present when he stands from a seated position and goes up and down stairs and sits for prolonged period of time.  He notes some clicking and popping but denies locking and catching.  Diagnostic testing: L elbow XR- 01/23/14  Pertinent review of systems: No fevers or chills  Relevant historical information: Hypertension.  A. fib.  Does not take erectile dysfunction medication.   Exam:  BP 140/90 (BP Location: Left Arm, Patient Position: Sitting, Cuff Size: Large)   Pulse 70   Ht 6' (1.829 m)   Wt 273 lb 9.6 oz (124.1 kg)   SpO2 97%   BMI 37.11 kg/m  General: Well Developed, well nourished, and in no acute distress.   MSK: Left elbow normal-appearing Palpable nodule at distal triceps tendon Mildly tender palpation in this region. Pain with resisted elbow extension. Pulses cap refill and sensation are intact distally.  Right knee mild effusion otherwise normal. Normal motion with crepitation.  Tender palpation medial and lateral joint line. Stable ligamentous exam. Mildly positive McMurray's test.  Intact strength.   Lab and Radiology Results Diagnostic Limited MSK Ultrasound of: Left distal triceps Triceps at insertion onto olecranon  does have calcific change consistent with chronic calcific tendinopathy.  However patient is not painful in this region. About 3 cm proximal to triceps insertion on olecranon tendon is slightly thickened and irregular appearing without visible tear.  This is consistent with evidence of prior tear and healing response.  Minimal increase Doppler activity. Impression: Evidence of prior triceps tendon injury with no current tendon tear.  Procedure: Real-time Ultrasound Guided Injection of right knee superior lateral patellar space Device: Philips Affiniti 50G Images permanently stored and available for review in PACS Verbal informed consent obtained.  Discussed risks and benefits of procedure. Warned about infection bleeding damage to structures skin hypopigmentation and fat atrophy among others. Patient expresses understanding and agreement Time-out conducted.   Noted no overlying erythema, induration, or other signs of local infection.   Skin prepped in a sterile fashion.   Local anesthesia: Topical Ethyl chloride.   With sterile technique and under real time ultrasound guidance: 40 mg of Kenalog and 2 mL of Marcaine injected into the knee joint. Fluid seen entering the joint capsule.   Completed without difficulty   Pain immediately resolved suggesting accurate placement of the medication.   Advised to call if fevers/chills, erythema, induration, drainage, or persistent bleeding.   Images permanently stored and available for review in the ultrasound unit.  Impression: Technically successful ultrasound guided injection.   Xray images right knee obtained today personally and independently interpreted Mild DJD.  No acute fractures.  Osteophyte  superior patellar pole. Await formal radiology review   Assessment and Plan: 50 y.o. male with left triceps tendon pain following injury.  Patient is clinically improving but not doing as well as we would like. Plan to add nitroglycerin patch protocol.   Continue home exercise program.  Recheck back in about a month.  Right knee pain due to exacerbation of DJD.  Plan for steroid injection.  Recommend Voltaren gel and compression sleeve.  Recheck in 1 month.    PDMP not reviewed this encounter. Orders Placed This Encounter  Procedures   [place] LIMITED JOINT SPACE STRUCTURES UP LEFT(NO LINKED CHARGES)    Order Specific Question:   Reason for Exam (SYMPTOM  OR DIAGNOSIS REQUIRED)    Answer:   L elbow pain    Order Specific Question:   Preferred imaging location?    Answer:   [facility] Knee AP/LAT W/[place] Right    Standing Status:   Future    Number of Occurrences:   1    Standing Expiration Date:   01/17/2021    Order Specific Question:   Reason for Exam (SYMPTOM  OR DIAGNOSIS REQUIRED)    Answer:   R knee pain    Order Specific Question:   Preferred imaging location?    Answer:   [facility]   No orders of the defined types were placed in this encounter.    Discussed warning signs or symptoms. Please see discharge instructions. Patient expresses understanding.   The above documentation has been reviewed and is accurate and complete Clementeen Graham, M.D.

## 2020-12-17 ENCOUNTER — Ambulatory Visit: Payer: BC Managed Care – PPO | Admitting: Physician Assistant

## 2020-12-17 DIAGNOSIS — I10 Essential (primary) hypertension: Secondary | ICD-10-CM

## 2020-12-17 DIAGNOSIS — R7303 Prediabetes: Secondary | ICD-10-CM

## 2020-12-17 DIAGNOSIS — I480 Paroxysmal atrial fibrillation: Secondary | ICD-10-CM

## 2020-12-17 DIAGNOSIS — E876 Hypokalemia: Secondary | ICD-10-CM

## 2020-12-17 DIAGNOSIS — G4733 Obstructive sleep apnea (adult) (pediatric): Secondary | ICD-10-CM

## 2020-12-18 ENCOUNTER — Ambulatory Visit (INDEPENDENT_AMBULATORY_CARE_PROVIDER_SITE_OTHER): Payer: BC Managed Care – PPO

## 2020-12-18 ENCOUNTER — Other Ambulatory Visit: Payer: Self-pay

## 2020-12-18 ENCOUNTER — Ambulatory Visit (INDEPENDENT_AMBULATORY_CARE_PROVIDER_SITE_OTHER): Payer: BC Managed Care – PPO | Admitting: Family Medicine

## 2020-12-18 ENCOUNTER — Encounter: Payer: Self-pay | Admitting: Family Medicine

## 2020-12-18 ENCOUNTER — Ambulatory Visit: Payer: Self-pay

## 2020-12-18 VITALS — BP 140/90 | HR 70 | Ht 72.0 in | Wt 273.6 lb

## 2020-12-18 DIAGNOSIS — M25561 Pain in right knee: Secondary | ICD-10-CM

## 2020-12-18 DIAGNOSIS — M1711 Unilateral primary osteoarthritis, right knee: Secondary | ICD-10-CM

## 2020-12-18 DIAGNOSIS — R600 Localized edema: Secondary | ICD-10-CM | POA: Diagnosis not present

## 2020-12-18 DIAGNOSIS — M778 Other enthesopathies, not elsewhere classified: Secondary | ICD-10-CM

## 2020-12-18 NOTE — Patient Instructions (Addendum)
Good to see you today.  Con't eccentric triceps extension exercises.  Nitroglycerin Protocol  Apply 1/4 nitroglycerin patch to affected area daily. Change position of patch within the affected area every 24 hours. You may experience a headache during the first 1-2 weeks of using the patch, these should subside. If you experience headaches after beginning nitroglycerin patch treatment, you may take your preferred over the counter pain reliever. Another side effect of the nitroglycerin patch is skin irritation or rash related to patch adhesive. Please notify our office if you develop more severe headaches or rash, and stop the patch. Tendon healing with nitroglycerin patch may require 12 to 24 weeks depending on the extent of injury. Men should not use if taking Viagra, Cialis, or Levitra.  Do not use if you have migraines or rosacea.    You had a R knee injection.  Call or go to the ER if you develop a large red swollen joint with extreme pain or oozing puss.   Please use Voltaren gel (Generic Diclofenac Gel) up to 4x daily for pain as needed.  This is available over-the-counter as both the name brand Voltaren gel and the generic diclofenac gel.   I recommend you obtained a compression sleeve to help with your joint problems. There are many options on the market however I recommend obtaining a knee Body Helix compression sleeve.  You can find information (including how to appropriate measure yourself for sizing) can be found at [email or web address].ch.  Many of these products are health savings account (HSA) eligible.   You can use the compression sleeve at any time throughout the day but is most important to use while being active as well as for 2 hours post-activity.   It is appropriate to ice following activity with the compression sleeve in place.   Please get an Xray today before you leave.  Follow-up: 4 weeks

## 2020-12-18 NOTE — Progress Notes (Signed)
Right knee x-ray shows mild arthritis.  No fracture

## 2020-12-30 ENCOUNTER — Telehealth: Payer: Self-pay | Admitting: Family Medicine

## 2020-12-30 ENCOUNTER — Other Ambulatory Visit: Payer: Self-pay | Admitting: Physical Therapy

## 2020-12-30 ENCOUNTER — Other Ambulatory Visit: Payer: Self-pay | Admitting: Family Medicine

## 2020-12-30 DIAGNOSIS — I10 Essential (primary) hypertension: Secondary | ICD-10-CM

## 2020-12-30 MED ORDER — NITROGLYCERIN 0.2 MG/HR TD PT24: MEDICATED_PATCH | TRANSDERMAL | 1 refills | Status: DC

## 2020-12-30 NOTE — Telephone Encounter (Signed)
Pt states at last visit Dr. Denyse Amass was going to call in Nitroglycerine patches, pharmacy has not recd.

## 2020-12-30 NOTE — Telephone Encounter (Signed)
Nitroglycerin patches ordered to pt's preferred pharmacy.

## 2021-01-06 ENCOUNTER — Encounter: Payer: Self-pay | Admitting: Family Medicine

## 2021-01-06 ENCOUNTER — Ambulatory Visit (INDEPENDENT_AMBULATORY_CARE_PROVIDER_SITE_OTHER): Payer: BC Managed Care – PPO | Admitting: Family Medicine

## 2021-01-06 ENCOUNTER — Other Ambulatory Visit: Payer: Self-pay

## 2021-01-06 ENCOUNTER — Other Ambulatory Visit: Payer: Self-pay | Admitting: Cardiovascular Disease

## 2021-01-06 VITALS — BP 142/92 | HR 78 | Temp 97.7°F | Ht 72.0 in | Wt 269.0 lb

## 2021-01-06 DIAGNOSIS — E876 Hypokalemia: Secondary | ICD-10-CM | POA: Diagnosis not present

## 2021-01-06 DIAGNOSIS — I4891 Unspecified atrial fibrillation: Secondary | ICD-10-CM

## 2021-01-06 DIAGNOSIS — Z0000 Encounter for general adult medical examination without abnormal findings: Secondary | ICD-10-CM

## 2021-01-06 DIAGNOSIS — I10 Essential (primary) hypertension: Secondary | ICD-10-CM

## 2021-01-06 LAB — BASIC METABOLIC PANEL
BUN: 19 mg/dL (ref 6–23)
CO2: 27 mEq/L (ref 19–32)
Calcium: 9.3 mg/dL (ref 8.4–10.5)
Chloride: 104 mEq/L (ref 96–112)
Creatinine, Ser: 1.37 mg/dL (ref 0.40–1.50)
GFR: 60.23 mL/min (ref >60.00)
Glucose, Bld: 104 mg/dL — ABNORMAL HIGH (ref 70–99)
Potassium: 3.3 mEq/L — ABNORMAL LOW (ref 3.5–5.1)
Sodium: 140 mEq/L (ref 135–145)

## 2021-01-06 LAB — MAGNESIUM: Magnesium: 1.8 mg/dL (ref 1.5–2.5)

## 2021-01-06 MED ORDER — TRIAMTERENE-HCTZ 75-50 MG PO TABS: 1.0000 | ORAL_TABLET | Freq: Every day | ORAL | 1 refills | Status: DC

## 2021-01-06 MED ORDER — METOPROLOL SUCCINATE ER 50 MG PO TB24: ORAL_TABLET | ORAL | 1 refills | Status: DC

## 2021-01-06 MED ORDER — DILTIAZEM HCL ER COATED BEADS 300 MG PO CP24: 300.0000 mg | ORAL_CAPSULE | Freq: Every day | ORAL | 3 refills | Status: DC

## 2021-01-06 MED ORDER — POTASSIUM CHLORIDE CRYS ER 20 MEQ PO TBCR: 20.0000 meq | EXTENDED_RELEASE_TABLET | Freq: Every day | ORAL | 3 refills | Status: DC

## 2021-01-06 NOTE — Progress Notes (Signed)
k  Established Patient Office Visit  Subjective:  Patient ID: Nicholas Hughes, male    DOB: 03-12-1970  Age: 50 y.o. MRN: [id number]  CC:  Chief Complaint  Patient presents with   Follow-up    2 month follow up, no concerns.     HPI [name] presents for physical exam follow-up of blood pressure, prediabetes and hypokalemia.  He has been exercising and able to lose 4 pounds.  Determined to prevent diabetes.  Follow-up with cardiology as scheduled later this month.    Past Medical History:  Diagnosis Date   Borderline diabetes    CKD (chronic kidney disease) stage 2, GFR 60-89 ml/min    ED (erectile dysfunction)    GERD (gastroesophageal reflux disease)    Hypertension    LVH (left ventricular hypertrophy)    OSA (obstructive sleep apnea) 07/14/2014   Moderate OSA with AHI 20/hr   Renal insufficiency    Transient atrial fibrillation or flutter     Past Surgical History:  Procedure Laterality Date   TRANSTHORACIC ECHOCARDIOGRAM  05/07/2009   EF 60-65%    Family History  Problem Relation Age of Onset   Hypertension Father    Coronary artery disease Father     Social History   Socioeconomic History   Marital status: Single    Spouse name: Not on file   Number of children: Not on file   Years of education: Not on file   Highest education level: Not on file  Occupational History   Not on file  Tobacco Use   Smoking status: Never   Smokeless tobacco: Never  Vaping Use   Vaping Use: Never used  Substance and Sexual Activity   Alcohol use: Not Currently    Comment: occasionally   Drug use: No   Sexual activity: Yes  Other Topics Concern   Not on file  Social History Narrative   Right handed   One story home   Drinks no caffeine   Social Determinants of Health   Financial Resource Strain: Not on file  Food Insecurity: Not on file  Transportation Needs: Not on file  Physical Activity: Not on file  Stress: Not on file  Social Connections: Not on  file  Intimate Partner Violence: Not on file    Outpatient Medications Prior to Visit  Medication Sig Dispense Refill   nitroGLYCERIN (NITRODUR - DOSED IN MG/24 HR) 0.2 mg/hr patch Place 1/4 to 1/2 of a patch over affected region. Remove and replace once daily.  Slightly alter skin placement daily 30 patch 1   diltiazem (CARTIA XT) 300 MG 24 hr capsule Take 1 capsule (300 mg total) by mouth daily. 90 capsule 3   metoprolol succinate (TOPROL-XL) 50 MG 24 hr tablet TAKE 1 TABLET BY MOUTH ONCE DAILY ---TAKE  WITH  OR  IMMEDIATELY  FOLLOWING  A  MEAL 30 tablet 1   potassium chloride SA (KLOR-CON) 20 MEQ tablet Take 1 tablet (20 mEq total) by mouth daily. 30 tablet 3   triamterene-hydrochlorothiazide (MAXZIDE) 75-50 MG tablet Take 1 tablet by mouth daily. 90 tablet 1   No facility-administered medications prior to visit.    No Known Allergies  ROS Review of Systems  Constitutional:  Negative for diaphoresis, fatigue, fever and unexpected weight change.  HENT: Negative.    Eyes:  Negative for photophobia and visual disturbance.  Respiratory: Negative.    Cardiovascular:  Negative for chest pain and palpitations.  Gastrointestinal: Negative.  Negative for anal bleeding, blood in stool  and constipation.  Genitourinary:  Negative for difficulty urinating, frequency and urgency.  Musculoskeletal:  Negative for gait problem and joint swelling.  Skin:  Negative for pallor and rash.  Neurological:  Negative for speech difficulty and weakness.  Psychiatric/Behavioral: Negative.       Objective:    Physical Exam Vitals and nursing note reviewed.  Constitutional:      General: He is not in acute distress.    Appearance: Normal appearance. He is not ill-appearing, toxic-appearing or diaphoretic.  HENT:     Head: Normocephalic and atraumatic.     Right Ear: Tympanic membrane, ear canal and external ear normal.     Left Ear: Tympanic membrane, ear canal and external ear normal.      Mouth/Throat:     Mouth: Mucous membranes are moist.     Pharynx: Oropharynx is clear. No oropharyngeal exudate or posterior oropharyngeal erythema.  Cardiovascular:     Rate and Rhythm: Normal rate and regular rhythm.  Pulmonary:     Effort: Pulmonary effort is normal.     Breath sounds: Normal breath sounds.  Abdominal:     General: Bowel sounds are normal.     Hernia: There is no hernia in the left inguinal area or right inguinal area.  Genitourinary:    Penis: Circumcised. No hypospadias, erythema, tenderness, discharge, swelling or lesions.      Testes:        Right: Mass, tenderness or swelling not present. Right testis is descended.        Left: Mass, tenderness or swelling not present. Left testis is descended.     Epididymis:     Right: Not inflamed or enlarged.     Left: Not inflamed or enlarged.     Prostate: Not enlarged, not tender and no nodules present.     Rectum: Guaiac result negative. No mass, tenderness, anal fissure, external hemorrhoid or internal hemorrhoid. Normal anal tone.  Musculoskeletal:     Cervical back: No rigidity or tenderness.  Lymphadenopathy:     Cervical: No cervical adenopathy.     Lower Body: No right inguinal adenopathy. No left inguinal adenopathy.  Skin:    General: Skin is warm and dry.  Neurological:     Mental Status: He is alert and oriented to person, place, and time.  Psychiatric:        Mood and Affect: Mood normal.        Behavior: Behavior normal.    BP (!) 142/92 (BP Location: Left Arm, Patient Position: Sitting, Cuff Size: Large)   Pulse 78   Temp 97.7 F (36.5 C) (Temporal)   Ht 6' (1.829 m)   Wt 269 lb (122 kg)   SpO2 97%   BMI 36.48 kg/m  Wt Readings from Last 3 Encounters:  01/06/21 269 lb (122 kg)  12/18/20 273 lb 9.6 oz (124.1 kg)  11/06/20 270 lb (122.5 kg)     Health Maintenance Due  Topic Date Due   HIV Screening  Never done   Hepatitis C Screening  Never done   COLONOSCOPY (Pts 45-33yrs Insurance  coverage will need to be confirmed)  Never done   Zoster Vaccines- Shingrix (1 of 2) Never done    There are no preventive care reminders to display for this patient.  Lab Results  Component Value Date   TSH 0.77 02/14/2019   Lab Results  Component Value Date   WBC 5.5 11/04/2020   HGB 13.9 11/04/2020   HCT 41.1 11/04/2020  MCV 86.0 11/04/2020   PLT 290.0 11/04/2020   Lab Results  Component Value Date   NA 138 11/04/2020   K 3.3 (L) 11/04/2020   CO2 28 11/04/2020   GLUCOSE 92 11/04/2020   BUN 16 11/04/2020   CREATININE 1.32 11/04/2020   BILITOT 0.3 11/04/2020   ALKPHOS 58 11/04/2020   AST 22 11/04/2020   ALT 21 11/04/2020   PROT 7.5 11/04/2020   ALBUMIN 4.1 11/04/2020   CALCIUM 9.6 11/04/2020   ANIONGAP 8 02/04/2019   GFR 63.06 11/04/2020   Lab Results  Component Value Date   CHOL 174 11/04/2020   Lab Results  Component Value Date   HDL 51.40 11/04/2020   Lab Results  Component Value Date   LDLCALC 97 11/04/2020   Lab Results  Component Value Date   TRIG 129.0 11/04/2020   Lab Results  Component Value Date   CHOLHDL 3 11/04/2020   Lab Results  Component Value Date   HGBA1C 6.5 11/04/2020      Assessment & Plan:   Problem List Items Addressed This Visit       Cardiovascular and Mediastinum   Essential hypertension   Relevant Medications   diltiazem (CARTIA XT) 300 MG 24 hr capsule   metoprolol succinate (TOPROL-XL) 50 MG 24 hr tablet   triamterene-hydrochlorothiazide (MAXZIDE) 75-50 MG tablet   Atrial fibrillation, transient (HCC)   Relevant Medications   diltiazem (CARTIA XT) 300 MG 24 hr capsule   metoprolol succinate (TOPROL-XL) 50 MG 24 hr tablet   triamterene-hydrochlorothiazide (MAXZIDE) 75-50 MG tablet   Other Visit Diagnoses     Healthcare maintenance    -  Primary   Relevant Orders   Ambulatory referral to Gastroenterology   Hypokalemia       Relevant Medications   potassium chloride SA (KLOR-CON M) 20 MEQ tablet   Other  Relevant Orders   Basic metabolic panel   Magnesium       Meds ordered this encounter  Medications   diltiazem (CARTIA XT) 300 MG 24 hr capsule    Sig: Take 1 capsule (300 mg total) by mouth daily.    Dispense:  90 capsule    Refill:  3   metoprolol succinate (TOPROL-XL) 50 MG 24 hr tablet    Sig: TAKE 1 TABLET BY MOUTH ONCE DAILY ---TAKE  WITH  OR  IMMEDIATELY  FOLLOWING  A  MEAL Strength: 50 mg    Dispense:  90 tablet    Refill:  1   potassium chloride SA (KLOR-CON M) 20 MEQ tablet    Sig: Take 1 tablet (20 mEq total) by mouth daily.    Dispense:  30 tablet    Refill:  3   triamterene-hydrochlorothiazide (MAXZIDE) 75-50 MG tablet    Sig: Take 1 tablet by mouth daily.    Dispense:  90 tablet    Refill:  1    Follow-up: Return in about 6 months (around 07/07/2021), or Continue weight loss and exercising..   Information was given on health maintenance disease prevention as well as  colonoscopy. Libby Maw, MD

## 2021-01-15 ENCOUNTER — Ambulatory Visit (INDEPENDENT_AMBULATORY_CARE_PROVIDER_SITE_OTHER): Payer: BC Managed Care – PPO | Admitting: Family Medicine

## 2021-01-15 ENCOUNTER — Other Ambulatory Visit: Payer: Self-pay

## 2021-01-15 VITALS — BP 130/90 | HR 76 | Ht 72.0 in | Wt 270.0 lb

## 2021-01-15 DIAGNOSIS — M778 Other enthesopathies, not elsewhere classified: Secondary | ICD-10-CM | POA: Diagnosis not present

## 2021-01-15 NOTE — Progress Notes (Signed)
° °  I, Philbert Riser, LAT, ATC acting as a scribe for Nicholas Graham, MD.  Nicholas Hughes is a 50 y.o. male who presents to [facility] today for f/u L triceps tendonopathy and R knee pain. Pt struck his L elbow on a pallet on 05/28/20 when jumping down from another stack of pallets. Pt was last seen by Dr. Denyse Amass on 12/18/20 and was given a R knee steroid injection, advised to use Voltaren gel, knee compression sleeve, and was taught a HEP and prescribed nitro patches. Today, pt reports that his knee is doing a lot better since the injections. Patient states that the swelling has gone down a lot in the left elbow and he can now feel a difference. Patient is able to exercise, but can still feels some pain.   Dx imaging: 12/18/20 R knee XR  Pertinent review of systems: no fever or chills  Relevant historical information: HTN   Exam:  BP 130/90    Pulse 76    Ht 6' (1.829 m)    Wt 270 lb (122.5 kg)    SpO2 96%    BMI 36.62 kg/m  General: Well Developed, well nourished, and in no acute distress.   MSK: Left triceps normal. Mildly tender to palpation with mild nodule at distal left triceps.  Intact strength to elbow extension with only minimal pain.   Assessment and Plan: 50 y.o. male with left triceps tendon injury at insertion onto the elbow.  Significantly improved with eccentric exercises and nitroglycerin patch protocol.  Plan to continue the nitroglycerin patch protocol and exercise further 2 more months.  Recheck in 2 months or sooner if needed.  Total encounter time 20 minutes including face-to-face time with the patient and, reviewing past medical record, and charting on the date of service.   Treatment plan and options  Discussed warning signs or symptoms. Please see discharge instructions. Patient expresses understanding.   The above documentation has been reviewed and is accurate and complete Nicholas Hughes, M.D.

## 2021-01-15 NOTE — Patient Instructions (Addendum)
Good to see you  Keep up the exercises and nitro patches Follow up  in 2 months

## 2021-02-05 ENCOUNTER — Other Ambulatory Visit: Payer: Self-pay | Admitting: Cardiovascular Disease

## 2021-02-05 DIAGNOSIS — I10 Essential (primary) hypertension: Secondary | ICD-10-CM

## 2021-02-25 ENCOUNTER — Other Ambulatory Visit: Payer: Self-pay | Admitting: Family Medicine

## 2021-02-25 DIAGNOSIS — I10 Essential (primary) hypertension: Secondary | ICD-10-CM

## 2021-04-15 ENCOUNTER — Other Ambulatory Visit: Payer: Self-pay

## 2021-04-15 ENCOUNTER — Encounter: Payer: Self-pay | Admitting: Family Medicine

## 2021-04-15 ENCOUNTER — Ambulatory Visit (INDEPENDENT_AMBULATORY_CARE_PROVIDER_SITE_OTHER): Payer: BC Managed Care – PPO | Admitting: Family Medicine

## 2021-04-15 VITALS — BP 150/86 | HR 63 | Temp 98.1°F | Ht 72.0 in | Wt 260.4 lb

## 2021-04-15 DIAGNOSIS — I10 Essential (primary) hypertension: Secondary | ICD-10-CM

## 2021-04-15 DIAGNOSIS — E876 Hypokalemia: Secondary | ICD-10-CM | POA: Diagnosis not present

## 2021-04-15 DIAGNOSIS — R7309 Other abnormal glucose: Secondary | ICD-10-CM | POA: Diagnosis not present

## 2021-04-15 NOTE — Progress Notes (Addendum)
? ?Established Patient Office Visit ? ?Subjective:  ?Patient ID: Nicholas Hughes, male    DOB: May 13, 1970  Age: 51 y.o. MRN: [id number] ? ?CC:  ?Chief Complaint  ?Patient presents with  ? Follow-up  ?  3 month follow up on blood pressure and labs.  ? ? ?HPI ?Nicholas Hughes presents for follow-up of hypertension with history of PAF and prediabetes.  Has been able to lose some more weight.  Blood pressure at home has been running in the 130s over 80s.  Continues with potassium supplementation. ? ?Past Medical History:  ?Diagnosis Date  ? Borderline diabetes   ? CKD (chronic kidney disease) stage 2, GFR 60-89 ml/min   ? ED (erectile dysfunction)   ? GERD (gastroesophageal reflux disease)   ? Hypertension   ? LVH (left ventricular hypertrophy)   ? OSA (obstructive sleep apnea) 07/14/2014  ? Moderate OSA with AHI 20/hr  ? Renal insufficiency   ? Transient atrial fibrillation or flutter   ? ? ?Past Surgical History:  ?Procedure Laterality Date  ? TRANSTHORACIC ECHOCARDIOGRAM  05/07/2009  ? EF 60-65%  ? ? ?Family History  ?Problem Relation Age of Onset  ? Hypertension Father   ? Coronary artery disease Father   ? ? ?Social History  ? ?Socioeconomic History  ? Marital status: Single  ?  Spouse name: Not on file  ? Number of children: Not on file  ? Years of education: Not on file  ? Highest education level: Not on file  ?Occupational History  ? Not on file  ?Tobacco Use  ? Smoking status: Never  ? Smokeless tobacco: Never  ?Vaping Use  ? Vaping Use: Never used  ?Substance and Sexual Activity  ? Alcohol use: Not Currently  ?  Comment: occasionally  ? Drug use: No  ? Sexual activity: Yes  ?Other Topics Concern  ? Not on file  ?Social History Narrative  ? Right handed  ? One story home  ? Drinks no caffeine  ? ?Social Determinants of Health  ? ?Financial Resource Strain: Not on file  ?Food Insecurity: Not on file  ?Transportation Needs: Not on file  ?Physical Activity: Not on file  ?Stress: Not on file  ?Social Connections: Not  on file  ?Intimate Partner Violence: Not on file  ? ? ?Outpatient Medications Prior to Visit  ?Medication Sig Dispense Refill  ? diltiazem (CARTIA XT) 300 MG 24 hr capsule Take 1 capsule (300 mg total) by mouth daily. 90 capsule 3  ? metoprolol succinate (TOPROL-XL) 50 MG 24 hr tablet TAKE 1 TABLET BY MOUTH ONCE DAILY --TAKE  WITH  OR  IMMEDIATELY  FOLLOWING  A  MEAL 90 tablet 0  ? nitroGLYCERIN (NITRODUR - DOSED IN MG/24 HR) 0.2 mg/hr patch Place 1/4 to 1/2 of a patch over affected region. Remove and replace once daily.  Slightly alter skin placement daily 30 patch 1  ? potassium chloride SA (KLOR-CON M) 20 MEQ tablet Take 1 tablet (20 mEq total) by mouth daily. 30 tablet 3  ? triamterene-hydrochlorothiazide (MAXZIDE) 75-50 MG tablet Take 1 tablet by mouth daily. 90 tablet 1  ? carvedilol (COREG) 6.25 MG tablet Take 1 tablet (6.25 mg total) by mouth 2 (two) times daily. (Patient not taking: Reported on 02/14/2019) 180 tablet 3  ? ?No facility-administered medications prior to visit.  ? ? ?No Known Allergies ? ?ROS ?Review of Systems  ?Constitutional:  Negative for chills, diaphoresis, fatigue, fever and unexpected weight change.  ?HENT: Negative.    ?Respiratory: Negative.    ?  Cardiovascular: Negative.  Negative for palpitations.  ?Gastrointestinal: Negative.   ?Endocrine: Negative for polyphagia and polyuria.  ?Genitourinary: Negative.   ?Musculoskeletal:  Negative for gait problem and joint swelling.  ?Neurological:  Negative for speech difficulty.  ?Psychiatric/Behavioral: Negative.    ? ?  ?Objective:  ?  ?Physical Exam ?Vitals and nursing note reviewed.  ?Constitutional:   ?   General: He is not in acute distress. ?   Appearance: Normal appearance. He is not ill-appearing, toxic-appearing or diaphoretic.  ?HENT:  ?   Head: Normocephalic and atraumatic.  ?   Right Ear: External ear normal.  ?   Left Ear: External ear normal.  ?Eyes:  ?   General: No scleral icterus.    ?   Right eye: No discharge.     ?   Left  eye: No discharge.  ?   Extraocular Movements: Extraocular movements intact.  ?   Conjunctiva/sclera: Conjunctivae normal.  ?   Pupils: Pupils are equal, round, and reactive to light.  ?Cardiovascular:  ?   Rate and Rhythm: Normal rate and regular rhythm.  ?Pulmonary:  ?   Effort: Pulmonary effort is normal.  ?   Breath sounds: Normal breath sounds.  ?Musculoskeletal:  ?   Cervical back: No rigidity or tenderness.  ?Lymphadenopathy:  ?   Cervical: No cervical adenopathy.  ?Skin: ?   General: Skin is warm and dry.  ?Neurological:  ?   Mental Status: He is alert and oriented to person, place, and time.  ?Psychiatric:     ?   Mood and Affect: Mood normal.     ?   Behavior: Behavior normal.  ? ? ?BP (!) 150/86 (BP Location: Left Arm, Patient Position: Sitting, Cuff Size: Normal)   Pulse 63   Temp 98.1 ?F (36.7 ?C) (Temporal)   Ht 6' (1.829 m)   Wt 260 lb 6.4 oz (118.1 kg)   SpO2 96%   BMI 35.32 kg/m?  ?Wt Readings from Last 3 Encounters:  ?04/15/21 260 lb 6.4 oz (118.1 kg)  ?01/15/21 270 lb (122.5 kg)  ?01/06/21 269 lb (122 kg)  ? ? ? ?Health Maintenance Due  ?Topic Date Due  ? HIV Screening  Never done  ? Hepatitis C Screening  Never done  ? ? ?There are no preventive care reminders to display for this patient. ? ?Lab Results  ?Component Value Date  ? TSH 0.77 02/14/2019  ? ?Lab Results  ?Component Value Date  ? WBC 5.5 11/04/2020  ? HGB 13.9 11/04/2020  ? HCT 41.1 11/04/2020  ? MCV 86.0 11/04/2020  ? PLT 290.0 11/04/2020  ? ?Lab Results  ?Component Value Date  ? NA 138 04/15/2021  ? K 3.8 04/15/2021  ? CO2 28 04/15/2021  ? GLUCOSE 84 04/15/2021  ? BUN 21 04/15/2021  ? CREATININE 1.32 04/15/2021  ? BILITOT 0.3 04/15/2021  ? ALKPHOS 58 04/15/2021  ? AST 24 04/15/2021  ? ALT 21 04/15/2021  ? PROT 7.8 04/15/2021  ? ALBUMIN 4.3 04/15/2021  ? CALCIUM 9.6 04/15/2021  ? ANIONGAP 8 02/04/2019  ? GFR 62.86 04/15/2021  ? ?Lab Results  ?Component Value Date  ? CHOL 174 11/04/2020  ? ?Lab Results  ?Component Value Date  ?  HDL 51.40 11/04/2020  ? ?Lab Results  ?Component Value Date  ? LDLCALC 97 11/04/2020  ? ?Lab Results  ?Component Value Date  ? TRIG 129.0 11/04/2020  ? ?Lab Results  ?Component Value Date  ? CHOLHDL 3 11/04/2020  ? ?Lab Results  ?  Component Value Date  ? HGBA1C 6.5 04/15/2021  ? ? ?  ?Assessment & Plan:  ? ?Problem List Items Addressed This Visit   ? ?  ? Cardiovascular and Mediastinum  ? Essential hypertension - Primary  ? Relevant Orders  ? Comprehensive metabolic panel (Completed)  ?  ? Other  ? Elevated glucose  ? Relevant Medications  ? empagliflozin (JARDIANCE) 10 MG TABS tablet  ? Other Relevant Orders  ? Comprehensive metabolic panel (Completed)  ? Hemoglobin A1c (Completed)  ? ?Other Visit Diagnoses   ? ? Hypokalemia      ? Relevant Orders  ? Comprehensive metabolic panel (Completed)  ? ?  ? ? ?Meds ordered this encounter  ?Medications  ? empagliflozin (JARDIANCE) 10 MG TABS tablet  ?  Sig: Take 1 tablet (10 mg total) by mouth daily before breakfast.  ?  Dispense:  30 tablet  ?  Refill:  5  ? ? ?Follow-up: Return in about 3 months (around 07/16/2021).  ?Continue all medicines as above.  Also follow-up with cardiology on April 24.  Continue weight loss journey and exercise. ? ?Mliss Sax, MD ?

## 2021-04-16 LAB — COMPREHENSIVE METABOLIC PANEL
ALT: 21 U/L (ref 0–53)
AST: 24 U/L (ref 0–37)
Albumin: 4.3 g/dL (ref 3.5–5.2)
Alkaline Phosphatase: 58 U/L (ref 39–117)
BUN: 21 mg/dL (ref 6–23)
CO2: 28 mEq/L (ref 19–32)
Calcium: 9.6 mg/dL (ref 8.4–10.5)
Chloride: 102 mEq/L (ref 96–112)
Creatinine, Ser: 1.32 mg/dL (ref 0.40–1.50)
GFR: 62.86 mL/min (ref >60.00)
Glucose, Bld: 84 mg/dL (ref 70–99)
Potassium: 3.8 mEq/L (ref 3.5–5.1)
Sodium: 138 mEq/L (ref 135–145)
Total Bilirubin: 0.3 mg/dL (ref 0.2–1.2)
Total Protein: 7.8 g/dL (ref 6.0–8.3)

## 2021-04-16 LAB — HEMOGLOBIN A1C: Hgb A1c MFr Bld: 6.5 % (ref 4.6–6.5)

## 2021-04-17 MED ORDER — EMPAGLIFLOZIN 10 MG PO TABS: 10.0000 mg | ORAL_TABLET | Freq: Every day | ORAL | 5 refills | Status: DC

## 2021-04-17 NOTE — Addendum Note (Signed)
Addended by: Andrez Grime on: 04/17/2021 11:48 AM ? ? Modules accepted: Orders ? ?

## 2021-04-21 ENCOUNTER — Telehealth (INDEPENDENT_AMBULATORY_CARE_PROVIDER_SITE_OTHER): Payer: BC Managed Care – PPO | Admitting: Family Medicine

## 2021-04-21 ENCOUNTER — Encounter: Payer: Self-pay | Admitting: Family Medicine

## 2021-04-21 VITALS — Ht 73.0 in

## 2021-04-21 DIAGNOSIS — I10 Essential (primary) hypertension: Secondary | ICD-10-CM

## 2021-04-21 DIAGNOSIS — R7303 Prediabetes: Secondary | ICD-10-CM

## 2021-04-21 DIAGNOSIS — E876 Hypokalemia: Secondary | ICD-10-CM

## 2021-04-21 NOTE — Progress Notes (Signed)
? ?Established Patient Office Visit ? ?Subjective:  ?Patient ID: Nicholas Hughes, male    DOB: 1970-12-31  Age: 51 y.o. MRN: [id number] ? ?CC:  ?Chief Complaint  ?Patient presents with  ? Advice Only  ?  Discuss labs.   ? ? ?HPI ?Nicholas Hughes presents for follow-up of CKD stage II and prediabetes with high blood pressure.  Continues low-sodium diet except maybe on the weekends.  Blood pressure at home tends to run in the 130s over upper 80s.  Has started empagliflozin and is tolerating it well. ? ?Past Medical History:  ?Diagnosis Date  ? Borderline diabetes   ? CKD (chronic kidney disease) stage 2, GFR 60-89 ml/min   ? ED (erectile dysfunction)   ? GERD (gastroesophageal reflux disease)   ? Hypertension   ? LVH (left ventricular hypertrophy)   ? OSA (obstructive sleep apnea) 07/14/2014  ? Moderate OSA with AHI 20/hr  ? Renal insufficiency   ? Transient atrial fibrillation or flutter   ? ? ?Past Surgical History:  ?Procedure Laterality Date  ? TRANSTHORACIC ECHOCARDIOGRAM  05/07/2009  ? EF 60-65%  ? ? ?Family History  ?Problem Relation Age of Onset  ? Hypertension Father   ? Coronary artery disease Father   ? ? ?Social History  ? ?Socioeconomic History  ? Marital status: Single  ?  Spouse name: Not on file  ? Number of children: Not on file  ? Years of education: Not on file  ? Highest education level: Not on file  ?Occupational History  ? Not on file  ?Tobacco Use  ? Smoking status: Never  ? Smokeless tobacco: Never  ?Vaping Use  ? Vaping Use: Never used  ?Substance and Sexual Activity  ? Alcohol use: Not Currently  ?  Comment: occasionally  ? Drug use: No  ? Sexual activity: Yes  ?Other Topics Concern  ? Not on file  ?Social History Narrative  ? Right handed  ? One story home  ? Drinks no caffeine  ? ?Social Determinants of Health  ? ?Financial Resource Strain: Not on file  ?Food Insecurity: Not on file  ?Transportation Needs: Not on file  ?Physical Activity: Not on file  ?Stress: Not on file  ?Social  Connections: Not on file  ?Intimate Partner Violence: Not on file  ? ? ?Outpatient Medications Prior to Visit  ?Medication Sig Dispense Refill  ? diltiazem (CARTIA XT) 300 MG 24 hr capsule Take 1 capsule (300 mg total) by mouth daily. 90 capsule 3  ? empagliflozin (JARDIANCE) 10 MG TABS tablet Take 1 tablet (10 mg total) by mouth daily before breakfast. 30 tablet 5  ? metoprolol succinate (TOPROL-XL) 50 MG 24 hr tablet TAKE 1 TABLET BY MOUTH ONCE DAILY --TAKE  WITH  OR  IMMEDIATELY  FOLLOWING  A  MEAL 90 tablet 0  ? nitroGLYCERIN (NITRODUR - DOSED IN MG/24 HR) 0.2 mg/hr patch Place 1/4 to 1/2 of a patch over affected region. Remove and replace once daily.  Slightly alter skin placement daily 30 patch 1  ? potassium chloride SA (KLOR-CON M) 20 MEQ tablet Take 1 tablet (20 mEq total) by mouth daily. 30 tablet 3  ? triamterene-hydrochlorothiazide (MAXZIDE) 75-50 MG tablet Take 1 tablet by mouth daily. 90 tablet 1  ? ?No facility-administered medications prior to visit.  ? ? ?No Known Allergies ? ?ROS ?Review of Systems  ?Constitutional: Negative.   ?Respiratory: Negative.    ?Cardiovascular: Negative.   ?Gastrointestinal: Negative.   ?Genitourinary: Negative.   ? ?  ?  Objective:  ?  ?Physical Exam ?Vitals and nursing note reviewed.  ?Constitutional:   ?   Appearance: Normal appearance.  ?Eyes:  ?   General: No scleral icterus.    ?   Right eye: No discharge.     ?   Left eye: No discharge.  ?   Conjunctiva/sclera: Conjunctivae normal.  ?Pulmonary:  ?   Effort: Pulmonary effort is normal.  ?Neurological:  ?   Mental Status: He is alert and oriented to person, place, and time.  ?Psychiatric:     ?   Mood and Affect: Mood normal.     ?   Behavior: Behavior normal.  ? ? ?Ht 6\' 1"  (1.854 m)   BMI 34.36 kg/m?  ?Wt Readings from Last 3 Encounters:  ?04/15/21 260 lb 6.4 oz (118.1 kg)  ?01/15/21 270 lb (122.5 kg)  ?01/06/21 269 lb (122 kg)  ? ? ? ?Health Maintenance Due  ?Topic Date Due  ? HIV Screening  Never done  ? Hepatitis  C Screening  Never done  ? ? ?There are no preventive care reminders to display for this patient. ? ?Lab Results  ?Component Value Date  ? TSH 0.77 02/14/2019  ? ?Lab Results  ?Component Value Date  ? WBC 5.5 11/04/2020  ? HGB 13.9 11/04/2020  ? HCT 41.1 11/04/2020  ? MCV 86.0 11/04/2020  ? PLT 290.0 11/04/2020  ? ?Lab Results  ?Component Value Date  ? NA 138 04/15/2021  ? K 3.8 04/15/2021  ? CO2 28 04/15/2021  ? GLUCOSE 84 04/15/2021  ? BUN 21 04/15/2021  ? CREATININE 1.32 04/15/2021  ? BILITOT 0.3 04/15/2021  ? ALKPHOS 58 04/15/2021  ? AST 24 04/15/2021  ? ALT 21 04/15/2021  ? PROT 7.8 04/15/2021  ? ALBUMIN 4.3 04/15/2021  ? CALCIUM 9.6 04/15/2021  ? ANIONGAP 8 02/04/2019  ? GFR 62.86 04/15/2021  ? ?Lab Results  ?Component Value Date  ? CHOL 174 11/04/2020  ? ?Lab Results  ?Component Value Date  ? HDL 51.40 11/04/2020  ? ?Lab Results  ?Component Value Date  ? LDLCALC 97 11/04/2020  ? ?Lab Results  ?Component Value Date  ? TRIG 129.0 11/04/2020  ? ?Lab Results  ?Component Value Date  ? CHOLHDL 3 11/04/2020  ? ?Lab Results  ?Component Value Date  ? HGBA1C 6.5 04/15/2021  ? ? ?  ?Assessment & Plan:  ? ?Problem List Items Addressed This Visit   ? ?  ? Cardiovascular and Mediastinum  ? Essential hypertension - Primary  ? ?Other Visit Diagnoses   ? ? Hypokalemia      ? Pre-diabetes      ? ?  ? ? ?No orders of the defined types were placed in this encounter. ? ? ?Follow-up: Return in about 3 months (around 07/22/2021).  ?Continue all medicines as above.  Has follow-up with cardiology in a few weeks.  We will appreciate their help with his blood pressure.  He will continue potassium supplementation.  Follow-up with me in 3 months. ? ?Mliss SaxWilliam Alfred Ival Pacer, MD ? ? ?Virtual Visit via Video Note ? ?I connected with Nicholas Hughes on 04/21/21 at  3:20 PM EDT by a video enabled telemedicine application and verified that I am speaking with the correct person using two identifiers. ? ?Location: ?Patient: home alone.  ?Provider:  work ?  ?I discussed the limitations of evaluation and management by telemedicine and the availability of in person appointments. The patient expressed understanding and agreed to proceed. ? ?History of Present  Illness: ? ?  ?Observations/Objective: ? ? ?Assessment and Plan: ? ? ?Follow Up Instructions: ? ?  ?I discussed the assessment and treatment plan with the patient. The patient was provided an opportunity to ask questions and all were answered. The patient agreed with the plan and demonstrated an understanding of the instructions. ?  ?The patient was advised to call back or seek an in-person evaluation if the symptoms worsen or if the condition fails to improve as anticipated. ? ?I provided 20 minutes of non-face-to-face time during this encounter. ? ? ?Mliss Sax, MD  ?

## 2021-05-19 ENCOUNTER — Ambulatory Visit (INDEPENDENT_AMBULATORY_CARE_PROVIDER_SITE_OTHER): Payer: BC Managed Care – PPO | Admitting: Cardiovascular Disease

## 2021-05-19 ENCOUNTER — Encounter: Payer: Self-pay | Admitting: Cardiovascular Disease

## 2021-05-19 VITALS — BP 140/78 | HR 65 | Ht 73.0 in | Wt 254.2 lb

## 2021-05-19 DIAGNOSIS — I10 Essential (primary) hypertension: Secondary | ICD-10-CM | POA: Diagnosis not present

## 2021-05-19 DIAGNOSIS — I517 Cardiomegaly: Secondary | ICD-10-CM | POA: Diagnosis not present

## 2021-05-19 NOTE — Patient Instructions (Signed)
Medication Instructions:  ?Your physician recommends that you continue on your current medications as directed. Please refer to the Current Medication list given to you today. ? ?*If you need a refill on your cardiac medications before your next appointment, please call your pharmacy* ? ? ?Lab Work: ?NONE ?If you have labs (blood work) drawn today and your tests are completely normal, you will receive your results only by: ?MyChart Message (if you have MyChart) OR ?A paper copy in the mail ?If you have any lab test that is abnormal or we need to change your treatment, we will call you to review the results. ? ? ?Testing/Procedures: ?NONE ? ? ?Follow-Up: ?At [facility], you and your health needs are our priority.  As part of our continuing mission to provide you with exceptional heart care, we have created designated Provider Care Teams.  These Care Teams include your primary Cardiologist (physician) and Advanced Practice Providers (APPs -  Physician Assistants and Nurse Practitioners) who all work together to provide you with the care you need, when you need it. ? ?Your next appointment:   ?1 year(s) ? ?The format for your next appointment:   ?In Person ? ?Provider:   ?Philip Nahser, MD  or Vin Bhagat, PA-C, Michelle Swinyer, NP, or Scott Weaver, PA-C      ? ?  ? ?Important Information About Sugar ? ? ? ? ?  ?

## 2021-05-19 NOTE — Progress Notes (Signed)
? ?Cardiology Office Note ? ? ?Date:  05/19/2021  ? ?IDJaviar Hughes, DOB 02/10/1970, MRN [id number] ? ?PCP:  Libby Maw, MD  ?Cardiologist:   Mertie Moores, MD  ? ?Chief Complaint  ?Patient presents with  ? Hypertension  ?   ?  ? ?Problem list: ?1. Hypertension ?2. Left ventricular Hypertrophy ?3. Transient atrial fibrillation ?  ? ?Nicholas Hughes is a 51 y.o. male who presents for follow up of his HTN. ?He has not been having any CP or dyspnea. ?Eating better , does eat chicken wings every other Wednesday.  ? ?We performed an echo card gram back in 2011 which showed normal left ventricular systolic function. Mild mitral regurgitation. ? ?No palpitations. ?Works for [employer] ( now has changed name to [employer] )  ? ?July 27, 2014:  ? ?Doing well . ?Reduced sex drive ? ?Sept. 14, 2016: ?Doing well ?BP is a bit high. He's been working out on a regular basis. He still eats some extra salt ([place] bacon)  ? ?April 03, 2015: ?Still working at [employer] and also works in the [employer].   ?Has been overeating recently .    ?Has lost 10 labs since last year.  ?Has cut out his salt.   Eating lots of fruits.   Drinking protein shakes ( as a snack, not as a meal )  ?Is still trying to get his CPAP machine  ? ?March 27, 2016: ? ?Doing well  ?BP has been well controlled  ?Eating better. Exercising well.  ? ? ?June 30, 2016: ? ?Doing well ?Work is going well ,  BP has been well controlled ?Does not have a PCP  ? ?November 01, 2017: ?Doing well. ?Has not been to sleep yet today - working 2 jobs - his main job is moving to [place].  ?BP is a bit elevated today  ?Not working out as much . ?BP has been ok  ? ?Nov. 23, 2021 ?Charon is seen today for follow up.  Hx of HTN. OSA ,  and PAF  ?Wants a referal to GI  ?No cp , no dyspnea.  ?Has started back exercising  ?Works at [employer]  ?Has tried to limit his carbs  ?Has been working with a Physiological scientist.  ? ?May 19, 2021: ?Nicholas Hughes is  seen today for follow-up visit.  He has history of hypertension, obstructive sleep apnea, paroxysmal atrial fibrillation. ?No CP or dyspnea  ?Is getting some exercise  ?Works at [employer]  ? ? ?Past Medical History:  ?Diagnosis Date  ? Borderline diabetes   ? CKD (chronic kidney disease) stage 2, GFR 60-89 ml/min   ? ED (erectile dysfunction)   ? GERD (gastroesophageal reflux disease)   ? Hypertension   ? LVH (left ventricular hypertrophy)   ? OSA (obstructive sleep apnea) 07/14/2014  ? Moderate OSA with AHI 20/hr  ? Renal insufficiency   ? Transient atrial fibrillation or flutter   ? ? ?Past Surgical History:  ?Procedure Laterality Date  ? TRANSTHORACIC ECHOCARDIOGRAM  05/07/2009  ? EF 60-65%  ? ? ? ?Current Outpatient Medications  ?Medication Sig Dispense Refill  ? diltiazem (CARTIA XT) 300 MG 24 hr capsule Take 1 capsule (300 mg total) by mouth daily. 90 capsule 3  ? empagliflozin (JARDIANCE) 10 MG TABS tablet Take 1 tablet (10 mg total) by mouth daily before breakfast. 30 tablet 5  ? metoprolol succinate (TOPROL-XL) 50 MG 24 hr tablet TAKE 1 TABLET  BY MOUTH ONCE DAILY --TAKE  WITH  OR  IMMEDIATELY  FOLLOWING  A  MEAL 90 tablet 0  ? nitroGLYCERIN (NITRODUR - DOSED IN MG/24 HR) 0.2 mg/hr patch Place 1/4 to 1/2 of a patch over affected region. Remove and replace once daily.  Slightly alter skin placement daily 30 patch 1  ? potassium chloride SA (KLOR-CON M) 20 MEQ tablet Take 1 tablet (20 mEq total) by mouth daily. 30 tablet 3  ? triamterene-hydrochlorothiazide (MAXZIDE) 75-50 MG tablet Take 1 tablet by mouth daily. 90 tablet 1  ? ?No current facility-administered medications for this visit.  ? ? ?Allergies:   Patient has no known allergies.  ? ? ?Social History:  The patient  reports that he has never smoked. He has never used smokeless tobacco. He reports that he does not currently use alcohol. He reports that he does not use drugs.  ? ?Family History:  The patient's family history includes Coronary artery  disease in his father; Hypertension in his father.  ? ? ?ROS:  Please see the history of present illness.  ? ? ?Physical Exam: ?Blood pressure 140/78, pulse 65, height 6\' 1"  (1.854 m), weight 254 lb 3.2 oz (115.3 kg), SpO2 97 %. ? ?GEN:  middle age male.   ?HEENT: Normal ?NECK: No JVD; No carotid bruits ?LYMPHATICS: No lymphadenopathy ?CARDIAC: RRR , no murmurs, rubs, gallops ?RESPIRATORY:  Clear to auscultation without rales, wheezing or rhonchi  ?ABDOMEN: Soft, non-tender, non-distended ?MUSCULOSKELETAL:  No edema; No deformity  ?SKIN: Warm and dry ?NEUROLOGIC:  Alert and oriented x 3 ? ? ?EKG:    May 19, 2021: Normal sinus rhythm at 65.  Minimal voltage criteria for LVH.  T wave abnormality-likely due to LVH. ? ? ?Recent Labs: ?11/04/2020: Hemoglobin 13.9; Platelets 290.0 ?01/06/2021: Magnesium 1.8 ?04/15/2021: ALT 21; BUN 21; Creatinine, Ser 1.32; Potassium 3.8; Sodium 138  ? ? ?Lipid Panel ?   ?Component Value Date/Time  ? CHOL 174 11/04/2020 1559  ? TRIG 129.0 11/04/2020 1559  ? HDL 51.40 11/04/2020 1559  ? CHOLHDL 3 11/04/2020 1559  ? VLDL 25.8 11/04/2020 1559  ? [place] 97 11/04/2020 1559  ? ?  ? ?Wt Readings from Last 3 Encounters:  ?05/19/21 254 lb 3.2 oz (115.3 kg)  ?04/15/21 260 lb 6.4 oz (118.1 kg)  ?01/15/21 270 lb (122.5 kg)  ?  ? ? ?Other studies Reviewed: ?Additional studies/ records that were reviewed today include: . ?Review of the above records demonstrates:  ? ? ?ASSESSMENT AND PLAN: ? ?1. Hypertension-       needs to avoid salt ,  increase exercise  ?  ? ?2. Left ventricular Hypertrophy -  asymptomtic  ? ? ?3. Transient atrial fibrillation-     ? Remains in NSR  ? ?4.   Suspected sleep apnea:    ?Has not had a sleep study ..  does not think he needs a sleep study  ? ?  ? ?Current medicines are reviewed at length with the patient today.  The patient does not have concerns regarding medicines. ? ?The following changes have been made:  Increase Maxzide  ? ?Disposition:   FU with me in  1 year   ? ? ?Mertie Moores, MD  ?05/19/2021 2:25 PM    ?[facility] ?[address], [place], [place]  [address] ?Phone: [phone]; Fax: [phone]  ?

## 2021-05-26 ENCOUNTER — Other Ambulatory Visit: Payer: Self-pay | Admitting: Family Medicine

## 2021-05-26 DIAGNOSIS — I10 Essential (primary) hypertension: Secondary | ICD-10-CM

## 2021-07-07 ENCOUNTER — Telehealth: Payer: Self-pay | Admitting: Family Medicine

## 2021-07-07 ENCOUNTER — Ambulatory Visit (INDEPENDENT_AMBULATORY_CARE_PROVIDER_SITE_OTHER): Payer: BC Managed Care – PPO | Admitting: Family Medicine

## 2021-07-07 ENCOUNTER — Encounter: Payer: Self-pay | Admitting: Family Medicine

## 2021-07-07 VITALS — BP 124/90 | HR 63 | Temp 97.8°F | Ht 73.0 in | Wt 249.0 lb

## 2021-07-07 DIAGNOSIS — E876 Hypokalemia: Secondary | ICD-10-CM

## 2021-07-07 DIAGNOSIS — R7309 Other abnormal glucose: Secondary | ICD-10-CM | POA: Diagnosis not present

## 2021-07-07 DIAGNOSIS — I10 Essential (primary) hypertension: Secondary | ICD-10-CM

## 2021-07-07 DIAGNOSIS — Z1211 Encounter for screening for malignant neoplasm of colon: Secondary | ICD-10-CM

## 2021-07-07 DIAGNOSIS — I4891 Unspecified atrial fibrillation: Secondary | ICD-10-CM

## 2021-07-07 DIAGNOSIS — Z0000 Encounter for general adult medical examination without abnormal findings: Secondary | ICD-10-CM | POA: Insufficient documentation

## 2021-07-07 MED ORDER — EMPAGLIFLOZIN 10 MG PO TABS: 10.0000 mg | ORAL_TABLET | Freq: Every day | ORAL | 5 refills | Status: DC

## 2021-07-07 MED ORDER — METOPROLOL SUCCINATE ER 50 MG PO TB24: ORAL_TABLET | ORAL | 3 refills | Status: DC

## 2021-07-07 MED ORDER — TRIAMTERENE-HCTZ 75-50 MG PO TABS: 1.0000 | ORAL_TABLET | Freq: Every day | ORAL | 1 refills | Status: DC

## 2021-07-07 MED ORDER — POTASSIUM CHLORIDE CRYS ER 20 MEQ PO TBCR: 20.0000 meq | EXTENDED_RELEASE_TABLET | Freq: Every day | ORAL | 1 refills | Status: DC

## 2021-07-07 NOTE — Progress Notes (Signed)
Established Patient Office Visit  Subjective   Patient ID: Nicholas Hughes, male    DOB: [birth year]  Age: 51 y.o. MRN: [id number]  Chief Complaint  Patient presents with   Follow-up    6 month follow up concerns about possible poor circulation in legs when wearing socks.     HPI follow-up of hypertension, elevated glucose and hypokalemia.  Blood pressure at home has been running in the 120/80 range.  Tolerating medications well.  Continues to consume a low carbohydrate and sodium diet.  Has been able to lose 11 pounds!  Feels well.  Occasional abdominal bloating.  Has occasional swelling in his legs.    Review of Systems  Constitutional: Negative.   HENT: Negative.    Eyes:  Negative for blurred vision, discharge and redness.  Respiratory: Negative.    Cardiovascular: Negative.   Gastrointestinal:  Negative for abdominal pain.  Genitourinary: Negative.   Musculoskeletal: Negative.  Negative for myalgias.  Skin:  Negative for rash.  Neurological:  Negative for tingling, loss of consciousness and weakness.  Endo/Heme/Allergies:  Negative for polydipsia.      Objective:     BP 124/90 (BP Location: Right Arm, Patient Position: Sitting, Cuff Size: Large)   Pulse 63   Temp 97.8 F (36.6 C) (Temporal)   Ht 6\' 1"  (1.854 m)   Wt 249 lb (112.9 kg)   SpO2 96%   BMI 32.85 kg/m  BP Readings from Last 3 Encounters:  07/07/21 124/90  05/19/21 140/78  04/15/21 (!) 150/86   Wt Readings from Last 3 Encounters:  07/07/21 249 lb (112.9 kg)  05/19/21 254 lb 3.2 oz (115.3 kg)  04/15/21 260 lb 6.4 oz (118.1 kg)      Physical Exam Constitutional:      General: He is not in acute distress.    Appearance: Normal appearance. He is not ill-appearing, toxic-appearing or diaphoretic.  HENT:     Head: Normocephalic and atraumatic.     Right Ear: External ear normal.     Left Ear: External ear normal.     Mouth/Throat:     Mouth: Mucous membranes are moist.     Pharynx: Oropharynx  is clear. No oropharyngeal exudate or posterior oropharyngeal erythema.  Eyes:     General: No scleral icterus.       Right eye: No discharge.        Left eye: No discharge.     Extraocular Movements: Extraocular movements intact.     Conjunctiva/sclera: Conjunctivae normal.     Pupils: Pupils are equal, round, and reactive to light.  Cardiovascular:     Rate and Rhythm: Normal rate and regular rhythm.     Pulses:          Popliteal pulses are 2+ on the right side.       Dorsalis pedis pulses are 2+ on the right side.  Pulmonary:     Effort: Pulmonary effort is normal. No respiratory distress.     Breath sounds: Normal breath sounds.  Abdominal:     General: Bowel sounds are normal.     Tenderness: There is no abdominal tenderness. There is no guarding.  Musculoskeletal:     Cervical back: No rigidity or tenderness.     Right lower leg: No edema.     Left lower leg: No edema.  Skin:    General: Skin is warm and dry.  Neurological:     Mental Status: He is alert and oriented to person, place, and time.  Psychiatric:        Mood and Affect: Mood normal.        Behavior: Behavior normal.      No results found for any visits on 07/07/21.    The 10-year ASCVD risk score (Arnett DK, et al., 2019) is: 7.9%    Assessment & Plan:   Problem List Items Addressed This Visit       Cardiovascular and Mediastinum   Essential hypertension   Relevant Medications   metoprolol succinate (TOPROL-XL) 50 MG 24 hr tablet   triamterene-hydrochlorothiazide (MAXZIDE) 75-50 MG tablet   Other Relevant Orders   Basic metabolic panel   Atrial fibrillation, transient (HCC)   Relevant Medications   metoprolol succinate (TOPROL-XL) 50 MG 24 hr tablet   triamterene-hydrochlorothiazide (MAXZIDE) 75-50 MG tablet     Other   Elevated glucose   Relevant Medications   empagliflozin (JARDIANCE) 10 MG TABS tablet   Other Relevant Orders   Basic metabolic panel   Screen for colon cancer -  Primary   Relevant Orders   Ambulatory referral to Gastroenterology   Hypokalemia   Relevant Medications   potassium chloride SA (KLOR-CON M) 20 MEQ tablet   Other Relevant Orders   Basic metabolic panel    Return in about 6 months (around 01/06/2022).  Keep up the good work and continue weight loss.  Continue current medications including Jardiance.  Mliss Sax, MD

## 2021-07-07 NOTE — Telephone Encounter (Signed)
Pt have medication TROPOL-XL . But pt stated it's no directions with it . Pt doesn't know to take it once or twice a day. Please call [facility] @ [phone]

## 2021-07-07 NOTE — Telephone Encounter (Signed)
Pharmacist aware of dosage of medication.

## 2021-07-08 LAB — BASIC METABOLIC PANEL
BUN: 19 mg/dL (ref 6–23)
CO2: 26 mEq/L (ref 19–32)
Calcium: 9.6 mg/dL (ref 8.4–10.5)
Chloride: 101 mEq/L (ref 96–112)
Creatinine, Ser: 1.35 mg/dL (ref 0.40–1.50)
GFR: 61.09 mL/min (ref >60.00)
Glucose, Bld: 68 mg/dL — ABNORMAL LOW (ref 70–99)
Potassium: 3.9 mEq/L (ref 3.5–5.1)
Sodium: 137 mEq/L (ref 135–145)

## 2021-08-20 ENCOUNTER — Telehealth: Payer: Self-pay | Admitting: *Deleted

## 2021-08-20 NOTE — Chronic Care Management (AMB) (Unsigned)
  Care Management   Outreach Note  08/20/2021 Name: Nicholas Hughes MRN: [id number] DOB: 01-01-1971  An unsuccessful telephone outreach was attempted today. The patient was referred to the case management team for assistance with care management and care coordination.   Follow Up Plan:  A HIPAA compliant phone message was left for the patient providing contact information and requesting a return call.  The care management team will reach out to the patient again over the next 7 days.  If patient returns call to provider office, please advise to call Embedded Care Management Care Guide Misty Stanley* at [phone].Misty Stanley [facility]  Care Coordination Care Guide  Direct Dial: [phone]

## 2021-08-21 NOTE — Chronic Care Management (AMB) (Signed)
  Care Coordination  Note  08/21/2021 Name: Nicholas Hughes MRN: [id number] DOB: 1970-05-29  [name] is a 51 y.o. year old male who is a primary care patient of Mliss Sax, MD. I reached out to [facility] by phone today to offer care coordination services.      [name] was given information about Care Coordination services today including:  The Care Coordination services include support from the care team which includes your Nurse Coordinator, Clinical Social Worker, or Pharmacist.  The Care Coordination team is here to help remove barriers to the health concerns and goals most important to you. Care Coordination services are voluntary and the patient may decline or stop services at any time by request to their care team member.   Patient agreed to services and verbal consent obtained.   Follow up plan: Telephone appointment with care coordination team member scheduled for:08/27/21  [facility] Coordination Care Guide  Direct Dial: [phone]

## 2021-08-27 ENCOUNTER — Ambulatory Visit: Payer: Self-pay

## 2021-08-28 ENCOUNTER — Ambulatory Visit: Payer: Self-pay

## 2021-08-28 NOTE — Patient Outreach (Signed)
  Care Coordination   08/27/21 Name: Nicholas Hughes MRN: [id number] DOB: 20-Oct-1970   Care Coordination Outreach Attempts:  An unsuccessful telephone outreach was attempted today to offer the patient information about available care coordination services as a benefit of their health plan.   Follow Up Plan:  Additional outreach attempts will be made to offer the patient care coordination information and services.   Encounter Outcome:  No Answer  Care Coordination Interventions Activated:  No   Care Coordination Interventions:  No, not indicated    Juanell Fairly RN, BSN, [facility] Care Coordinator [facility]   Phone: [phone]

## 2021-08-28 NOTE — Patient Instructions (Signed)
Visit Information  Thank you for taking time to visit with me today. Please don't hesitate to contact me if I can be of assistance to you.   Following are the goals we discussed today:   Goals Addressed               This Visit's Progress     My blood pressure runs high (pt-stated)        Care Coordination Interventions: Evaluation of current treatment plan related to hypertension self management and patient's adherence to plan as established by provider Reviewed medications with patient and discussed importance of compliance Provided assistance with obtaining home blood pressure monitor the patient has wrist monitor advised him to get an arm monitor for better reading Counseled on the importance of exercise goals with target of 150 minutes per week Discussed plans with patient for ongoing care management follow up and provided patient with direct contact information for care management team Advised patient, providing education and rationale, to monitor blood pressure daily and record, calling PCP for findings outside established parameters Provided education on prescribed diet to monitor sodium Discussed complications of poorly controlled blood pressure such as heart disease, stroke, circulatory complications, vision complications, kidney impairment, sexual dysfunction Will send educational material on blood pressure and how to check.          Our next appointment is by telephone on 09/04/21 at 315 pm  Please call the care guide team at [phone] if you need to cancel or reschedule your appointment.   Patient verbalizes understanding of instructions and care plan provided today and agrees to view in MyChart. Active MyChart status and patient understanding of how to access instructions and care plan via MyChart confirmed with patient.     Juanell Fairly RN, BSN, [facility] Care Coordinator [facility]   Phone: [phone]

## 2021-08-28 NOTE — Patient Outreach (Signed)
  Care Coordination   Initial Visit Note   08/28/2021 Name: Nicholas Hughes MRN: [id number] DOB: [birth year]  Nicholas Hughes is a 51 y.o. year old male who sees Mliss Sax, MD for primary care. I spoke with  Nicholas Hughes by phone today  What matters to the patients health and wellness today?  My blood pressure fluctuates.    Goals Addressed               This Visit's Progress     My blood pressure runs high (pt-stated)        Care Coordination Interventions: Evaluation of current treatment plan related to hypertension self management and patient's adherence to plan as established by provider Reviewed medications with patient and discussed importance of compliance Provided assistance with obtaining home blood pressure monitor the patient has wrist monitor advised him to get an arm monitor for better reading Counseled on the importance of exercise goals with target of 150 minutes per week Discussed plans with patient for ongoing care management follow up and provided patient with direct contact information for care management team Advised patient, providing education and rationale, to monitor blood pressure daily and record, calling PCP for findings outside established parameters Provided education on prescribed diet to monitor sodium Discussed complications of poorly controlled blood pressure such as heart disease, stroke, circulatory complications, vision complications, kidney impairment, sexual dysfunction Will send educational material on blood pressure and how to check.          SDOH assessments and interventions completed:  Yes  SDOH Interventions Today    Flowsheet Row Most Recent Value  SDOH Interventions   Food Insecurity Interventions Intervention Not Indicated  Housing Interventions Intervention Not Indicated  Transportation Interventions Intervention Not Indicated        Care Coordination Interventions Activated:  Yes  Care Coordination  Interventions:  Yes, provided   Follow up plan: Follow up call scheduled for 09/04/21 at 315 pm   Encounter Outcome:  Pt. Scheduled   Juanell Fairly RN, BSN, [facility] Care Coordinator [facility]   Phone: [phone]

## 2021-09-04 ENCOUNTER — Ambulatory Visit: Payer: Self-pay

## 2021-09-04 NOTE — Patient Outreach (Signed)
  Care Coordination   Follow Up Visit Note   09/04/2021 Name: Nicholas Hughes MRN: [id number] DOB: Jul 20, 1970  Nicholas Hughes is a 51 y.o. year old male who sees Mliss Sax, MD for primary care. I spoke with  Blenda Bridegroom by phone today  What matters to the patients health and wellness today?  My blood pressure readings are better with proper technique.     Goals Addressed               This Visit's Progress     COMPLETED: My blood pressure runs high (pt-stated)        Care Coordination Interventions: Evaluation of current treatment plan related to hypertension self management and patient's adherence to plan as established by provider Reviewed medications with patient and discussed importance of compliance Provided assistance with obtaining home blood pressure monitor the patient has wrist monitor advised him to get an arm monitor for better reading Counseled on the importance of exercise goals with target of 150 minutes per week 09/04/21: The patient received the educational material.  He started he checked his blood pressure using different method and now having better readings 130/88. Advised him to continue his regiment of diet, exercise taking his medications, checking his blood pressure, recording the values, and continued follow ups with his physician.        SDOH assessments and interventions completed:  No     Care Coordination Interventions Activated:  Yes  Care Coordination Interventions:  Yes, provided   Follow up plan: No further intervention required.   Encounter Outcome:  Pt. Visit Completed   Juanell Fairly RN, BSN, [facility] Care Coordinator [facility]   Phone: [phone]

## 2021-09-04 NOTE — Patient Instructions (Signed)
Visit Information  Thank you for taking time to visit with me today. Please don't hesitate to contact me if I can be of assistance to you.   Following are the goals we discussed today:   Goals Addressed               This Visit's Progress     COMPLETED: My blood pressure runs high (pt-stated)        Care Coordination Interventions: Evaluation of current treatment plan related to hypertension self management and patient's adherence to plan as established by provider Reviewed medications with patient and discussed importance of compliance Provided assistance with obtaining home blood pressure monitor the patient has wrist monitor advised him to get an arm monitor for better reading Counseled on the importance of exercise goals with target of 150 minutes per week 09/04/21: The patient received the educational material.  He started he checked his blood pressure using different method and now having better readings 130/88. Advised him to continue his regiment of diet, exercise taking his medications, checking his blood pressure, recording the values, and continued follow ups with his physician.        Patient verbalizes understanding of instructions and care plan provided today and agrees to view in MyChart. Active MyChart status and patient understanding of how to access instructions and care plan via MyChart confirmed with patient.     Juanell Fairly RN, BSN, [facility] Care Coordinator [facility]   Phone: [phone]

## 2021-09-14 ENCOUNTER — Emergency Department (HOSPITAL_BASED_OUTPATIENT_CLINIC_OR_DEPARTMENT_OTHER)
Admission: EM | Admit: 2021-09-14 | Discharge: 2021-09-14 | Disposition: A | Discharge status: Home / Self Care | Payer: BC Managed Care – PPO | Attending: Emergency Medicine | Admitting: Emergency Medicine

## 2021-09-14 ENCOUNTER — Other Ambulatory Visit: Payer: Self-pay

## 2021-09-14 ENCOUNTER — Encounter (HOSPITAL_BASED_OUTPATIENT_CLINIC_OR_DEPARTMENT_OTHER): Payer: Self-pay | Admitting: Emergency Medicine

## 2021-09-14 DIAGNOSIS — R634 Abnormal weight loss: Secondary | ICD-10-CM

## 2021-09-14 DIAGNOSIS — R7989 Other specified abnormal findings of blood chemistry: Secondary | ICD-10-CM | POA: Insufficient documentation

## 2021-09-14 LAB — CBC WITH DIFFERENTIAL/PLATELET
Abs Immature Granulocytes: 0.01 10*3/uL (ref 0.00–0.07)
Basophils Absolute: 0 10*3/uL (ref 0.0–0.1)
Basophils Relative: 1 %
Eosinophils Absolute: 0 10*3/uL (ref 0.0–0.5)
Eosinophils Relative: 1 %
HCT: 45 % (ref 39.0–52.0)
Hemoglobin: 15 g/dL (ref 13.0–17.0)
Immature Granulocytes: 0 %
Lymphocytes Relative: 33 %
Lymphs Abs: 1.8 10*3/uL (ref 0.7–4.0)
MCH: 28.4 pg (ref 26.0–34.0)
MCHC: 33.3 g/dL (ref 30.0–36.0)
MCV: 85.2 fL (ref 80.0–100.0)
Monocytes Absolute: 0.7 10*3/uL (ref 0.1–1.0)
Monocytes Relative: 12 %
Neutro Abs: 2.8 10*3/uL (ref 1.7–7.7)
Neutrophils Relative %: 53 %
Platelets: 272 10*3/uL (ref 150–400)
RBC: 5.28 MIL/uL (ref 4.22–5.81)
RDW: 12.2 % (ref 11.5–15.5)
WBC: 5.4 10*3/uL (ref 4.0–10.5)
nRBC: 0 % (ref 0.0–0.2)

## 2021-09-14 LAB — BASIC METABOLIC PANEL
Anion gap: 8 (ref 5–15)
BUN: 23 mg/dL — ABNORMAL HIGH (ref 6–20)
CO2: 29 mmol/L (ref 22–32)
Calcium: 9.5 mg/dL (ref 8.9–10.3)
Chloride: 102 mmol/L (ref 98–111)
Creatinine, Ser: 1.63 mg/dL — ABNORMAL HIGH (ref 0.61–1.24)
GFR, Estimated: 51 mL/min — ABNORMAL LOW (ref >60)
Glucose, Bld: 106 mg/dL — ABNORMAL HIGH (ref 70–99)
Potassium: 3.6 mmol/L (ref 3.5–5.1)
Sodium: 139 mmol/L (ref 135–145)

## 2021-09-14 NOTE — ED Triage Notes (Signed)
Pt reports he lost weight around body more rapidly than he expect. No changes in medication. Also says his legs feel weird. Denies numbness or tingling.

## 2021-09-14 NOTE — ED Notes (Signed)
Patient states that he fells that rt side is sink en in more since his wt loss

## 2021-09-14 NOTE — ED Provider Notes (Signed)
[facility] EMERGENCY DEPARTMENT Provider Note   CSN: [id number] Arrival date & time: 09/14/21  1514     History  No chief complaint on file.   Nicholas Hughes is a 51 y.o. male who presents to the emergency department with concerns for weight loss.  No meds tried.  Denies any new medications.  Notes that he feels like there is a weird sensation to his bilateral lower extremities.  Has discussed his concerns with his primary care provider who has thoroughly evaluated the patient.  Patient denies chest pain, shortness of breath, abdominal pain, fever, nausea, vomiting.  The history is provided by the patient. No language interpreter was used.       Home Medications Prior to Admission medications   Medication Sig Start Date End Date Taking? Authorizing Provider  diltiazem (CARTIA XT) 300 MG 24 hr capsule Take 1 capsule (300 mg total) by mouth daily. 01/06/21 01/01/22  Mliss Sax, MD  empagliflozin (JARDIANCE) 10 MG TABS tablet Take 1 tablet (10 mg total) by mouth daily before breakfast. 07/07/21   Mliss Sax, MD  metoprolol succinate (TOPROL-XL) 50 MG 24 hr tablet Take with or immediately following a meal. 07/07/21   Mliss Sax, MD  nitroGLYCERIN (NITRODUR - DOSED IN MG/24 HR) 0.2 mg/hr patch Place 1/4 to 1/2 of a patch over affected region. Remove and replace once daily.  Slightly alter skin placement daily 12/30/20   Rodolph Bong, MD  potassium chloride SA (KLOR-CON M) 20 MEQ tablet Take 1 tablet (20 mEq total) by mouth daily. 07/07/21   Mliss Sax, MD  triamterene-hydrochlorothiazide (MAXZIDE) 75-50 MG tablet Take 1 tablet by mouth daily. 07/07/21 07/02/22  Mliss Sax, MD      Allergies    Patient has no known allergies.    Review of Systems   Review of Systems  Constitutional:  Negative for fever.  Respiratory:  Negative for shortness of breath.   Cardiovascular:  Negative for chest pain.  Gastrointestinal:   Negative for abdominal pain, nausea and vomiting.  All other systems reviewed and are negative.   Physical Exam Updated Vital Signs BP (!) 171/115 (BP Location: Left Arm)   Pulse 64   Temp 98.1 F (36.7 C) (Oral)   Resp 20   SpO2 100%  Physical Exam Vitals and nursing note reviewed.  Constitutional:      General: He is not in acute distress.    Appearance: He is not diaphoretic.  HENT:     Head: Normocephalic and atraumatic.     Mouth/Throat:     Pharynx: No oropharyngeal exudate.  Eyes:     General: No scleral icterus.    Conjunctiva/sclera: Conjunctivae normal.  Cardiovascular:     Rate and Rhythm: Normal rate and regular rhythm.     Pulses: Normal pulses.     Heart sounds: Normal heart sounds.  Pulmonary:     Effort: Pulmonary effort is normal. No respiratory distress.     Breath sounds: Normal breath sounds. No wheezing.  Abdominal:     General: Bowel sounds are normal.     Palpations: Abdomen is soft. There is no mass.     Tenderness: There is no abdominal tenderness. There is no guarding or rebound.  Musculoskeletal:        General: Normal range of motion.     Cervical back: Normal range of motion and neck supple.     Comments:  No holes noted on exam, patient with muscle definition  noted on exam.   Skin:    General: Skin is warm and dry.  Neurological:     Mental Status: He is alert.     Comments: Able to ambulate without assistance or difficulty.  No focal neurological deficit.  Negative pronator drift.  Psychiatric:        Behavior: Behavior normal.     ED Results / Procedures / Treatments   Labs (all labs ordered are listed, but only abnormal results are displayed) Labs Reviewed  BASIC METABOLIC PANEL - Abnormal; Notable for the following components:      Result Value   Glucose, Bld 106 (*)    BUN 23 (*)    Creatinine, Ser 1.63 (*)    GFR, Estimated 51 (*)    All other components within normal limits  CBC WITH DIFFERENTIAL/PLATELET     EKG None  Radiology No results found.  Procedures Procedures    Medications Ordered in ED Medications - No data to display  ED Course/ Medical Decision Making/ A&P                            Medical Decision Making Amount and/or Complexity of Data Reviewed Labs: ordered.   Pt presents with concerns for weight loss.  He notes that he has "holes" in his skin that he is also concerned for.  Patient afebrile.  On exam patient without acute cardiovascular, respiratory, abdominal exam findings.  No focal neurological deficits on exam.  Patient able to ambulate without assistance or difficulty.  No holes noted on exam, patient with muscle definition noted on exam.     Labs:  I ordered, and personally interpreted labs.  The pertinent results include:   CBC without leukocytosis. CMP with slightly elevated creatinine and BUN, likely in the setting of AKI.  Disposition: Presenting suspicious for concerns for weight loss, doubt electrolyte abnormality or anemia at this time.  Doubt cardiac or respiratory etiologies at this time, patient is completely asymptomatic today.  Only presented to the emergency department due to concerns for presumed weight loss. After consideration of the diagnostic results and the patients response to treatment, I feel that the patient would benefit from Discharge home.  Review of patient's weight since 2012 in chart, patient with stable fluctuance of weight either increasing or decreasing.  Most recent weight today in the emergency department at 252.4 pounds, this is increased from previous documented value in June 2023 of 249 pounds.  Feel that patient can be safely discharged to follow-up with his primary care provider regarding additional concerns with weight.  Answered all available questions. Supportive care measures and strict return precautions discussed with patient at bedside. Pt acknowledges and verbalizes understanding. Pt appears safe for discharge.  Follow up as indicated in discharge paperwork.    This chart was dictated using voice recognition software, Dragon. Despite the best efforts of this provider to proofread and correct errors, errors may still occur which can change documentation meaning.   Final Clinical Impression(s) / ED Diagnoses Final diagnoses:  None    Rx / DC Orders ED Discharge Orders     None         Nikole Swartzentruber A, PA-C 09/14/21 1822    Virgina Norfolk, DO 09/14/21 1835

## 2021-09-14 NOTE — Discharge Instructions (Addendum)
It was a pleasure taking care of you today!   Your labs were overall remarkable. Your labs showed that you may be slightly dehydrated, ensure that you are drinking fluids. Call your primary care provider to set up a follow up appointment regarding todays ED visit. Return to the Emergency Department if you are experiencing increasing/worsening chest pain, trouble breathing, nausea, vomiting, or worsening symptoms.

## 2021-09-22 DIAGNOSIS — B351 Tinea unguium: Secondary | ICD-10-CM | POA: Diagnosis not present

## 2021-09-22 DIAGNOSIS — M79675 Pain in left toe(s): Secondary | ICD-10-CM | POA: Diagnosis not present

## 2021-09-22 DIAGNOSIS — M79674 Pain in right toe(s): Secondary | ICD-10-CM | POA: Diagnosis not present

## 2021-09-22 DIAGNOSIS — L600 Ingrowing nail: Secondary | ICD-10-CM | POA: Diagnosis not present

## 2021-12-22 ENCOUNTER — Other Ambulatory Visit: Payer: Self-pay | Admitting: Family Medicine

## 2021-12-22 DIAGNOSIS — I10 Essential (primary) hypertension: Secondary | ICD-10-CM

## 2021-12-22 DIAGNOSIS — I4891 Unspecified atrial fibrillation: Secondary | ICD-10-CM

## 2022-01-15 DIAGNOSIS — M79675 Pain in left toe(s): Secondary | ICD-10-CM | POA: Diagnosis not present

## 2022-01-15 DIAGNOSIS — M79674 Pain in right toe(s): Secondary | ICD-10-CM | POA: Diagnosis not present

## 2022-01-15 DIAGNOSIS — L600 Ingrowing nail: Secondary | ICD-10-CM | POA: Diagnosis not present

## 2022-01-15 DIAGNOSIS — B351 Tinea unguium: Secondary | ICD-10-CM | POA: Diagnosis not present

## 2022-02-12 DIAGNOSIS — M79675 Pain in left toe(s): Secondary | ICD-10-CM | POA: Diagnosis not present

## 2022-02-12 DIAGNOSIS — M79674 Pain in right toe(s): Secondary | ICD-10-CM | POA: Diagnosis not present

## 2022-02-12 DIAGNOSIS — L600 Ingrowing nail: Secondary | ICD-10-CM | POA: Diagnosis not present

## 2022-02-12 DIAGNOSIS — B351 Tinea unguium: Secondary | ICD-10-CM | POA: Diagnosis not present

## 2022-03-07 ENCOUNTER — Emergency Department (HOSPITAL_BASED_OUTPATIENT_CLINIC_OR_DEPARTMENT_OTHER)
Admission: EM | Admit: 2022-03-07 | Discharge: 2022-03-07 | Disposition: A | Discharge status: Home / Self Care | Payer: BC Managed Care – PPO | Attending: Emergency Medicine | Admitting: Emergency Medicine

## 2022-03-07 ENCOUNTER — Emergency Department (HOSPITAL_BASED_OUTPATIENT_CLINIC_OR_DEPARTMENT_OTHER): Payer: BC Managed Care – PPO

## 2022-03-07 ENCOUNTER — Encounter (HOSPITAL_BASED_OUTPATIENT_CLINIC_OR_DEPARTMENT_OTHER): Payer: Self-pay | Admitting: Emergency Medicine

## 2022-03-07 ENCOUNTER — Other Ambulatory Visit: Payer: Self-pay

## 2022-03-07 DIAGNOSIS — N189 Chronic kidney disease, unspecified: Secondary | ICD-10-CM | POA: Insufficient documentation

## 2022-03-07 DIAGNOSIS — I10 Essential (primary) hypertension: Secondary | ICD-10-CM | POA: Diagnosis not present

## 2022-03-07 DIAGNOSIS — R0789 Other chest pain: Secondary | ICD-10-CM | POA: Diagnosis not present

## 2022-03-07 DIAGNOSIS — Z20822 Contact with and (suspected) exposure to covid-19: Secondary | ICD-10-CM | POA: Diagnosis not present

## 2022-03-07 DIAGNOSIS — R5383 Other fatigue: Secondary | ICD-10-CM | POA: Diagnosis not present

## 2022-03-07 DIAGNOSIS — I129 Hypertensive chronic kidney disease with stage 1 through stage 4 chronic kidney disease, or unspecified chronic kidney disease: Secondary | ICD-10-CM | POA: Diagnosis not present

## 2022-03-07 DIAGNOSIS — Z79899 Other long term (current) drug therapy: Secondary | ICD-10-CM | POA: Insufficient documentation

## 2022-03-07 LAB — BASIC METABOLIC PANEL
Anion gap: 9 (ref 5–15)
BUN: 17 mg/dL (ref 6–20)
CO2: 27 mmol/L (ref 22–32)
Calcium: 9.6 mg/dL (ref 8.9–10.3)
Chloride: 100 mmol/L (ref 98–111)
Creatinine, Ser: 1.33 mg/dL — ABNORMAL HIGH (ref 0.61–1.24)
GFR, Estimated: >60 mL/min (ref >60)
Glucose, Bld: 92 mg/dL (ref 70–99)
Potassium: 3.7 mmol/L (ref 3.5–5.1)
Sodium: 136 mmol/L (ref 135–145)

## 2022-03-07 LAB — CBC
HCT: 41.9 % (ref 39.0–52.0)
Hemoglobin: 14.1 g/dL (ref 13.0–17.0)
MCH: 28.4 pg (ref 26.0–34.0)
MCHC: 33.7 g/dL (ref 30.0–36.0)
MCV: 84.5 fL (ref 80.0–100.0)
Platelets: 277 10*3/uL (ref 150–400)
RBC: 4.96 MIL/uL (ref 4.22–5.81)
RDW: 12.1 % (ref 11.5–15.5)
WBC: 4.8 10*3/uL (ref 4.0–10.5)
nRBC: 0 % (ref 0.0–0.2)

## 2022-03-07 LAB — RESP PANEL BY RT-PCR (RSV, FLU A&B, COVID)  RVPGX2
Influenza A by PCR: NEGATIVE
Influenza B by PCR: NEGATIVE
Resp Syncytial Virus by PCR: NEGATIVE
SARS Coronavirus 2 by RT PCR: NEGATIVE

## 2022-03-07 LAB — TROPONIN I (HIGH SENSITIVITY): Troponin I (High Sensitivity): 10 ng/L (ref <18)

## 2022-03-07 NOTE — ED Triage Notes (Signed)
Pt reports "weakness in chest" for the past few days. Denies chest pain, shortness, lightheadedness, flu-like symptoms, or palpitations.

## 2022-03-07 NOTE — ED Notes (Signed)
Discharge paperwork reviewed entirely with patient, including Rx's and follow up care. Pain was under control. Pt verbalized understanding as well as all parties involved. No questions or concerns voiced at the time of discharge. No acute distress noted.   Pt ambulated out to PVA without incident or assistance.

## 2022-03-07 NOTE — ED Provider Notes (Signed)
[name] Provider Note   CSN: [id number] Arrival date & time: 03/07/22  [id number]     History  Chief Complaint  Patient presents with   chest feels strange    Nicholas Hughes is a 52 y.o. male.  With a history of CKD, GERD, LVH, hypertension, OSA who presents to the ED for evaluation of "chest weakness" and fatigue.  He states the symptoms began 3 days ago and have been intermittent.  He is unable to describe what chest weakness means but feels like he has been working hard all day.  He specifically denies chest pain, shortness of breath, cough, dizziness, lightheadedness, congestion, rhinorrhea, abdominal pain, nausea, vomiting, diaphoresis.  He states he has been eating and drinking a healthy diet recently.  He has been taking his medications as prescribed.  HPI     Home Medications Prior to Admission medications   Medication Sig Start Date End Date Taking? Authorizing Provider  diltiazem (CARDIZEM CD) 300 MG 24 hr capsule Take 1 capsule by mouth once daily 12/22/21   Libby Maw, MD  empagliflozin (JARDIANCE) 10 MG TABS tablet Take 1 tablet (10 mg total) by mouth daily before breakfast. 07/07/21   Libby Maw, MD  metoprolol succinate (TOPROL-XL) 50 MG 24 hr tablet Take with or immediately following a meal. 07/07/21   Libby Maw, MD  nitroGLYCERIN (NITRODUR - DOSED IN MG/24 HR) 0.2 mg/hr patch Place 1/4 to 1/2 of a patch over affected region. Remove and replace once daily.  Slightly alter skin placement daily 12/30/20   Gregor Hams, MD  potassium chloride SA (KLOR-CON M) 20 MEQ tablet Take 1 tablet (20 mEq total) by mouth daily. 07/07/21   Libby Maw, MD  triamterene-hydrochlorothiazide (MAXZIDE) 75-50 MG tablet Take 1 tablet by mouth daily. 07/07/21 07/02/22  Libby Maw, MD      Allergies    Patient has no known allergies.    Review of Systems   Review of Systems  Constitutional:   Positive for fatigue.  All other systems reviewed and are negative.   Physical Exam Updated Vital Signs BP (!) 154/99   Pulse (!) 55   Temp 97.6 F (36.4 C) (Oral)   Resp 16   SpO2 98%  Physical Exam Vitals and nursing note reviewed.  Constitutional:      General: He is not in acute distress.    Appearance: Normal appearance. He is well-developed. He is not ill-appearing, toxic-appearing or diaphoretic.     Comments: Resting comfortably in bed  HENT:     Head: Normocephalic and atraumatic.     Nose: No congestion or rhinorrhea.     Mouth/Throat:     Mouth: Mucous membranes are moist.     Pharynx: Oropharynx is clear. No oropharyngeal exudate or posterior oropharyngeal erythema.  Eyes:     Conjunctiva/sclera: Conjunctivae normal.  Cardiovascular:     Rate and Rhythm: Normal rate and regular rhythm.     Heart sounds: No murmur heard. Pulmonary:     Effort: Pulmonary effort is normal. No respiratory distress.     Breath sounds: Normal breath sounds. No stridor. No wheezing, rhonchi or rales.  Abdominal:     Palpations: Abdomen is soft.     Tenderness: There is no abdominal tenderness.  Musculoskeletal:        General: No swelling.     Cervical back: Neck supple.     Right lower leg: No edema.  Left lower leg: No edema.  Skin:    General: Skin is warm and dry.     Capillary Refill: Capillary refill takes less than 2 seconds.  Neurological:     General: No focal deficit present.     Mental Status: He is alert and oriented to person, place, and time.  Psychiatric:        Mood and Affect: Mood normal.        Behavior: Behavior normal.     ED Results / Procedures / Treatments   Labs (all labs ordered are listed, but only abnormal results are displayed) Labs Reviewed  BASIC METABOLIC PANEL - Abnormal; Notable for the following components:      Result Value   Creatinine, Ser 1.33 (*)    All other components within normal limits  RESP PANEL BY RT-PCR (RSV, FLU A&B,  COVID)  RVPGX2  CBC  TROPONIN I (HIGH SENSITIVITY)    EKG EKG Interpretation  Date/Time:  Saturday March 07 2022 09:41:41 EST Ventricular Rate:  52 PR Interval:  179 QRS Duration: 95 QT Interval:  416 QTC Calculation: 387 R Axis:   16 Text Interpretation: Sinus rhythm Abnormal R-wave progression, early transition LVH with secondary repolarization abnormality Similar to 2020 tracing Confirmed by Nanda Quinton [phone]) on 03/07/2022 9:50:42 AM  Radiology DG Chest 2 View  Result Date: 03/07/2022 CLINICAL DATA:  Chest discomfort. EXAM: CHEST - 2 VIEW COMPARISON:  01/06/2015 and prior studies FINDINGS: Telemetry leads overlie the chest. The cardiomediastinal silhouette is unremarkable. There is no evidence of focal airspace disease, pulmonary edema, suspicious pulmonary nodule/mass, pleural effusion, or pneumothorax. No acute bony abnormalities are identified. IMPRESSION: No active cardiopulmonary disease. Electronically Signed   By: Margarette Canada M.D.   On: 03/07/2022 10:16    Procedures Procedures    Medications Ordered in ED Medications - No data to display  ED Course/ Medical Decision Making/ A&P                             Medical Decision Making Amount and/or Complexity of Data Reviewed Labs: ordered. Radiology: ordered.  This patient presents to the ED for concern of fatigue, this involves an extensive number of treatment options, and is a complaint that carries with it a high risk of complications and morbidity. The differential diagnosis of weakness includes but is not limited to neurologic causes (GBS, myasthenia gravis, CVA, MS, ALS, transverse myelitis, spinal cord injury, CVA, botulism, ) and other causes: ACS, Arrhythmia, syncope, orthostatic hypotension, sepsis, hypoglycemia, electrolyte disturbance, hypothyroidism, respiratory failure, symptomatic anemia, dehydration, heat injury, polypharmacy, malignancy.   Co morbidities that complicate the patient evaluation    CKD, GERD, LVH, hypertension, OSA  My initial workup includes ACS rule out  Additional history obtained from: Nursing notes from this visit.  I ordered, reviewed and interpreted labs which include: BMP, CBC, troponin.  BMP showed elevated creatinine of 1.33 which is improved from baseline.  He does have a history of CKD  I ordered imaging studies including chest x-ray I independently visualized and interpreted imaging which showed normal I agree with the radiologist interpretation  Cardiac Monitoring:  The patient was maintained on a cardiac monitor.  I personally viewed and interpreted the cardiac monitored which showed an underlying rhythm of: NSR/borderline sinus bradycardia  Afebrile, slightly hypertensive but otherwise hemodynamically stable.  52 year old male presenting to the ED for evaluation of "chest weakness" and generalized fatigue.  The symptoms are described as  very mild.  They have been present for 3 days.  He has no abnormalities on his physical exam outside of obesity.  He appears overall very well.  His lab workup is remarkable for a creatinine of 1.33 which is improved from previous results.  He does have a history of CKD.  His chest x-ray is unremarkable.  He has no chest pain or shortness of breath.  He was encouraged to follow-up with his cardiologist for further evaluation.  I have low suspicion for acute emergencies at this time.  He was given return precautions.  Stable at discharge.  At this time there does not appear to be any evidence of an acute emergency medical condition and the patient appears stable for discharge with appropriate outpatient follow up. Diagnosis was discussed with patient who verbalizes understanding of care plan and is agreeable to discharge. I have discussed return precautions with patient who verbalizes understanding. Patient encouraged to follow-up with their PCP within 1 week. All questions answered.  Patient's case discussed with Dr. Laverta Baltimore  who agrees with plan to discharge with follow-up.   Note: Portions of this report may have been transcribed using voice recognition software. Every effort was made to ensure accuracy; however, inadvertent computerized transcription errors may still be present.        Final Clinical Impression(s) / ED Diagnoses Final diagnoses:  Other fatigue    Rx / DC Orders ED Discharge Orders     None         Roylene Reason, Hershal Coria 03/07/22 1142    Margette Fast, MD 03/08/22 [phone]

## 2022-03-07 NOTE — Discharge Instructions (Signed)
You have been seen today for your complaint of fatigue. Your lab work was overall very reassuring. Your imaging was reassuring and showed no abnormalities. Your discharge medications include your home medications. Home care instructions are as follows:  Continue to eat and drink a healthy diet Follow up with: Your cardiologist.  You should call on Monday to schedule an appointment for an ED follow-up visit regarding your symptoms Please seek immediate medical care if you develop any of the following symptoms: You feel confused, feel like you might faint, or faint. Your vision is blurry or you have a severe headache. You have severe pain in your abdomen, your back, or the area between your waist and hips (pelvis). You have chest pain, shortness of breath, or an irregular or fast heartbeat. You are unable to urinate, or you urinate less than normal. You have abnormal bleeding from the rectum, nose, lungs, nipples, or, if you are male, the vagina. You vomit blood. You have thoughts about hurting yourself or others. At this time there does not appear to be the presence of an emergent medical condition, however there is always the potential for conditions to change. Please read and follow the below instructions.  Do not take your medicine if  develop an itchy rash, swelling in your mouth or lips, or difficulty breathing; call 911 and seek immediate emergency medical attention if this occurs.  You may review your lab tests and imaging results in their entirety on your MyChart account.  Please discuss all results of fully with your primary care provider and other specialist at your follow-up visit.  Note: Portions of this text may have been transcribed using voice recognition software. Every effort was made to ensure accuracy; however, inadvertent computerized transcription errors may still be present.

## 2022-03-17 DIAGNOSIS — B351 Tinea unguium: Secondary | ICD-10-CM | POA: Diagnosis not present

## 2022-03-17 DIAGNOSIS — M79675 Pain in left toe(s): Secondary | ICD-10-CM | POA: Diagnosis not present

## 2022-03-17 DIAGNOSIS — M79674 Pain in right toe(s): Secondary | ICD-10-CM | POA: Diagnosis not present

## 2022-03-23 ENCOUNTER — Other Ambulatory Visit: Payer: Self-pay | Admitting: Family Medicine

## 2022-03-23 DIAGNOSIS — I10 Essential (primary) hypertension: Secondary | ICD-10-CM

## 2022-03-23 DIAGNOSIS — I4891 Unspecified atrial fibrillation: Secondary | ICD-10-CM

## 2022-03-26 ENCOUNTER — Other Ambulatory Visit: Payer: Self-pay | Admitting: Family Medicine

## 2022-03-26 DIAGNOSIS — I10 Essential (primary) hypertension: Secondary | ICD-10-CM

## 2022-03-30 ENCOUNTER — Encounter: Payer: Self-pay | Admitting: Family Medicine

## 2022-03-30 ENCOUNTER — Ambulatory Visit (INDEPENDENT_AMBULATORY_CARE_PROVIDER_SITE_OTHER): Payer: BC Managed Care – PPO | Admitting: Family Medicine

## 2022-03-30 VITALS — BP 148/120 | HR 58 | Temp 98.7°F | Ht 73.0 in | Wt 262.0 lb

## 2022-03-30 DIAGNOSIS — I10 Essential (primary) hypertension: Secondary | ICD-10-CM

## 2022-03-30 DIAGNOSIS — E876 Hypokalemia: Secondary | ICD-10-CM | POA: Diagnosis not present

## 2022-03-30 DIAGNOSIS — Z1211 Encounter for screening for malignant neoplasm of colon: Secondary | ICD-10-CM

## 2022-03-30 DIAGNOSIS — G4733 Obstructive sleep apnea (adult) (pediatric): Secondary | ICD-10-CM

## 2022-03-30 DIAGNOSIS — L720 Epidermal cyst: Secondary | ICD-10-CM

## 2022-03-30 DIAGNOSIS — I4891 Unspecified atrial fibrillation: Secondary | ICD-10-CM | POA: Diagnosis not present

## 2022-03-30 DIAGNOSIS — R7303 Prediabetes: Secondary | ICD-10-CM | POA: Diagnosis not present

## 2022-03-30 LAB — BASIC METABOLIC PANEL
BUN: 14 mg/dL (ref 6–23)
CO2: 30 mEq/L (ref 19–32)
Calcium: 10 mg/dL (ref 8.4–10.5)
Chloride: 101 mEq/L (ref 96–112)
Creatinine, Ser: 1.34 mg/dL (ref 0.40–1.50)
GFR: 61.33 mL/min (ref >60.00)
Glucose, Bld: 81 mg/dL (ref 70–99)
Potassium: 3.9 mEq/L (ref 3.5–5.1)
Sodium: 140 mEq/L (ref 135–145)

## 2022-03-30 LAB — HEMOGLOBIN A1C: Hgb A1c MFr Bld: 6.6 % — ABNORMAL HIGH (ref 4.6–6.5)

## 2022-03-30 MED ORDER — EMPAGLIFLOZIN 10 MG PO TABS: 10.0000 mg | ORAL_TABLET | Freq: Every day | ORAL | 5 refills | Status: DC

## 2022-03-30 MED ORDER — DOXYCYCLINE HYCLATE 100 MG PO TABS: 100.0000 mg | ORAL_TABLET | Freq: Two times a day (BID) | ORAL | 0 refills | Status: AC

## 2022-03-30 MED ORDER — POTASSIUM CHLORIDE CRYS ER 20 MEQ PO TBCR: 20.0000 meq | EXTENDED_RELEASE_TABLET | Freq: Every day | ORAL | 3 refills | Status: DC

## 2022-03-30 MED ORDER — CARVEDILOL 12.5 MG PO TABS: 12.5000 mg | ORAL_TABLET | Freq: Two times a day (BID) | ORAL | 3 refills | Status: DC

## 2022-03-30 MED ORDER — DILTIAZEM HCL ER COATED BEADS 300 MG PO CP24: 300.0000 mg | ORAL_CAPSULE | Freq: Every day | ORAL | 0 refills | Status: DC

## 2022-03-30 MED ORDER — TRIAMTERENE-HCTZ 75-50 MG PO TABS: 1.0000 | ORAL_TABLET | Freq: Every day | ORAL | 0 refills | Status: DC

## 2022-03-30 NOTE — Progress Notes (Signed)
Established Patient Office Visit   Subjective:  Patient ID: Nicholas Hughes, male    DOB: 1970-03-25  Age: 52 y.o. MRN: [id number]  Chief Complaint  Patient presents with   Medication Refill    BP check    Medication Refill Pertinent negatives include no abdominal pain, myalgias, rash or weakness.   Encounter Diagnoses  Name Primary?   Pre-diabetes Yes   Essential hypertension    Atrial fibrillation, transient ([place])    Screening for colon cancer    OSA (obstructive sleep apnea)    Hypokalemia    Inclusion cyst    For follow-up of above.  Blood pressure has been running in the 140s over 90s at home.  Sleep apnea has not been treated.  He is waking up through the night.  Did not make it to see GI for consultation for colonoscopy.   Review of Systems  Constitutional: Negative.   HENT: Negative.    Eyes:  Negative for blurred vision, discharge and redness.  Respiratory: Negative.    Cardiovascular: Negative.   Gastrointestinal:  Negative for abdominal pain.  Genitourinary: Negative.   Musculoskeletal: Negative.  Negative for myalgias.  Skin:  Negative for rash.  Neurological:  Negative for tingling, loss of consciousness and weakness.  Endo/Heme/Allergies:  Negative for polydipsia.     Current Outpatient Medications:    carvedilol (COREG) 12.5 MG tablet, Take 1 tablet (12.5 mg total) by mouth 2 (two) times daily with a meal., Disp: 60 tablet, Rfl: 3   doxycycline (VIBRA-TABS) 100 MG tablet, Take 1 tablet (100 mg total) by mouth 2 (two) times daily for 10 days., Disp: 20 tablet, Rfl: 0   terbinafine (LAMISIL) 250 MG tablet, Take 250 mg by mouth daily., Disp: , Rfl:    diltiazem (CARDIZEM CD) 300 MG 24 hr capsule, Take 1 capsule (300 mg total) by mouth daily., Disp: 90 capsule, Rfl: 0   empagliflozin (JARDIANCE) 10 MG TABS tablet, Take 1 tablet (10 mg total) by mouth daily before breakfast., Disp: 30 tablet, Rfl: 5   potassium chloride SA (KLOR-CON M) 20 MEQ tablet, Take 1  tablet (20 mEq total) by mouth daily., Disp: 90 tablet, Rfl: 3   triamterene-hydrochlorothiazide (MAXZIDE) 75-50 MG tablet, Take 1 tablet by mouth daily., Disp: 90 tablet, Rfl: 0   Objective:     BP (!) 148/120 (BP Location: Right Arm, Patient Position: Sitting, Cuff Size: Large)   Pulse (!) 58   Temp 98.7 F (37.1 C) (Oral)   Ht '6\' 1"'$  (1.854 m)   Wt 262 lb (118.8 kg)   SpO2 97%   BMI 34.57 kg/m  BP Readings from Last 3 Encounters:  03/30/22 (!) 148/120  03/07/22 (!) 141/95  09/14/21 (!) 171/115   Wt Readings from Last 3 Encounters:  03/30/22 262 lb (118.8 kg)  07/07/21 249 lb (112.9 kg)  05/19/21 254 lb 3.2 oz (115.3 kg)      Physical Exam Constitutional:      General: He is not in acute distress.    Appearance: Normal appearance. He is not ill-appearing, toxic-appearing or diaphoretic.  HENT:     Head: Normocephalic and atraumatic.     Right Ear: External ear normal.     Left Ear: External ear normal.     Mouth/Throat:     Mouth: Mucous membranes are moist.     Pharynx: Oropharynx is clear. No oropharyngeal exudate or posterior oropharyngeal erythema.   Eyes:     General: No scleral icterus.  Right eye: No discharge.        Left eye: No discharge.     Extraocular Movements: Extraocular movements intact.     Conjunctiva/sclera: Conjunctivae normal.  Cardiovascular:     Rate and Rhythm: Normal rate and regular rhythm.  Pulmonary:     Effort: Pulmonary effort is normal. No respiratory distress.     Breath sounds: Normal breath sounds.  Skin:    General: Skin is warm and dry.       Neurological:     Mental Status: He is alert and oriented to person, place, and time.  Psychiatric:        Mood and Affect: Mood normal.        Behavior: Behavior normal.      No results found for any visits on 03/30/22.  I will [place] just  The 10-year ASCVD risk score (Arnett DK, et al., 2019) is: 11.4%    Assessment & Plan:   Pre-diabetes -     Empagliflozin;  Take 1 tablet (10 mg total) by mouth daily before breakfast.  Dispense: 30 tablet; Refill: 5 -     Basic metabolic panel -     Hemoglobin A1c  Essential hypertension -     Triamterene-HCTZ; Take 1 tablet by mouth daily.  Dispense: 90 tablet; Refill: 0 -     dilTIAZem HCl ER Coated Beads; Take 1 capsule (300 mg total) by mouth daily.  Dispense: 90 capsule; Refill: 0 -     Carvedilol; Take 1 tablet (12.5 mg total) by mouth 2 (two) times daily with a meal.  Dispense: 60 tablet; Refill: 3 -     Basic metabolic panel  Atrial fibrillation, transient ([facility]) -     dilTIAZem HCl ER Coated Beads; Take 1 capsule (300 mg total) by mouth daily.  Dispense: 90 capsule; Refill: 0  Screening for colon cancer -     Ambulatory referral to Gastroenterology  OSA (obstructive sleep apnea) -     Ambulatory referral to Pulmonology  Hypokalemia -     Basic metabolic panel  Inclusion cyst -     Doxycycline Hyclate; Take 1 tablet (100 mg total) by mouth 2 (two) times daily for 10 days.  Dispense: 20 tablet; Refill: 0  Other orders -     Potassium Chloride Crys ER; Take 1 tablet (20 mEq total) by mouth daily.  Dispense: 90 tablet; Refill: 3    Return in about 8 weeks (around 05/25/2022), or Recheck of blood pressure and physical exam.  Please be fasting..  Have referred to pulmonology for follow-up of apnea.  Have discontinued metoprolol in favor of carvedilol.   Libby Maw, MD

## 2022-04-09 NOTE — Progress Notes (Signed)
04/10/22- 21 yoM never smoker for sleep evaluation courtesy of Dr Ethelene Hal with concern of OSA Medical problem list includes HTN, PAFib, GERD, CKD2,  NPSG 07/02/14- AHI 20/ hr, desaturation to 78%, body weight 247 lbs CPAP  Download compliance Epworth score Body weight today Covid vax- Flu vax- -----Had a sleep study in 2016 advised study did not record. Occ will snore, several witnessed episodes of apneas, pt advises he gets 3-4 hrs of wakes up several times throughout the night  He indicates there was some kind of a problem with the CPAP titration study years ago and he never got home CPAP. 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  He wakes every few hours during the night.  Dozes easily during the day but insists he is okay driving. No ENT surgery or lung problems.  Followed by cardiology after history of Atrial fibrillation. No sleep medicines and almost no caffeine.  Prior to Admission medications   Medication Sig Start Date End Date Taking? Authorizing Provider  carvedilol (COREG) 12.5 MG tablet Take 1 tablet (12.5 mg total) by mouth 2 (two) times daily with a meal. 03/30/22  Yes Libby Maw, MD  diltiazem (CARDIZEM CD) 300 MG 24 hr capsule Take 1 capsule (300 mg total) by mouth daily. 03/30/22  Yes Libby Maw, MD  doxycycline (ADOXA) 50 MG tablet Take 50 mg by mouth daily. Mg unknown   Yes [provider]  empagliflozin (JARDIANCE) 10 MG TABS tablet Take 1 tablet (10 mg total) by mouth daily before breakfast. 03/30/22  Yes Libby Maw, MD  potassium chloride SA (KLOR-CON M) 20 MEQ tablet Take 1 tablet (20 mEq total) by mouth daily. 03/30/22  Yes Libby Maw, MD  terbinafine (LAMISIL) 250 MG tablet Take 250 mg by mouth daily. 03/17/22  Yes [provider]  triamterene-hydrochlorothiazide (MAXZIDE) 75-50 MG tablet Take 1 tablet by mouth daily. 03/30/22   Yes Libby Maw, MD   Past Medical History:  Diagnosis Date   Borderline diabetes    CKD (chronic kidney disease) stage 2, GFR 60-89 ml/min    ED (erectile dysfunction)    GERD (gastroesophageal reflux disease)    Hypertension    LVH (left ventricular hypertrophy)    OSA (obstructive sleep apnea) 07/14/2014   Moderate OSA with AHI 20/hr   Renal insufficiency    Transient atrial fibrillation or flutter    Past Surgical History:  Procedure Laterality Date   TRANSTHORACIC ECHOCARDIOGRAM  05/07/2009   EF 60-65%   Family History  Problem Relation Age of Onset   Hypertension Father    Coronary artery disease Father    Social History   Socioeconomic History   Marital status: Single    Spouse name: Not on file   Number of children: Not on file   Years of education: Not on file   Highest education level: Not on file  Occupational History   Not on file  Tobacco Use   Smoking status: Never   Smokeless tobacco: Never  Vaping Use   Vaping Use: Never used  Substance and Sexual Activity   Alcohol use: Not Currently    Comment: occasionally   Drug use: No   Sexual activity: Yes  Other Topics Concern   Not on file  Social History Narrative   Right handed   One story home   Drinks no caffeine  Social Determinants of Health   Financial Resource Strain: Not on file  Food Insecurity: Not on file  Transportation Needs: Not on file  Physical Activity: Not on file  Stress: Not on file  Social Connections: Not on file  Intimate Partner Violence: Not on file   ROS-see HPI   += positive Constitutional:    weight loss, night sweats, fevers, chills, fatigue, lassitude. HEENT:    headaches, difficulty swallowing, tooth/dental problems, sore throat,       sneezing, itching, ear ache, nasal congestion, post nasal drip, snoring CV:    chest pain, orthopnea, PND, swelling in lower extremities, anasarca,                                   dizziness, palpitations Resp:    shortness of breath with exertion or at rest.                productive cough,   non-productive cough, coughing up of blood.              change in color of mucus.  wheezing.   Skin:    rash or lesions. GI:  No-   heartburn, indigestion, abdominal pain, nausea, vomiting, diarrhea,                 change in bowel habits, loss of appetite GU: dysuria, change in color of urine, no urgency or frequency.   flank pain. MS:   joint pain, stiffness, decreased range of motion, back pain. Neuro-     nothing unusual Psych:  change in mood or affect.  depression or anxiety.   memory loss.  OBJ- Physical Exam General- Alert, Oriented, Affect-appropriate, Distress- none acute Skin- rash-none, lesions- none, excoriation- none Lymphadenopathy- none Head- atraumatic            Eyes- Gross vision intact, PERRLA, conjunctivae and secretions clear            Ears- Hearing, canals-normal            Nose- Clear, no-Septal dev, mucus, polyps, erosion, perforation             Throat- Mallampati IV , mucosa clear , drainage- none, tonsils+, +teeth Neck- flexible , trachea midline, no stridor , thyroid nl, carotid no bruit Chest - symmetrical excursion , unlabored           Heart/CV- RRR , no murmur , no gallop  , no rub, nl s1 s2                           - JVD- none , edema- none, stasis changes- none, varices- none           Lung- clear to P&A, wheeze- none, cough- none , dullness-none, rub- none           Chest wall-  Abd-  Br/ Gen/ Rectal- Not done, not indicated Extrem- cyanosis- none, clubbing, none, atrophy- none, strength- nl Neuro- grossly intact to observation

## 2022-04-10 ENCOUNTER — Encounter: Payer: Self-pay | Admitting: Internal Medicine

## 2022-04-10 ENCOUNTER — Ambulatory Visit (INDEPENDENT_AMBULATORY_CARE_PROVIDER_SITE_OTHER): Payer: BC Managed Care – PPO | Admitting: Internal Medicine

## 2022-04-10 VITALS — BP 140/92 | HR 86 | Ht 72.0 in | Wt 261.4 lb

## 2022-04-10 DIAGNOSIS — G4733 Obstructive sleep apnea (adult) (pediatric): Secondary | ICD-10-CM | POA: Diagnosis not present

## 2022-04-10 DIAGNOSIS — I4891 Unspecified atrial fibrillation: Secondary | ICD-10-CM

## 2022-04-10 NOTE — Patient Instructions (Signed)
Order- new DME, new CPAP auto 5-20, mask of choice, humidifier, supplies, AirView/ card      Based on sleep study 07/02/14   AHI 20/ hr   If the home care company can't work with the old sleep study we will help you schedule a new study

## 2022-04-10 NOTE — Assessment & Plan Note (Addendum)
Appropriate discussion including medical concerns, treatment options, safe driving.  Not clear why he never got started on CPAP originally.  If insurance will accept his baseline diagnostic study from 2016 we will go straight to [facility], otherwise I anticipate ordering a home sleep test.  He understands this.

## 2022-04-10 NOTE — Assessment & Plan Note (Signed)
He has been followed by cardiology but says there has not been a recurrence.

## 2022-04-14 ENCOUNTER — Telehealth: Payer: Self-pay | Admitting: Internal Medicine

## 2022-04-14 DIAGNOSIS — G4733 Obstructive sleep apnea (adult) (pediatric): Secondary | ICD-10-CM

## 2022-04-14 NOTE — Telephone Encounter (Signed)
Attempted to call pt but unable to reach. Left message to return call.   Will place order after speaking with pt.

## 2022-04-14 NOTE — Telephone Encounter (Signed)
Yes thanks. I think an HST would meet his needs.   Dx OSA

## 2022-04-14 NOTE — Telephone Encounter (Signed)
Dr. Annamaria Boots, please advise on this if you are okay with [place] ordering a new sleep study to be done on pt.

## 2022-04-20 NOTE — Telephone Encounter (Signed)
Pt. Calling back to see what the nurse wanted and told him that he need a new HST and someone would call him back

## 2022-04-21 DIAGNOSIS — M79674 Pain in right toe(s): Secondary | ICD-10-CM | POA: Diagnosis not present

## 2022-04-21 DIAGNOSIS — B351 Tinea unguium: Secondary | ICD-10-CM | POA: Diagnosis not present

## 2022-04-21 DIAGNOSIS — L600 Ingrowing nail: Secondary | ICD-10-CM | POA: Diagnosis not present

## 2022-04-21 DIAGNOSIS — M79675 Pain in left toe(s): Secondary | ICD-10-CM | POA: Diagnosis not present

## 2022-04-22 NOTE — Telephone Encounter (Signed)
Called and spoke to pt regarding a new sleep study. Pt verbalized understanding, order has been placed.   Nothing further needed.

## 2022-05-26 ENCOUNTER — Encounter: Payer: BC Managed Care – PPO | Admitting: Family Medicine

## 2022-06-10 ENCOUNTER — Encounter (INDEPENDENT_AMBULATORY_CARE_PROVIDER_SITE_OTHER): Payer: BC Managed Care – PPO

## 2022-06-10 DIAGNOSIS — G4733 Obstructive sleep apnea (adult) (pediatric): Secondary | ICD-10-CM

## 2022-07-10 ENCOUNTER — Encounter: Payer: Self-pay | Admitting: Family Medicine

## 2022-07-10 ENCOUNTER — Ambulatory Visit (INDEPENDENT_AMBULATORY_CARE_PROVIDER_SITE_OTHER): Payer: BC Managed Care – PPO | Admitting: Family Medicine

## 2022-07-10 VITALS — BP 148/96 | HR 68 | Temp 97.5°F | Ht 72.0 in | Wt 253.6 lb

## 2022-07-10 DIAGNOSIS — R7303 Prediabetes: Secondary | ICD-10-CM | POA: Diagnosis not present

## 2022-07-10 DIAGNOSIS — I4891 Unspecified atrial fibrillation: Secondary | ICD-10-CM

## 2022-07-10 DIAGNOSIS — G4733 Obstructive sleep apnea (adult) (pediatric): Secondary | ICD-10-CM | POA: Diagnosis not present

## 2022-07-10 DIAGNOSIS — Z23 Encounter for immunization: Secondary | ICD-10-CM | POA: Diagnosis not present

## 2022-07-10 DIAGNOSIS — Z0000 Encounter for general adult medical examination without abnormal findings: Secondary | ICD-10-CM

## 2022-07-10 DIAGNOSIS — L720 Epidermal cyst: Secondary | ICD-10-CM

## 2022-07-10 DIAGNOSIS — I10 Essential (primary) hypertension: Secondary | ICD-10-CM

## 2022-07-10 LAB — URINALYSIS, ROUTINE W REFLEX MICROSCOPIC
Bilirubin Urine: NEGATIVE
Hgb urine dipstick: NEGATIVE
Ketones, ur: 15 — AB
Leukocytes,Ua: NEGATIVE
Nitrite: NEGATIVE
RBC / HPF: NONE SEEN (ref 0–?)
Specific Gravity, Urine: 1.025 (ref 1.000–1.030)
Total Protein, Urine: NEGATIVE
Urine Glucose: >=1000 — AB
Urobilinogen, UA: 0.2 (ref 0.0–1.0)
pH: 6 (ref 5.0–8.0)

## 2022-07-10 LAB — COMPREHENSIVE METABOLIC PANEL
ALT: 18 U/L (ref 0–53)
AST: 22 U/L (ref 0–37)
Albumin: 4.2 g/dL (ref 3.5–5.2)
Alkaline Phosphatase: 62 U/L (ref 39–117)
BUN: 15 mg/dL (ref 6–23)
CO2: 30 mEq/L (ref 19–32)
Calcium: 9.4 mg/dL (ref 8.4–10.5)
Chloride: 99 mEq/L (ref 96–112)
Creatinine, Ser: 1.29 mg/dL (ref 0.40–1.50)
GFR: 64.06 mL/min (ref >60.00)
Glucose, Bld: 79 mg/dL (ref 70–99)
Potassium: 3.6 mEq/L (ref 3.5–5.1)
Sodium: 139 mEq/L (ref 135–145)
Total Bilirubin: 0.7 mg/dL (ref 0.2–1.2)
Total Protein: 7.6 g/dL (ref 6.0–8.3)

## 2022-07-10 LAB — CBC
HCT: 42.3 % (ref 39.0–52.0)
Hemoglobin: 14.1 g/dL (ref 13.0–17.0)
MCHC: 33.3 g/dL (ref 30.0–36.0)
MCV: 84.9 fl (ref 78.0–100.0)
Platelets: 300 10*3/uL (ref 150.0–400.0)
RBC: 4.99 Mil/uL (ref 4.22–5.81)
RDW: 13.3 % (ref 11.5–15.5)
WBC: 4.3 10*3/uL (ref 4.0–10.5)

## 2022-07-10 LAB — LIPID PANEL
Cholesterol: 154 mg/dL (ref 0–200)
HDL: 44.8 mg/dL (ref >39.00)
LDL Cholesterol: 100 mg/dL — ABNORMAL HIGH (ref 0–99)
NonHDL: 108.72
Total CHOL/HDL Ratio: 3
Triglycerides: 46 mg/dL (ref 0.0–149.0)
VLDL: 9.2 mg/dL (ref 0.0–40.0)

## 2022-07-10 LAB — PSA: PSA: 1.08 ng/mL (ref 0.10–4.00)

## 2022-07-10 LAB — HEMOGLOBIN A1C: Hgb A1c MFr Bld: 6.4 % (ref 4.6–6.5)

## 2022-07-10 MED ORDER — CARVEDILOL 12.5 MG PO TABS: 12.5000 mg | ORAL_TABLET | Freq: Two times a day (BID) | ORAL | 1 refills | Status: DC

## 2022-07-10 MED ORDER — TRIAMTERENE-HCTZ 75-50 MG PO TABS: 1.0000 | ORAL_TABLET | Freq: Every day | ORAL | 0 refills | Status: DC

## 2022-07-10 MED ORDER — EMPAGLIFLOZIN 10 MG PO TABS: 10.0000 mg | ORAL_TABLET | Freq: Every day | ORAL | 5 refills | Status: DC

## 2022-07-10 MED ORDER — POTASSIUM CHLORIDE CRYS ER 20 MEQ PO TBCR: 20.0000 meq | EXTENDED_RELEASE_TABLET | Freq: Every day | ORAL | 3 refills | Status: DC

## 2022-07-10 MED ORDER — DILTIAZEM HCL ER COATED BEADS 300 MG PO CP24: 300.0000 mg | ORAL_CAPSULE | Freq: Every day | ORAL | 0 refills | Status: DC

## 2022-07-10 NOTE — Progress Notes (Signed)
Established Patient Office Visit   Subjective:  Patient ID: Nicholas Hughes, male    DOB: [birth year]  Age: 52 y.o. MRN: [id number]  Chief Complaint  Patient presents with   Annual Exam    CPE, no concerns. Patient fasting.     HPI Encounter Diagnoses  Name Primary?   Healthcare maintenance Yes   Essential hypertension    Atrial fibrillation, transient ([facility])    Pre-diabetes    OSA (obstructive sleep apnea)    Inclusion cyst    Immunization due    For health check and follow-up of above.  Blood pressure has been a little lower at home running in the 130s over upper 80s.  He has appreciated no further episodes of A-fib.  He has been consuming a heart healthy diet.  He is avoiding salt.  Continues Jardiance for prediabetes.  Status post sleep study and has been scheduled for follow-up appointment with sleep medicine for CPAP.  Inclusion cyst in right cheek area continues to drain.  It has not responded to an antibiotic.  He would like to start the Shingrix series.  Continues to workout regularly has regular dental care.   Review of Systems  Constitutional: Negative.   HENT: Negative.    Eyes:  Negative for blurred vision, discharge and redness.  Respiratory: Negative.    Cardiovascular: Negative.   Gastrointestinal:  Negative for abdominal pain.  Genitourinary: Negative.   Musculoskeletal: Negative.  Negative for myalgias.  Skin:  Negative for rash.  Neurological:  Negative for tingling, loss of consciousness and weakness.  Endo/Heme/Allergies:  Negative for polydipsia.     Current Outpatient Medications:    carvedilol (COREG) 12.5 MG tablet, Take 1 tablet (12.5 mg total) by mouth 2 (two) times daily with a meal., Disp: 180 tablet, Rfl: 1   diltiazem (CARDIZEM CD) 300 MG 24 hr capsule, Take 1 capsule (300 mg total) by mouth daily., Disp: 90 capsule, Rfl: 0   empagliflozin (JARDIANCE) 10 MG TABS tablet, Take 1 tablet (10 mg total) by mouth daily before breakfast., Disp: 30  tablet, Rfl: 5   potassium chloride SA (KLOR-CON M) 20 MEQ tablet, Take 1 tablet (20 mEq total) by mouth daily., Disp: 90 tablet, Rfl: 3   terbinafine (LAMISIL) 250 MG tablet, Take 250 mg by mouth daily. (Patient not taking: Reported on 07/10/2022), Disp: , Rfl:    triamterene-hydrochlorothiazide (MAXZIDE) 75-50 MG tablet, Take 1 tablet by mouth daily., Disp: 90 tablet, Rfl: 0   Objective:     BP (!) 148/96 (BP Location: Right Arm, Patient Position: Sitting, Cuff Size: Large)   Pulse 68   Temp (!) 97.5 F (36.4 C) (Temporal)   Ht 6' (1.829 m)   Wt 253 lb 9.6 oz (115 kg)   SpO2 97%   BMI 34.39 kg/m  BP Readings from Last 3 Encounters:  07/10/22 (!) 148/96  04/10/22 (!) 140/92  03/30/22 (!) 148/120   Wt Readings from Last 3 Encounters:  07/10/22 253 lb 9.6 oz (115 kg)  04/10/22 261 lb 6.4 oz (118.6 kg)  03/30/22 262 lb (118.8 kg)      Physical Exam Constitutional:      General: He is not in acute distress.    Appearance: Normal appearance. He is not ill-appearing, toxic-appearing or diaphoretic.  HENT:     Head: Normocephalic and atraumatic.     Right Ear: External ear normal.     Left Ear: External ear normal.     Mouth/Throat:     Mouth: Mucous  membranes are moist.     Pharynx: Oropharynx is clear. No oropharyngeal exudate or posterior oropharyngeal erythema.  Eyes:     General: No scleral icterus.       Right eye: No discharge.        Left eye: No discharge.     Extraocular Movements: Extraocular movements intact.     Conjunctiva/sclera: Conjunctivae normal.     Pupils: Pupils are equal, round, and reactive to light.  Cardiovascular:     Rate and Rhythm: Normal rate and regular rhythm.  Pulmonary:     Effort: Pulmonary effort is normal. No respiratory distress.     Breath sounds: Normal breath sounds.  Abdominal:     General: Bowel sounds are normal.     Tenderness: There is no abdominal tenderness. There is no guarding or rebound.  Musculoskeletal:      Cervical back: No rigidity or tenderness.  Skin:    General: Skin is warm and dry.       Neurological:     Mental Status: He is alert and oriented to person, place, and time.  Psychiatric:        Mood and Affect: Mood normal.        Behavior: Behavior normal.      No results found for any visits on 07/10/22.    The 10-year ASCVD risk score (Arnett DK, et al., 2019) is: 11.4%    Assessment & Plan:   Healthcare maintenance -     Lipid panel -     PSA  Essential hypertension -     Carvedilol; Take 1 tablet (12.5 mg total) by mouth 2 (two) times daily with a meal.  Dispense: 180 tablet; Refill: 1 -     dilTIAZem HCl ER Coated Beads; Take 1 capsule (300 mg total) by mouth daily.  Dispense: 90 capsule; Refill: 0 -     Potassium Chloride Crys ER; Take 1 tablet (20 mEq total) by mouth daily.  Dispense: 90 tablet; Refill: 3 -     Triamterene-HCTZ; Take 1 tablet by mouth daily.  Dispense: 90 tablet; Refill: 0 -     CBC -     Comprehensive metabolic panel -     Urinalysis, Routine w reflex microscopic -     Ambulatory referral to Cardiology  Atrial fibrillation, transient ([facility]) -     dilTIAZem HCl ER Coated Beads; Take 1 capsule (300 mg total) by mouth daily.  Dispense: 90 capsule; Refill: 0 -     Ambulatory referral to Cardiology  Pre-diabetes -     Empagliflozin; Take 1 tablet (10 mg total) by mouth daily before breakfast.  Dispense: 30 tablet; Refill: 5 -     Comprehensive metabolic panel -     Hemoglobin A1c  OSA (obstructive sleep apnea)  Inclusion cyst -     Ambulatory referral to Dermatology  Immunization due -     Varicella-zoster vaccine IM    Return in about 3 months (around 10/10/2022).  Information given on health maintenance and disease prevention as well as hypertension management.  Cardiology referral for follow-up of blood pressure and A-fib.  Derm referral for inclusion cyst.  Continue follow-up with pulmonary for CPAP machine.  Mliss Sax,  MD

## 2022-07-12 NOTE — Progress Notes (Signed)
04/10/22- 51 yoM never smoker for sleep evaluation courtesy of Dr Doreene Burke with concern of OSA Medical problem list includes HTN, PAFib, GERD, CKD2,  NPSG 07/02/14- AHI 20/ hr, desaturation to 78%, body weight 247 lbs CPAP  Download compliance Epworth score Body weight today Covid vax- Flu vax- -----Had a sleep study in 2016 advised study did not record. Occ will snore, several witnessed episodes of apneas, pt advises he gets 3-4 hrs of sleep, wakes up several times throughout the night  He indicates there was some kind of a problem with the CPAP titration study years ago and he never got home CPAP. 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  He wakes every few hours during the night.  Dozes easily during the day but insists he is okay driving. No ENT surgery or lung problems.  Followed by cardiology after history of Atrial fibrillation. No sleep medicines and almost no caffeine.  07/14/22- 51 yoM never smoker followed for OSA, complicated by HTN, PAFib, GERD, CKD2,  HST 05/08/22- AHI 31.6/ hr, desaturation to 69%, body weight 262 lbs Body weight today- ? Needs new CPAP order? Sleep study reviewed and options discussed.  We are going to order new CPAP auto 5-20.  ROS-see HPI   += positive Constitutional:    weight loss, night sweats, fevers, chills, fatigue, lassitude. HEENT:    headaches, difficulty swallowing, tooth/dental problems, sore throat,       sneezing, itching, ear ache, nasal congestion, post nasal drip, snoring CV:    chest pain, orthopnea, PND, swelling in lower extremities, anasarca,                                   dizziness, palpitations Resp:   shortness of breath with exertion or at rest.                productive cough,   non-productive cough, coughing up of blood.              change in color of mucus.  wheezing.   Skin:    rash or lesions. GI:  No-   heartburn, indigestion, abdominal  pain, nausea, vomiting, diarrhea,                 change in bowel habits, loss of appetite GU: dysuria, change in color of urine, no urgency or frequency.   flank pain. MS:   joint pain, stiffness, decreased range of motion, back pain. Neuro-     nothing unusual Psych:  change in mood or affect.  depression or anxiety.   memory loss.  OBJ- Physical Exam General- Alert, Oriented, Affect-appropriate, Distress- none acute, +muscular/overweight Skin- rash-none, lesions- none, excoriation- none Lymphadenopathy- none Head- atraumatic            Eyes- Gross vision intact, PERRLA, conjunctivae and secretions clear            Ears- Hearing, canals-normal            Nose- Clear, no-Septal dev, mucus, polyps, erosion, perforation             Throat- Mallampati IV , mucosa clear , drainage- none, tonsils+, +teeth Neck- flexible , trachea midline, no stridor , thyroid nl, carotid no bruit Chest - symmetrical excursion , unlabored  Heart/CV- RRR , no murmur , no gallop  , no rub, nl s1 s2                           - JVD- none , edema- none, stasis changes- none, varices- none           Lung- clear to P&A, wheeze- none, cough- none , dullness-none, rub- none           Chest wall-  Abd-  Br/ Gen/ Rectal- Not done, not indicated Extrem- cyanosis- none, clubbing, none, atrophy- none, strength- nl Neuro- grossly intact to observation

## 2022-07-14 ENCOUNTER — Ambulatory Visit (INDEPENDENT_AMBULATORY_CARE_PROVIDER_SITE_OTHER): Payer: BC Managed Care – PPO | Admitting: Internal Medicine

## 2022-07-14 ENCOUNTER — Encounter: Payer: Self-pay | Admitting: Internal Medicine

## 2022-07-14 VITALS — BP 118/80 | HR 86 | Ht 72.0 in | Wt 252.0 lb

## 2022-07-14 DIAGNOSIS — G4733 Obstructive sleep apnea (adult) (pediatric): Secondary | ICD-10-CM

## 2022-07-14 DIAGNOSIS — I4891 Unspecified atrial fibrillation: Secondary | ICD-10-CM | POA: Diagnosis not present

## 2022-07-14 NOTE — Patient Instructions (Signed)
Order- new DME, new CPAP auto 5-20, mask of choice, humidifier, supplies, AirView/ card  Please call if we can help 

## 2022-07-21 DIAGNOSIS — L600 Ingrowing nail: Secondary | ICD-10-CM | POA: Diagnosis not present

## 2022-07-28 DIAGNOSIS — G4733 Obstructive sleep apnea (adult) (pediatric): Secondary | ICD-10-CM | POA: Diagnosis not present

## 2022-08-04 DIAGNOSIS — T8189XA Other complications of procedures, not elsewhere classified, initial encounter: Secondary | ICD-10-CM | POA: Diagnosis not present

## 2022-08-21 ENCOUNTER — Encounter: Payer: Self-pay | Admitting: Internal Medicine

## 2022-08-21 NOTE — Assessment & Plan Note (Signed)
Appropriate discussion done. Plan-replace CPAP, auto 5-20

## 2022-08-21 NOTE — Assessment & Plan Note (Signed)
Potential association between atrial fibrillation and OSA discussed.

## 2022-08-22 IMAGING — DX DG KNEE AP/LAT W/ SUNRISE*R*
3 series · 3 of 3 positions shown · non-contrast
Comparison: None.

CLINICAL DATA: Right knee pain, no known injury

EXAM:
RIGHT KNEE 3 VIEWS

[knee ap]
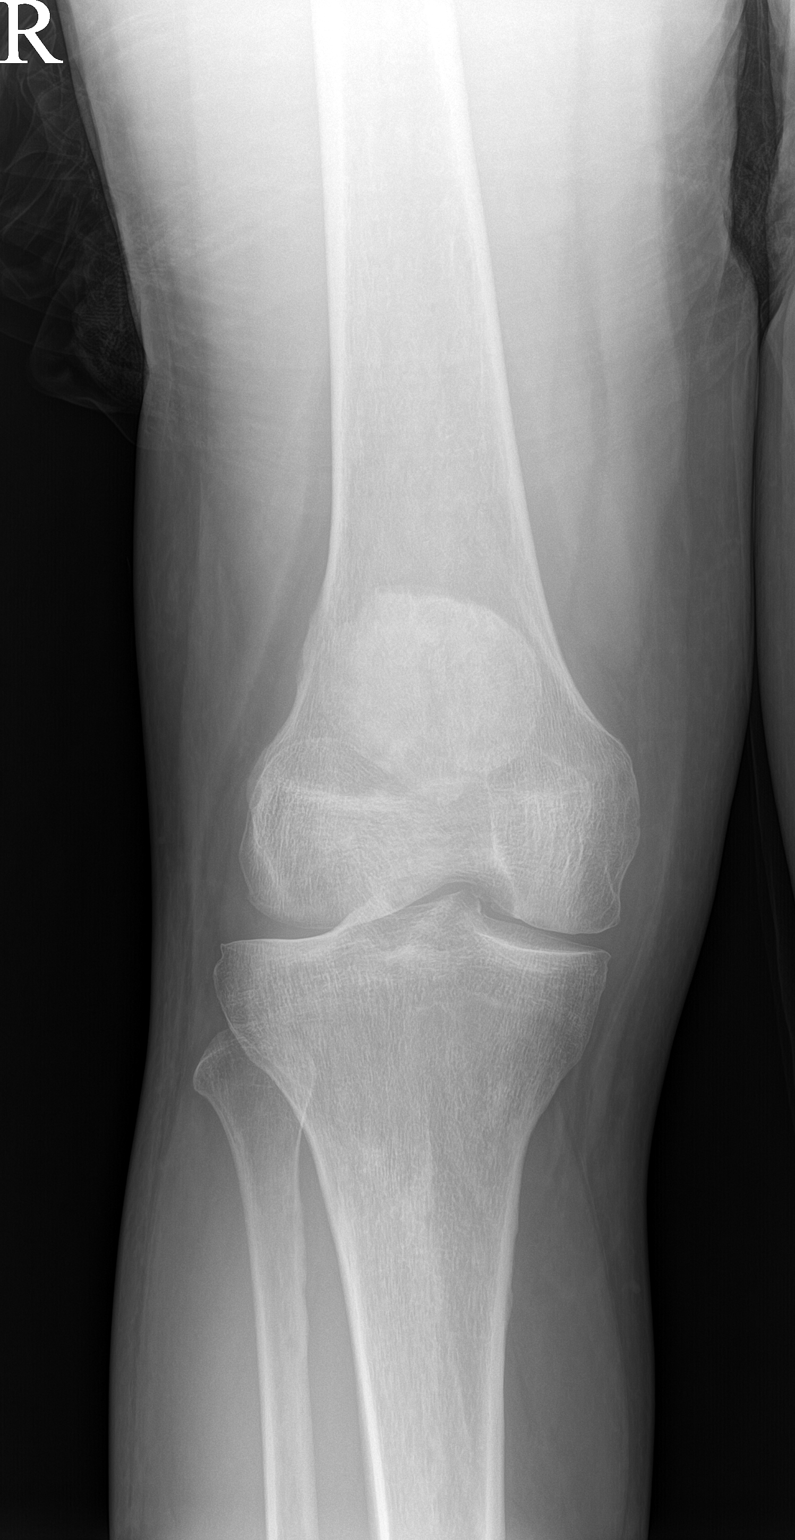

[knee lat]
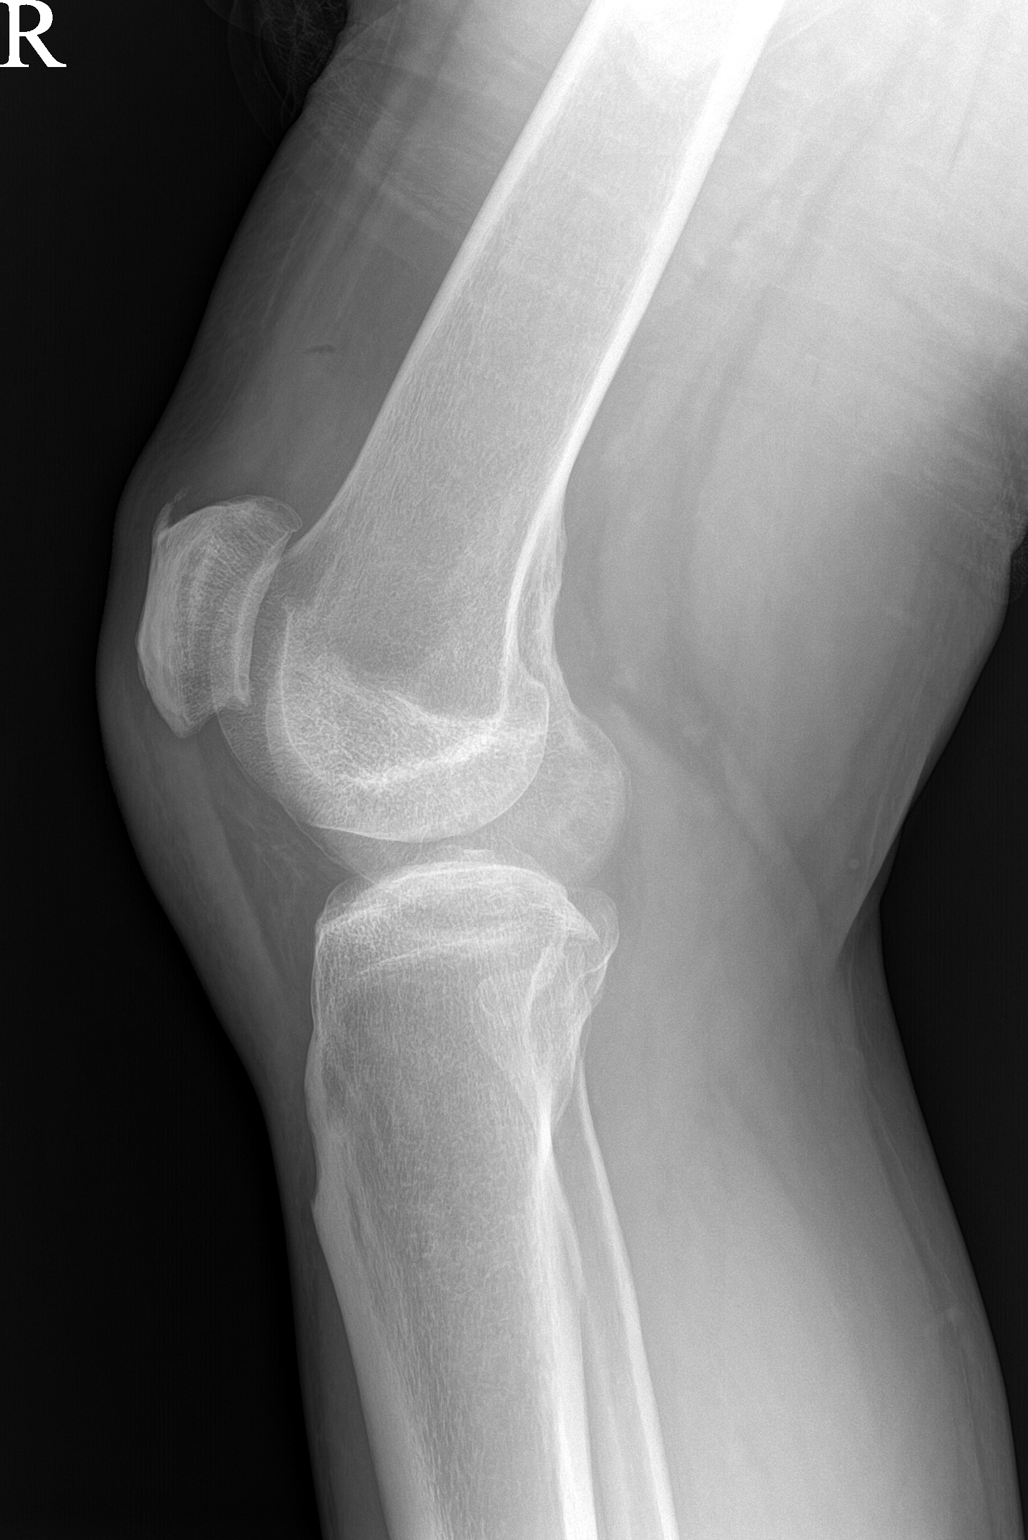

[patella]
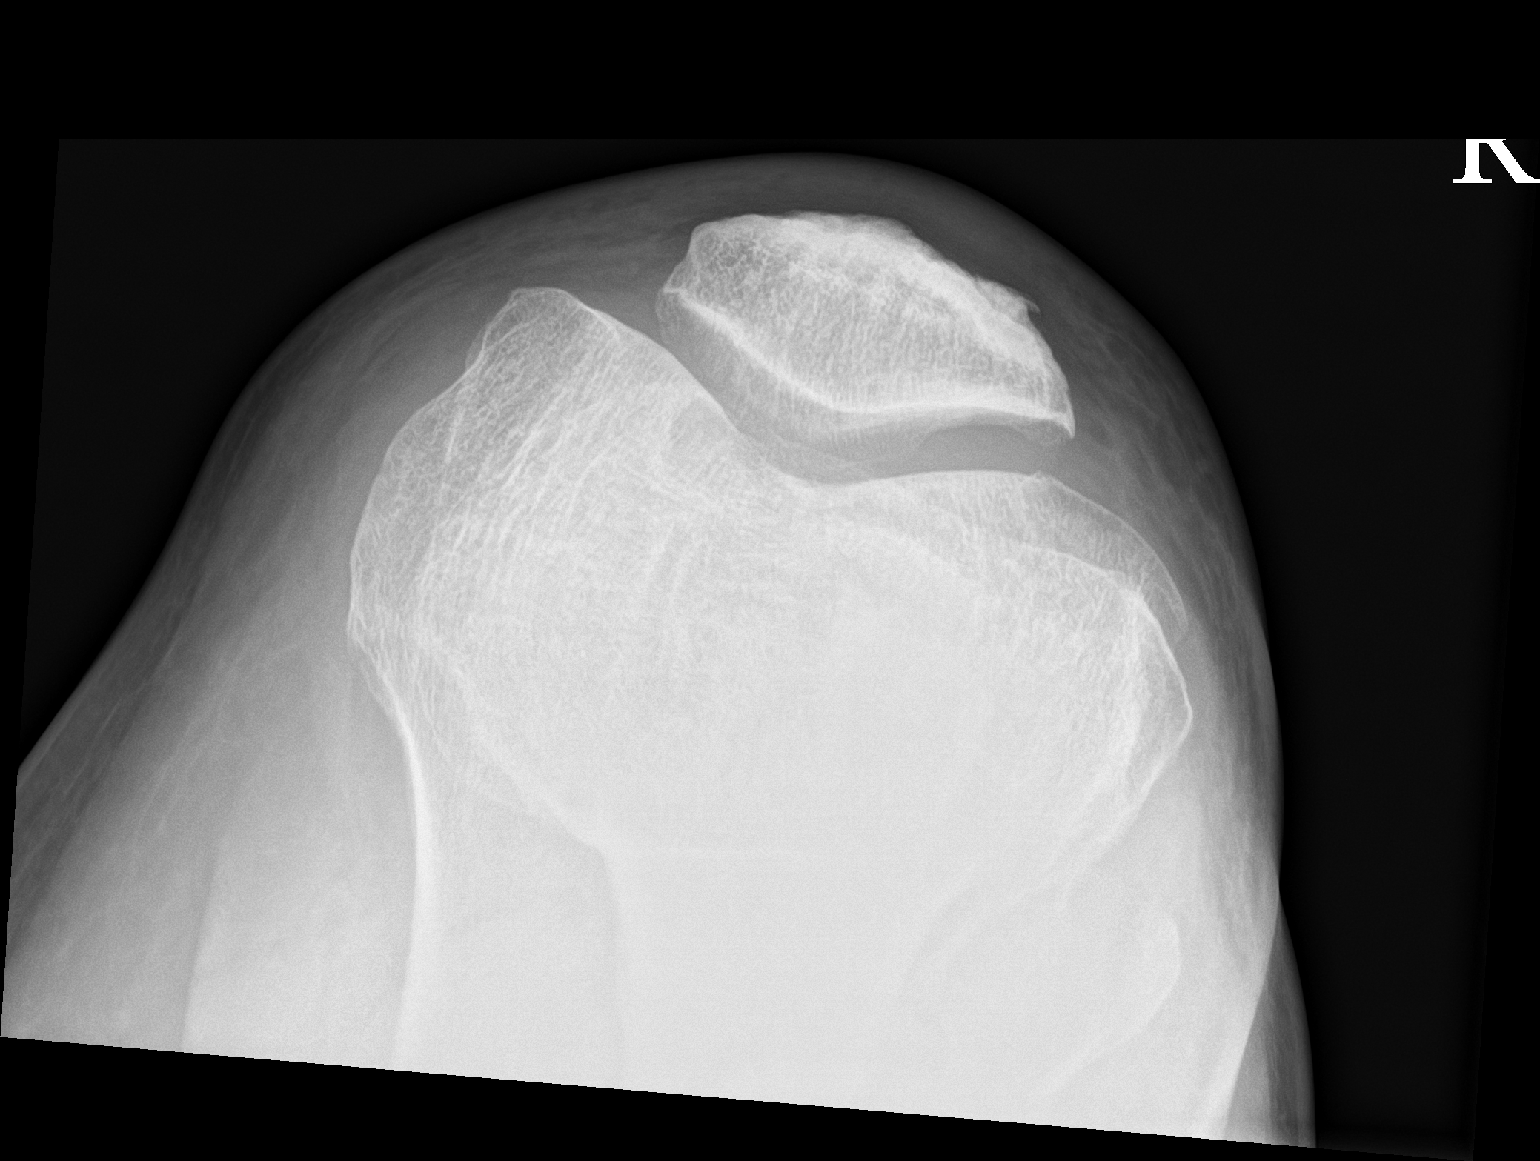

[3 of 3 positions shown; findings below may reference images not displayed]

FINDINGS: No fracture or dislocation of the right knee. There is mild
patellofemoral arthrosis with preserved medial and lateral
compartments. Small nonspecific knee joint effusion. Soft tissue
edema anteriorly.
IMPRESSION: 1.  No fracture or dislocation of the right knee.

2. There is mild patellofemoral arthrosis with preserved medial and
lateral compartments.

3.  Small nonspecific knee joint effusion.

4.  Soft tissue edema anteriorly.

## 2022-08-25 DIAGNOSIS — L600 Ingrowing nail: Secondary | ICD-10-CM | POA: Diagnosis not present

## 2022-08-28 DIAGNOSIS — G4733 Obstructive sleep apnea (adult) (pediatric): Secondary | ICD-10-CM | POA: Diagnosis not present

## 2022-09-08 DIAGNOSIS — L600 Ingrowing nail: Secondary | ICD-10-CM | POA: Diagnosis not present

## 2022-09-10 ENCOUNTER — Encounter (INDEPENDENT_AMBULATORY_CARE_PROVIDER_SITE_OTHER): Payer: Self-pay

## 2022-09-16 DIAGNOSIS — G4733 Obstructive sleep apnea (adult) (pediatric): Secondary | ICD-10-CM | POA: Diagnosis not present

## 2022-09-20 ENCOUNTER — Encounter: Payer: Self-pay | Admitting: Cardiovascular Disease

## 2022-09-20 NOTE — Progress Notes (Unsigned)
Cardiology Office Note   Date:  09/21/2022   ID:  Nicholas Hughes, DOB [birth year], MRN [id number]  PCP:  Mliss Sax, MD  Cardiologist:   Kristeen Miss, MD   Chief Complaint  Patient presents with   Hypertension        Atrial Fibrillation        Problem list: 1. Hypertension 2. Left ventricular Hypertrophy 3. Transient atrial fibrillation    Nicholas Hughes is a 52 y.o. male who presents for follow up of his HTN. He has not been having any CP or dyspnea. Eating better , does eat chicken wings every other Wednesday.   We performed an echo card gram back in 2011 which showed normal left ventricular systolic function. Mild mitral regurgitation.  No palpitations. Works for [employer] ( now has changed name to [employer] )   July 27, 2014:   Doing well . Reduced sex drive  Sept. 14, 4098: Doing well BP is a bit high. He's been working out on a regular basis. He still eats some extra salt ([place] bacon)   April 03, 2015: Still working at [employer] and also works in the [employer].   Has been overeating recently .    Has lost 10 labs since last year.  Has cut out his salt.   Eating lots of fruits.   Drinking protein shakes ( as a snack, not as a meal )  Is still trying to get his CPAP machine   March 27, 2016:  Doing well  BP has been well controlled  Eating better. Exercising well.    June 30, 2016:  Doing well Work is going well ,  BP has been well controlled Does not have a PCP   November 01, 2017: Doing well. Has not been to sleep yet today - working 2 jobs - his main job is moving to [place].  BP is a bit elevated today  Not working out as much . BP has been ok   Nov. 23, 2021 [facility] is seen today for follow up.  Hx of HTN. OSA ,  and PAF  Wants a referal to GI  No cp , no dyspnea.  Has started back exercising  Works at [employer].  Has tried to limit his carbs  Has been working with a Systems analyst.    May 19, 2021: Nicholas Hughes  is seen today for follow-up visit.  He has history of hypertension, obstructive sleep apnea, paroxysmal atrial fibrillation. No CP or dyspnea  Is getting some exercise  Works at [employer]    Aug. 26, 2024 Nicholas Hughes is seen for follow up of his HTN, OSA, PAF ,obesity Wt is 258 lbs  Is a Merchandiser, retail at [employer]      Past Medical History:  Diagnosis Date   Borderline diabetes    CKD (chronic kidney disease) stage 2, GFR 60-89 ml/min    ED (erectile dysfunction)    GERD (gastroesophageal reflux disease)    Hypertension    LVH (left ventricular hypertrophy)    OSA (obstructive sleep apnea) 07/14/2014   Moderate OSA with AHI 20/hr   Renal insufficiency    Transient atrial fibrillation or flutter     Past Surgical History:  Procedure Laterality Date   TRANSTHORACIC ECHOCARDIOGRAM  05/07/2009   EF 60-65%     Current Outpatient Medications  Medication Sig Dispense Refill   carvedilol (COREG) 12.5 MG tablet Take 1 tablet (12.5 mg total) by mouth 2 (two)  times daily with a meal. 180 tablet 1   diltiazem (CARDIZEM CD) 300 MG 24 hr capsule Take 1 capsule (300 mg total) by mouth daily. 90 capsule 0   empagliflozin (JARDIANCE) 10 MG TABS tablet Take 1 tablet (10 mg total) by mouth daily before breakfast. 30 tablet 5   potassium chloride SA (KLOR-CON M) 20 MEQ tablet Take 1 tablet (20 mEq total) by mouth daily. 90 tablet 3   triamterene-hydrochlorothiazide (MAXZIDE) 75-50 MG tablet Take 1 tablet by mouth daily. 90 tablet 0   No current facility-administered medications for this visit.    Allergies:   Patient has no known allergies.    Social History:  The patient  reports that he has never smoked. He has never used smokeless tobacco. He reports that he does not currently use alcohol. He reports that he does not use drugs.   Family History:  The patient's family history includes Coronary artery disease in his father; Hypertension in his  father.    ROS:  Please see the history of present illness.    Physical Exam: Blood pressure 128/70, pulse 69, height 6' (1.829 m), weight 258 lb 9.6 oz (117.3 kg), SpO2 98%.       GEN:  Well nourished, well developed in no acute distress HEENT: Normal NECK: No JVD; No carotid bruits LYMPHATICS: No lymphadenopathy CARDIAC: RRR , no murmurs, rubs, gallops RESPIRATORY:  Clear to auscultation without rales, wheezing or rhonchi  ABDOMEN: Soft, non-tender, non-distended MUSCULOSKELETAL:  No edema; No deformity  SKIN: Warm and dry NEUROLOGIC:  Alert and oriented x 3   EKG:          Recent Labs: 07/10/2022: ALT 18; BUN 15; Creatinine, Ser 1.29; Hemoglobin 14.1; Platelets 300.0; Potassium 3.6; Sodium 139    Lipid Panel    Component Value Date/Time   CHOL 154 07/10/2022 1026   TRIG 46.0 07/10/2022 1026   HDL 44.80 07/10/2022 1026   CHOLHDL 3 07/10/2022 1026   VLDL 9.2 07/10/2022 1026   LDLCALC 100 (H) 07/10/2022 1026      Wt Readings from Last 3 Encounters:  09/21/22 258 lb 9.6 oz (117.3 kg)  07/14/22 252 lb (114.3 kg)  07/10/22 253 lb 9.6 oz (115 kg)      Other studies Reviewed: Additional studies/ records that were reviewed today include: . Review of the above records demonstrates:    ASSESSMENT AND PLAN:  1. Hypertension-     blood pressure looks great.  Continue current medications.    2. Left ventricular Hypertrophy -    3. Transient atrial fibrillation-    he has not had any recurrent episodes of atrial fibrillation.  He has history of hypertension.  CHADS2VASC score  is 1. Will hold off on starting any anticoagulant at this point     4.   Suspected sleep apnea:          Current medicines are reviewed at length with the patient today.  The patient does not have concerns regarding medicines.     Disposition:   FU with me in  1 year    Kristeen Miss, MD  09/21/2022 4:26 PM    [facility] [address] [place], [place],  [place]  [address] Phone: [phone]; Fax: [phone]

## 2022-09-21 ENCOUNTER — Encounter: Payer: Self-pay | Admitting: Cardiovascular Disease

## 2022-09-21 ENCOUNTER — Ambulatory Visit: Payer: BC Managed Care – PPO | Attending: Cardiovascular Disease | Admitting: Cardiovascular Disease

## 2022-09-21 VITALS — BP 128/70 | HR 69 | Ht 72.0 in | Wt 258.6 lb

## 2022-09-21 DIAGNOSIS — I4891 Unspecified atrial fibrillation: Secondary | ICD-10-CM

## 2022-09-21 DIAGNOSIS — I10 Essential (primary) hypertension: Secondary | ICD-10-CM

## 2022-09-21 NOTE — Patient Instructions (Signed)
Medication Instructions:   Your physician recommends that you continue on your current medications as directed. Please refer to the Current Medication list given to you today.  *If you need a refill on your cardiac medications before your next appointment, please call your pharmacy*    Follow-Up: At [facility], you and your health needs are our priority.  As part of our continuing mission to provide you with exceptional heart care, we have created designated Provider Care Teams.  These Care Teams include your primary Cardiologist (physician) and Advanced Practice Providers (APPs -  Physician Assistants and Nurse Practitioners) who all work together to provide you with the care you need, when you need it.  We recommend signing up for the patient portal called "MyChart".  Sign up information is provided on this After Visit Summary.  MyChart is used to connect with patients for Virtual Visits (Telemedicine).  Patients are able to view lab/test results, encounter notes, upcoming appointments, etc.  Non-urgent messages can be sent to your provider as well.   To learn more about what you can do with MyChart, go to [email or web address].    Your next appointment:   1 year(s)  Provider:   Philip Nahser, MD       

## 2022-09-28 DIAGNOSIS — G4733 Obstructive sleep apnea (adult) (pediatric): Secondary | ICD-10-CM | POA: Diagnosis not present

## 2022-10-12 ENCOUNTER — Ambulatory Visit (INDEPENDENT_AMBULATORY_CARE_PROVIDER_SITE_OTHER): Payer: BC Managed Care – PPO | Admitting: Family Medicine

## 2022-10-12 ENCOUNTER — Encounter: Payer: Self-pay | Admitting: Family Medicine

## 2022-10-12 VITALS — BP 134/82 | HR 63 | Temp 97.7°F | Ht 72.0 in | Wt 254.6 lb

## 2022-10-12 DIAGNOSIS — E7800 Pure hypercholesterolemia, unspecified: Secondary | ICD-10-CM | POA: Diagnosis not present

## 2022-10-12 DIAGNOSIS — I10 Essential (primary) hypertension: Secondary | ICD-10-CM | POA: Diagnosis not present

## 2022-10-12 DIAGNOSIS — Z23 Encounter for immunization: Secondary | ICD-10-CM | POA: Diagnosis not present

## 2022-10-12 DIAGNOSIS — R7303 Prediabetes: Secondary | ICD-10-CM

## 2022-10-12 LAB — BASIC METABOLIC PANEL
BUN: 20 mg/dL (ref 6–23)
CO2: 32 meq/L (ref 19–32)
Calcium: 9.5 mg/dL (ref 8.4–10.5)
Chloride: 101 meq/L (ref 96–112)
Creatinine, Ser: 1.32 mg/dL (ref 0.40–1.50)
GFR: 62.21 mL/min (ref >60.00)
Glucose, Bld: 64 mg/dL — ABNORMAL LOW (ref 70–99)
Potassium: 3.8 meq/L (ref 3.5–5.1)
Sodium: 140 mEq/L (ref 135–145)

## 2022-10-12 LAB — HEMOGLOBIN A1C: Hgb A1c MFr Bld: 6.3 % (ref 4.6–6.5)

## 2022-10-12 MED ORDER — EMPAGLIFLOZIN 25 MG PO TABS: 25.0000 mg | ORAL_TABLET | Freq: Every day | ORAL | 1 refills | Status: DC

## 2022-10-12 NOTE — Progress Notes (Addendum)
Established Patient Office Visit   Subjective:  Patient ID: Nicholas Hughes, male    DOB: [birth year]  Age: 52 y.o. MRN: [id number]  Chief Complaint  Patient presents with   Medical Management of Chronic Issues    3 month follow up. Pt is not fasting.     HPI Encounter Diagnoses  Name Primary?   Essential hypertension Yes   Need for shingles vaccine    Pre-diabetes    Elevated cholesterol    Follow-up as above.  Blood pressure well-controlled with below.  Continues empagliflozin for prediabetes.  Last LDL was 100.  A1c was up to 6.4.  Status post visit with cardiology.  Has improved diet with less salt and sugar.  He is working out regularly.  He is working on losing weight.   Review of Systems  Constitutional: Negative.   HENT: Negative.    Eyes:  Negative for blurred vision, discharge and redness.  Respiratory: Negative.    Cardiovascular: Negative.   Gastrointestinal:  Negative for abdominal pain.  Genitourinary: Negative.   Musculoskeletal: Negative.  Negative for myalgias.  Skin:  Negative for rash.  Neurological:  Negative for tingling, loss of consciousness and weakness.  Endo/Heme/Allergies:  Negative for polydipsia.     Current Outpatient Medications:    carvedilol (COREG) 12.5 MG tablet, Take 1 tablet (12.5 mg total) by mouth 2 (two) times daily with a meal., Disp: 180 tablet, Rfl: 1   diltiazem (CARDIZEM CD) 300 MG 24 hr capsule, Take 1 capsule (300 mg total) by mouth daily., Disp: 90 capsule, Rfl: 0   empagliflozin (JARDIANCE) 10 MG TABS tablet, Take 1 tablet (10 mg total) by mouth daily before breakfast., Disp: 30 tablet, Rfl: 5   potassium chloride SA (KLOR-CON M) 20 MEQ tablet, Take 1 tablet (20 mEq total) by mouth daily., Disp: 90 tablet, Rfl: 3   triamterene-hydrochlorothiazide (MAXZIDE) 75-50 MG tablet, Take 1 tablet by mouth daily., Disp: 90 tablet, Rfl: 0   Objective:     BP 134/82   Pulse 63   Temp 97.7 F (36.5 C)   Ht 6' (1.829 m)   Wt 254  lb 9.6 oz (115.5 kg)   SpO2 97%   BMI 34.53 kg/m  BP Readings from Last 3 Encounters:  10/12/22 134/82  09/21/22 128/70  07/14/22 118/80   Wt Readings from Last 3 Encounters:  10/12/22 254 lb 9.6 oz (115.5 kg)  09/21/22 258 lb 9.6 oz (117.3 kg)  07/14/22 252 lb (114.3 kg)      Physical Exam Constitutional:      General: He is not in acute distress.    Appearance: Normal appearance. He is not ill-appearing, toxic-appearing or diaphoretic.  HENT:     Head: Normocephalic and atraumatic.     Right Ear: External ear normal.     Left Ear: External ear normal.  Eyes:     General: No scleral icterus.       Right eye: No discharge.        Left eye: No discharge.     Extraocular Movements: Extraocular movements intact.     Conjunctiva/sclera: Conjunctivae normal.  Cardiovascular:     Rate and Rhythm: Normal rate and regular rhythm.  Pulmonary:     Effort: Pulmonary effort is normal. No respiratory distress.     Breath sounds: Normal breath sounds.  Abdominal:     General: Bowel sounds are normal.     Tenderness: There is no abdominal tenderness. There is no guarding.  Musculoskeletal:  Cervical back: No rigidity or tenderness.  Skin:    General: Skin is warm and dry.  Neurological:     Mental Status: He is alert and oriented to person, place, and time.  Psychiatric:        Mood and Affect: Mood normal.        Behavior: Behavior normal.      No results found for any visits on 10/12/22.    The 10-year ASCVD risk score (Arnett DK, et al., 2019) is: 10%    Assessment & Plan:   Essential hypertension -     Basic metabolic panel  Need for shingles vaccine -     Varicella-zoster vaccine IM  Pre-diabetes -     Basic metabolic panel -     Hemoglobin A1c  Elevated cholesterol    Return in about 6 months (around 04/11/2023).  Discussed using metformin with empagliflozin for better control of prediabetes.  Information was given on metformin.  Information was  given on preventing high cholesterol.  Blood pressure not well-controlled.  Continue weight loss efforts.  Second Shingrix vaccine today.  Mliss Sax, MD  9/16 addendum: Increased empagliflozin to 25 mg daily.

## 2022-10-12 NOTE — Addendum Note (Signed)
Addended by: Andrez Grime on: 10/12/2022 03:40 PM   Modules accepted: Orders

## 2022-10-13 NOTE — Progress Notes (Unsigned)
HPI M never smoker followed for OSA, complicated by HTN, PAFib, GERD, CKD2,  NPSG 07/02/14- AHI 20/ hr, desaturation to 78%, body weight 247 lbs HST 05/08/22- AHI 31.6/ hr, desaturation to 69%, body weight 262 lbs  =======================================================   07/14/22- 51 yoM never smoker followed for OSA, complicated by HTN, PAFib, GERD, CKD2,  HST 05/08/22- AHI 31.6/ hr, desaturation to 69%, body weight 262 lbs Body weight today- ? Needs new CPAP order? Sleep study reviewed and options discussed.  We are going to order new CPAP auto 5-20.  10/15/22- 51 yoM never smoker followed for OSA, complicated by HTN, PAFib, GERD, CKD2, CKD2,  CPAP auto 5-20/ [employer]- ordered 07/14/22 Download compliance Body weight today-    ROS-see HPI   += positive Constitutional:    weight loss, night sweats, fevers, chills, fatigue, lassitude. HEENT:    headaches, difficulty swallowing, tooth/dental problems, sore throat,       sneezing, itching, ear ache, nasal congestion, post nasal drip, snoring CV:    chest pain, orthopnea, PND, swelling in lower extremities, anasarca,                                   dizziness, palpitations Resp:   shortness of breath with exertion or at rest.                productive cough,   non-productive cough, coughing up of blood.              change in color of mucus.  wheezing.   Skin:    rash or lesions. GI:  No-   heartburn, indigestion, abdominal pain, nausea, vomiting, diarrhea,                 change in bowel habits, loss of appetite GU: dysuria, change in color of urine, no urgency or frequency.   flank pain. MS:   joint pain, stiffness, decreased range of motion, back pain. Neuro-     nothing unusual Psych:  change in mood or affect.  depression or anxiety.   memory loss.  OBJ- Physical Exam General- Alert, Oriented, Affect-appropriate, Distress- none acute, +muscular/overweight Skin- rash-none, lesions- none, excoriation- none Lymphadenopathy-  none Head- atraumatic            Eyes- Gross vision intact, PERRLA, conjunctivae and secretions clear            Ears- Hearing, canals-normal            Nose- Clear, no-Septal dev, mucus, polyps, erosion, perforation             Throat- Mallampati IV , mucosa clear , drainage- none, tonsils+, +teeth Neck- flexible , trachea midline, no stridor , thyroid nl, carotid no bruit Chest - symmetrical excursion , unlabored           Heart/CV- RRR , no murmur , no gallop  , no rub, nl s1 s2                           - JVD- none , edema- none, stasis changes- none, varices- none           Lung- clear to P&A, wheeze- none, cough- none , dullness-none, rub- none           Chest wall-  Abd-  Br/ Gen/ Rectal- Not done, not indicated Extrem- cyanosis- none, clubbing, none, atrophy- none, strength- nl  Neuro- grossly intact to observation

## 2022-10-15 ENCOUNTER — Encounter: Payer: Self-pay | Admitting: Internal Medicine

## 2022-10-15 ENCOUNTER — Ambulatory Visit (INDEPENDENT_AMBULATORY_CARE_PROVIDER_SITE_OTHER): Payer: BC Managed Care – PPO | Admitting: Internal Medicine

## 2022-10-15 VITALS — BP 122/74 | HR 71 | Temp 97.6°F | Ht 72.0 in | Wt 250.2 lb

## 2022-10-15 DIAGNOSIS — G4733 Obstructive sleep apnea (adult) (pediatric): Secondary | ICD-10-CM | POA: Diagnosis not present

## 2022-10-15 DIAGNOSIS — F5101 Primary insomnia: Secondary | ICD-10-CM

## 2022-10-15 DIAGNOSIS — G4700 Insomnia, unspecified: Secondary | ICD-10-CM | POA: Insufficient documentation

## 2022-10-15 MED ORDER — TEMAZEPAM 15 MG PO CAPS: ORAL_CAPSULE | ORAL | 2 refills | Status: DC

## 2022-10-15 NOTE — Assessment & Plan Note (Signed)
Emphasis on good sleep hygiene and appropriate accommodations for third shift work, while that lasts. Plan-try temazepam 15 or 30 mg.

## 2022-10-15 NOTE — Patient Instructions (Signed)
Script sent to try temazepam 15 mg- you can take one or two caps for sleep as needed.  Keep trying to make your CPAP work

## 2022-10-15 NOTE — Assessment & Plan Note (Signed)
Emphasis on comfort and compliance.  If he can use it more we will be able to better assess effectiveness of control. Plan-continue for now auto 5-20

## 2022-10-31 DIAGNOSIS — G4733 Obstructive sleep apnea (adult) (pediatric): Secondary | ICD-10-CM | POA: Diagnosis not present

## 2022-12-01 DIAGNOSIS — G4733 Obstructive sleep apnea (adult) (pediatric): Secondary | ICD-10-CM | POA: Diagnosis not present

## 2022-12-13 ENCOUNTER — Other Ambulatory Visit: Payer: Self-pay | Admitting: Family Medicine

## 2022-12-13 DIAGNOSIS — I10 Essential (primary) hypertension: Secondary | ICD-10-CM

## 2022-12-13 DIAGNOSIS — I4891 Unspecified atrial fibrillation: Secondary | ICD-10-CM

## 2022-12-30 ENCOUNTER — Other Ambulatory Visit: Payer: Self-pay | Admitting: Family Medicine

## 2022-12-30 DIAGNOSIS — I10 Essential (primary) hypertension: Secondary | ICD-10-CM

## 2022-12-31 DIAGNOSIS — G4733 Obstructive sleep apnea (adult) (pediatric): Secondary | ICD-10-CM | POA: Diagnosis not present

## 2023-01-03 ENCOUNTER — Emergency Department (HOSPITAL_COMMUNITY)
Admission: EM | Admit: 2023-01-03 | Discharge: 2023-01-03 | Disposition: A | Discharge status: Home / Self Care | Payer: BC Managed Care – PPO | Attending: Emergency Medicine | Admitting: Emergency Medicine

## 2023-01-03 ENCOUNTER — Other Ambulatory Visit: Payer: Self-pay

## 2023-01-03 DIAGNOSIS — Y92410 Unspecified street and highway as the place of occurrence of the external cause: Secondary | ICD-10-CM | POA: Diagnosis not present

## 2023-01-03 DIAGNOSIS — S161XXA Strain of muscle, fascia and tendon at neck level, initial encounter: Secondary | ICD-10-CM | POA: Diagnosis not present

## 2023-01-03 DIAGNOSIS — Z79899 Other long term (current) drug therapy: Secondary | ICD-10-CM | POA: Insufficient documentation

## 2023-01-03 DIAGNOSIS — S199XXA Unspecified injury of neck, initial encounter: Secondary | ICD-10-CM | POA: Diagnosis not present

## 2023-01-03 MED ORDER — METHOCARBAMOL 500 MG PO TABS: 500.0000 mg | ORAL_TABLET | Freq: Two times a day (BID) | ORAL | 0 refills | Status: DC

## 2023-01-03 MED ORDER — KETOROLAC TROMETHAMINE 60 MG/2ML IM SOLN
60.0000 mg | Freq: Once | INTRAMUSCULAR | Status: AC
  Administered 2023-01-03: 60 mg via INTRAMUSCULAR
  Filled 2023-01-03: qty 2
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
## 2023-01-14 ENCOUNTER — Ambulatory Visit: Payer: BC Managed Care – PPO | Admitting: Internal Medicine

## 2023-02-07 NOTE — Progress Notes (Signed)
 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  He is waking every couple of hours and takes his CPAP off.  Says mask is comfortable.  In the last 3 months he has started working third shift which is contributing to his sleep problems.  We talked about adjustments for bedroom environment. We are going to try helping from an insomnia standpoint.  If CPAP compliance remains difficult we will look at alternative therapies. He speaks to trying to get his weight down.  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 He admits he can't make CPAP work for him- wakes ever hour or so. Sleeps better without it. We discussed sleep apnea goals and treatment options, including oral appliance, Inspire, or semaglutide/terzepatide can come from his PCP.  Discussed the use of AI scribe software for clinical note transcription with the patient, who gave verbal consent to proceed.  History of Present Illness   The patient, with a history of moderate to severe sleep apnea, reports sleeping better without the CPAP machine. He describes waking up every hour when using the machine, compared to sleeping for four to five hours without it. He acknowledges snoring, particularly when he is very tired. He is currently living  alone, but when he has a friend over, he has been told that he snores. He has had two sleep studies, the most recent of which was last year, showing an increase in the severity of his sleep apnea. He also mentions a noticeable asymmetry in his body fat distribution, with one side appearing puffier than the other. He hopes to get back to the gym and trying to lose weight.     ROS-see HPI   += positive Constitutional:    weight loss, night sweats, fevers, chills, fatigue, lassitude. HEENT:    headaches, difficulty swallowing, tooth/dental problems, sore throat,       sneezing, itching, ear ache, nasal congestion, post nasal drip, snoring CV:    chest pain, orthopnea, PND, swelling in lower extremities, anasarca,                                   dizziness, palpitations Resp:   shortness of breath with exertion or at rest.                productive cough,   non-productive cough, coughing up of blood.              change in color of mucus.  wheezing.   Skin:    rash or lesions. GI:  No-   heartburn, indigestion, abdominal pain, nausea, vomiting, diarrhea,                 change in bowel habits, loss of appetite GU: dysuria, change in color of urine, no urgency or frequency.  flank pain. MS:   joint pain, stiffness, decreased range of motion, back pain. Neuro-     nothing unusual Psych:  change in mood or affect.  depression or anxiety.   memory loss.  OBJ- Physical Exam General- Alert, Oriented, Affect-appropriate, Distress- none acute, +muscular/overweight Skin- rash-none, lesions- none, excoriation- none Lymphadenopathy- none Head- atraumatic            Eyes- Gross vision intact, PERRLA, conjunctivae and secretions clear            Ears- Hearing, canals-normal            Nose- Clear, no-Septal dev, mucus, polyps, erosion, perforation             Throat- Mallampati IV , mucosa clear , drainage- none, tonsils+, +teeth Neck- flexible , trachea midline, no stridor , thyroid  nl, carotid no  bruit Chest - symmetrical excursion , unlabored           Heart/CV- RRR , no murmur , no gallop  , no rub, nl s1 s2                           - JVD- none , edema- none, stasis changes- none, varices- none           Lung- clear to P&A, wheeze- none, cough- none , dullness-none, rub- none           Chest wall-  Abd-  Br/ Gen/ Rectal- Not done, not indicated Extrem- cyanosis- none, clubbing, none, atrophy- none, strength- nl Neuro- grossly intact to observation  Assessment and Plan    Obstructive Sleep Apnea (OSA) Patient reports better sleep without CPAP machine. Previous sleep studies showed moderate to severe OSA. Discussed the importance of treatment for OSA despite lack of symptoms, similar to hypertension. Offered repeat home sleep test, but last test was only a year ago. -Refer to Dr. Oneil Forget for evaluation for oral appliance therapy. -Consider referral for Inspire therapy evaluation if oral appliance therapy is not successful or not desired by the patient.  Weight Management Patient reports asymmetrical weight gain and recent hypertensive episode after eating barbecue. Discussed the role of weight loss in managing OSA. -Recommend discussing weight loss strategies and potential use of weight loss medications with primary care provider, Dr. Tess.  Follow-up in 1 year or sooner if patient has additional concerns or wishes to proceed with other treatment evaluations.

## 2023-02-09 ENCOUNTER — Encounter: Payer: Self-pay | Admitting: Internal Medicine

## 2023-02-09 ENCOUNTER — Ambulatory Visit: Payer: BC Managed Care – PPO | Admitting: Internal Medicine

## 2023-02-09 VITALS — BP 130/82 | HR 60 | Temp 98.3°F | Ht 72.0 in | Wt 247.6 lb

## 2023-02-09 DIAGNOSIS — G4733 Obstructive sleep apnea (adult) (pediatric): Secondary | ICD-10-CM | POA: Diagnosis not present

## 2023-02-09 NOTE — Patient Instructions (Signed)
 Order- referral to orthodontist Dr Althea Grimmer   - consider oral appliance for OSA

## 2023-02-12 ENCOUNTER — Telehealth: Payer: Self-pay | Admitting: Family Medicine

## 2023-02-12 NOTE — Telephone Encounter (Signed)
 ERROR

## 2023-02-19 ENCOUNTER — Telehealth: Payer: Self-pay | Admitting: Family Medicine

## 2023-02-19 NOTE — Telephone Encounter (Signed)
error

## 2023-02-25 ENCOUNTER — Encounter: Payer: Self-pay | Admitting: Family Medicine

## 2023-02-25 ENCOUNTER — Ambulatory Visit (INDEPENDENT_AMBULATORY_CARE_PROVIDER_SITE_OTHER): Payer: BC Managed Care – PPO | Admitting: Family Medicine

## 2023-02-25 VITALS — BP 152/100 | HR 60 | Temp 98.9°F | Ht 72.0 in | Wt 245.8 lb

## 2023-02-25 DIAGNOSIS — R7303 Prediabetes: Secondary | ICD-10-CM

## 2023-02-25 DIAGNOSIS — Z1211 Encounter for screening for malignant neoplasm of colon: Secondary | ICD-10-CM

## 2023-02-25 DIAGNOSIS — M25561 Pain in right knee: Secondary | ICD-10-CM

## 2023-02-25 DIAGNOSIS — I10 Essential (primary) hypertension: Secondary | ICD-10-CM

## 2023-02-25 DIAGNOSIS — K5901 Slow transit constipation: Secondary | ICD-10-CM

## 2023-02-25 NOTE — Progress Notes (Signed)
Established Patient Office Visit   Subjective:  Patient ID: Nicholas Hughes, male    DOB: 04-11-1970  Age: 53 y.o. MRN: [id number]  Chief Complaint  Patient presents with   Hypertension    Follow up, concerns with constipation, right knee pain flare up    Hypertension Pertinent negatives include no blurred vision.   Encounter Diagnoses  Name Primary?   Right knee pain, unspecified chronicity Yes   Screening for colon cancer    Slow transit constipation    Pre-diabetes    Essential hypertension    For follow-up of above.  Blood pressure at home typically runs in the 130/80 range.  It does tend to spike with salt loads.  He has been able to lose about 20 pounds.  He has been having trouble with constipation.  Normally with stool twice daily but over the last week or so he now is stooling a little bit less than once daily.  Stool content is small.  There is no bladder dark tarry stool.  Needs referral for colonoscopy.  He is seeing sports medicine for right knee pain.  Knee continues to swell with stiffness and discomfort.  Denies recent injury.   Review of Systems  Constitutional: Negative.   HENT: Negative.    Eyes:  Negative for blurred vision, discharge and redness.  Respiratory: Negative.    Cardiovascular: Negative.   Gastrointestinal:  Positive for constipation. Negative for abdominal pain, blood in stool and melena.  Genitourinary: Negative.   Musculoskeletal:  Positive for joint pain. Negative for myalgias.  Skin:  Negative for rash.  Neurological:  Negative for tingling, loss of consciousness and weakness.  Endo/Heme/Allergies:  Negative for polydipsia.     Current Outpatient Medications:    carvedilol (COREG) 12.5 MG tablet, Take 1 tablet (12.5 mg total) by mouth 2 (two) times daily with a meal., Disp: 180 tablet, Rfl: 1   diltiazem (CARDIZEM CD) 300 MG 24 hr capsule, Take 1 capsule by mouth once daily, Disp: 90 capsule, Rfl: 0   empagliflozin (JARDIANCE) 25 MG  TABS tablet, Take 1 tablet (25 mg total) by mouth daily., Disp: 90 tablet, Rfl: 1   potassium chloride SA (KLOR-CON M) 20 MEQ tablet, Take 1 tablet (20 mEq total) by mouth daily., Disp: 90 tablet, Rfl: 3   triamterene-hydrochlorothiazide (MAXZIDE) 75-50 MG tablet, Take 1 tablet by mouth once daily, Disp: 90 tablet, Rfl: 0   temazepam (RESTORIL) 15 MG capsule, 1 or 2 caps for sleep as needed (Patient not taking: Reported on 02/25/2023), Disp: 45 capsule, Rfl: 2   Objective:     BP (!) 152/100 (BP Location: Right Arm, Patient Position: Sitting)   Pulse 60   Temp 98.9 F (37.2 C)   Ht 6' (1.829 m)   Wt 245 lb 12.8 oz (111.5 kg)   SpO2 98%   BMI 33.34 kg/m  Wt Readings from Last 3 Encounters:  02/25/23 245 lb 12.8 oz (111.5 kg)  02/09/23 247 lb 9.6 oz (112.3 kg)  01/03/23 243 lb (110.2 kg)      Physical Exam Constitutional:      General: He is not in acute distress.    Appearance: Normal appearance. He is not ill-appearing, toxic-appearing or diaphoretic.  HENT:     Head: Normocephalic and atraumatic.     Right Ear: External ear normal.     Left Ear: External ear normal.  Eyes:     General: No scleral icterus.       Right eye: No discharge.  Left eye: No discharge.     Extraocular Movements: Extraocular movements intact.     Conjunctiva/sclera: Conjunctivae normal.  Cardiovascular:     Rate and Rhythm: Normal rate and regular rhythm.  Pulmonary:     Effort: Pulmonary effort is normal. No respiratory distress.     Breath sounds: Normal breath sounds.  Abdominal:     General: Bowel sounds are normal.     Tenderness: There is no abdominal tenderness. There is no guarding or rebound.  Musculoskeletal:     Cervical back: No rigidity or tenderness.     Right knee: Swelling and effusion (Small) present. No tenderness.  Skin:    General: Skin is warm and dry.  Neurological:     Mental Status: He is alert and oriented to person, place, and time.  Psychiatric:        Mood  and Affect: Mood normal.        Behavior: Behavior normal.      No results found for any visits on 02/25/23.    The 10-year ASCVD risk score (Arnett DK, et al., 2019) is: 12.6%    Assessment & Plan:   Right knee pain, unspecified chronicity -     Ambulatory referral to Orthopedic Surgery  Screening for colon cancer -     Ambulatory referral to Gastroenterology  Slow transit constipation  Pre-diabetes  Essential hypertension    Return in about 6 weeks (around 04/08/2023) for chronic disease follow-up.  Will continue to check and record blood pressures periodically.  Discussed lowering sodium in the diet.  Information given on managing hypertension.  Will use MiraLAX for constipation.  Rereferral for colonoscopy through GI.  Hold off on GLP-1 agonist therapy for weight loss for now.  Orthopedic referral for ongoing pain in the right knee with swelling and stiffness  Mliss Sax, MD

## 2023-03-04 DIAGNOSIS — L218 Other seborrheic dermatitis: Secondary | ICD-10-CM | POA: Diagnosis not present

## 2023-03-04 DIAGNOSIS — L720 Epidermal cyst: Secondary | ICD-10-CM | POA: Diagnosis not present

## 2023-03-04 DIAGNOSIS — D485 Neoplasm of uncertain behavior of skin: Secondary | ICD-10-CM | POA: Diagnosis not present

## 2023-03-11 DIAGNOSIS — L905 Scar conditions and fibrosis of skin: Secondary | ICD-10-CM | POA: Diagnosis not present

## 2023-03-22 ENCOUNTER — Encounter: Payer: Self-pay | Admitting: Internal Medicine

## 2023-03-24 ENCOUNTER — Other Ambulatory Visit: Payer: Self-pay | Admitting: Family Medicine

## 2023-03-24 DIAGNOSIS — I10 Essential (primary) hypertension: Secondary | ICD-10-CM

## 2023-03-24 DIAGNOSIS — I4891 Unspecified atrial fibrillation: Secondary | ICD-10-CM

## 2023-03-25 ENCOUNTER — Other Ambulatory Visit: Payer: Self-pay | Admitting: Family Medicine

## 2023-03-25 DIAGNOSIS — I10 Essential (primary) hypertension: Secondary | ICD-10-CM

## 2023-04-08 ENCOUNTER — Ambulatory Visit (INDEPENDENT_AMBULATORY_CARE_PROVIDER_SITE_OTHER)

## 2023-04-08 ENCOUNTER — Ambulatory Visit: Admitting: Family Medicine

## 2023-04-08 ENCOUNTER — Other Ambulatory Visit: Payer: Self-pay

## 2023-04-08 ENCOUNTER — Encounter: Payer: Self-pay | Admitting: Family Medicine

## 2023-04-08 VITALS — BP 136/84 | HR 65 | Ht 72.0 in | Wt 257.0 lb

## 2023-04-08 DIAGNOSIS — M1711 Unilateral primary osteoarthritis, right knee: Secondary | ICD-10-CM | POA: Diagnosis not present

## 2023-04-08 DIAGNOSIS — M25561 Pain in right knee: Secondary | ICD-10-CM

## 2023-04-08 DIAGNOSIS — G8929 Other chronic pain: Secondary | ICD-10-CM

## 2023-04-08 DIAGNOSIS — M799 Soft tissue disorder, unspecified: Secondary | ICD-10-CM | POA: Diagnosis not present

## 2023-04-08 DIAGNOSIS — M25461 Effusion, right knee: Secondary | ICD-10-CM | POA: Diagnosis not present

## 2023-04-08 NOTE — Patient Instructions (Signed)
 Thank you for coming in today.  You received an injection today. Seek immediate medical attention if the joint becomes red, extremely painful, or is oozing fluid.  Please get an Xray today before you leave

## 2023-04-08 NOTE — Progress Notes (Unsigned)
 I, Stevenson Clinch, CMA acting as a scribe for Nicholas Graham, MD.  [name] is a 53 y.o. male who presents to [facility] today for R knee pain. Pt was previously seen by Dr. Denyse Amass in 2022 for L triceps tendinopathy.  Today, pt c/o R knee pain x > 1 month, no known injury. Pt locates pain to distal to patella. Sx worse with WB, ambulation, sit-to-stand. Swelling present. Does not feel unstable. Sx have improved some since onset. Has tried ice, TENS, elevation. Notes that pain is worse when the knee is fully extended. Last XR of the knee done 11/2020. He had steroid injection in 2022 in that right knee which did help until recently.  R Knee swelling: present Mechanical symptoms: occasional popping Aggravates: as above Treatments tried: ice, TENS  Pertinent review of systems: No fevers or chills  Relevant historical information: Hypertension and atrial fibrillation.   Exam:  BP 136/84   Pulse 65   Ht 6' (1.829 m)   Wt 257 lb (116.6 kg)   SpO2 98%   BMI 34.86 kg/m  General: Well Developed, well nourished, and in no acute distress.   MSK: Right knee large effusion decreased flexion range of motion intact strength.  Stable ligamentous exam tender palpation medial joint line.    Lab and Radiology Results  Procedure: Real-time Ultrasound Guided Injection of right knee joint superior lateral patella space Device: Philips Affiniti 50G/GE Logiq Images permanently stored and available for review in PACS Verbal informed consent obtained.  Discussed risks and benefits of procedure. Warned about infection, bleeding, hyperglycemia damage to structures among others. Patient expresses understanding and agreement Time-out conducted.   Noted no overlying erythema, induration, or other signs of local infection.   Skin prepped in a sterile fashion.   Local anesthesia: Topical Ethyl chloride.   With sterile technique and under real time ultrasound  guidance: 40 mg of Kenalog and 2 mL of Marcaine injected into knee joint. Fluid seen entering the joint capsule.   Completed without difficulty   Pain immediately resolved suggesting accurate placement of the medication.   Advised to call if fevers/chills, erythema, induration, drainage, or persistent bleeding.   Images permanently stored and available for review in the ultrasound unit.  Impression: Technically successful ultrasound guided injection.   X-ray images right knee obtained today personally and independently interpreted. Mild medial DJD.  Medial patellofemoral DJD. Await formal radiology review     Assessment and Plan: 53 y.o. male with chronic right knee pain due to DJD exacerbation.  Patient additionally has an effusion.  Plan for steroid injection.  Continue Voltaren gel as needed.  Check back if not improving.   PDMP not reviewed this encounter. Orders Placed This Encounter  Procedures   [place] LIMITED JOINT SPACE STRUCTURES LOW RIGHT(NO LINKED CHARGES)    Reason for Exam (SYMPTOM  OR DIAGNOSIS REQUIRED):   right knee pain    Preferred imaging location?:   [facility] Knee AP/LAT W/Sunrise Right    Standing Status:   Future    Number of Occurrences:   1    Expiration Date:   05/09/2023    Reason for Exam (SYMPTOM  OR DIAGNOSIS REQUIRED):   right knee pain    Preferred imaging location?:   [facility]   No orders of the defined types were placed in this encounter.    Discussed warning signs or symptoms. Please see discharge instructions. Patient expresses understanding.  The above documentation has been reviewed and is accurate and complete Nicholas Hughes, M.D.

## 2023-04-09 ENCOUNTER — Ambulatory Visit (AMBULATORY_SURGERY_CENTER): Payer: BC Managed Care – PPO | Admitting: *Deleted

## 2023-04-09 VITALS — Ht 72.0 in | Wt 254.0 lb

## 2023-04-09 DIAGNOSIS — M1711 Unilateral primary osteoarthritis, right knee: Secondary | ICD-10-CM | POA: Insufficient documentation

## 2023-04-09 DIAGNOSIS — Z1211 Encounter for screening for malignant neoplasm of colon: Secondary | ICD-10-CM

## 2023-04-09 MED ORDER — SUFLAVE 178.7 G PO SOLR: 1.0000 | Freq: Once | ORAL | 0 refills | Status: AC

## 2023-04-09 NOTE — Progress Notes (Addendum)
 Pt's name and DOB verified at the beginning of the pre-visit wit 2 identifiers  Pt denies any difficulty with ambulating,sitting, laying down or rolling side to side  Pt has no issues with ambulation   Pt has no issues moving head neck or swallowing  No egg or soy allergy known to patient   Patient denies ever being intubated  No FH of Malignant Hyperthermia  Pt is not on diet pills or shots  Pt is not on home 02   Pt is not on blood thinners   Pt denies issues with constipation   Pt is not on dialysis  Pt has hx of Afib  Pt denies any upcoming cardiac testing  Patient's chart reviewed by Cathlyn Parsons CNRA prior to pre-visit and patient appropriate for the [facility].  Pre-visit completed and red dot placed by patient's name on their procedure day (on provider's schedule).    Visit by phone  Pt states weight is 254 lb  IInstructions reviewed. Pt given [facility], [facility] main # and MD on call # prior to instructions.  Pt states understanding of instructions. Instructed to review again prior to procedure. Pt states they will.   Informed pt that they will receive a text or  call from [facility] regarding there prep med.

## 2023-04-12 ENCOUNTER — Ambulatory Visit: Payer: BC Managed Care – PPO | Admitting: Family Medicine

## 2023-04-13 ENCOUNTER — Ambulatory Visit: Payer: BC Managed Care – PPO | Admitting: Family Medicine

## 2023-04-19 ENCOUNTER — Ambulatory Visit: Admitting: Family Medicine

## 2023-04-22 ENCOUNTER — Encounter: Payer: Self-pay | Admitting: Internal Medicine

## 2023-04-26 ENCOUNTER — Encounter: Payer: Self-pay | Admitting: Family Medicine

## 2023-04-26 NOTE — Progress Notes (Signed)
 Right knee x-ray shows mild arthritis.

## 2023-04-27 ENCOUNTER — Encounter: Payer: Self-pay | Admitting: Internal Medicine

## 2023-04-27 ENCOUNTER — Ambulatory Visit: Payer: BC Managed Care – PPO | Admitting: Internal Medicine

## 2023-04-27 VITALS — BP 135/94 | HR 64 | Temp 98.1°F | Resp 13 | Ht 72.0 in | Wt 254.0 lb

## 2023-04-27 DIAGNOSIS — D120 Benign neoplasm of cecum: Secondary | ICD-10-CM

## 2023-04-27 DIAGNOSIS — K6389 Other specified diseases of intestine: Secondary | ICD-10-CM | POA: Diagnosis not present

## 2023-04-27 DIAGNOSIS — K648 Other hemorrhoids: Secondary | ICD-10-CM

## 2023-04-27 DIAGNOSIS — Z1211 Encounter for screening for malignant neoplasm of colon: Secondary | ICD-10-CM

## 2023-04-27 MED ORDER — SODIUM CHLORIDE 0.9 % IV SOLN
500.0000 mL | Freq: Once | INTRAVENOUS | Status: DC
Start: 2023-04-27 — End: 2023-04-27

## 2023-04-27 NOTE — Patient Instructions (Addendum)
 -Handout on polyps, hemorrhoids provided -await pathology results -repeat colonoscopy for surveillance recommended. Date to be determined when pathology result become available   -Continue present medications   YOU HAD AN ENDOSCOPIC PROCEDURE TODAY AT THE [facility]:   Refer to the procedure report that was given to you for any specific questions about what was found during the examination.  If the procedure report does not answer your questions, please call your gastroenterologist to clarify.  If you requested that your care partner not be given the details of your procedure findings, then the procedure report has been included in a sealed envelope for you to review at your convenience later.  YOU SHOULD EXPECT: Some feelings of bloating in the abdomen. Passage of more gas than usual.  Walking can help get rid of the air that was put into your GI tract during the procedure and reduce the bloating. If you had a lower endoscopy (such as a colonoscopy or flexible sigmoidoscopy) you may notice spotting of blood in your stool or on the toilet paper. If you underwent a bowel prep for your procedure, you may not have a normal bowel movement for a few days.  Please Note:  You might notice some irritation and congestion in your nose or some drainage.  This is from the oxygen used during your procedure.  There is no need for concern and it should clear up in a day or so.  SYMPTOMS TO REPORT IMMEDIATELY:  Following lower endoscopy (colonoscopy or flexible sigmoidoscopy):  Excessive amounts of blood in the stool  Significant tenderness or worsening of abdominal pains  Swelling of the abdomen that is new, acute  Fever of 100F or higher   For urgent or emergent issues, a gastroenterologist can be reached at any hour by calling [phone]. Do not use MyChart messaging for urgent concerns.    DIET:  We do recommend a small meal at first, but then you may proceed to your regular diet.   Drink plenty of fluids but you should avoid alcoholic beverages for 24 hours.  ACTIVITY:  You should plan to take it easy for the rest of today and you should NOT DRIVE or use heavy machinery until tomorrow (because of the sedation medicines used during the test).    FOLLOW UP: Our staff will call the number listed on your records the next business day following your procedure.  We will call around 7:15- 8:00 am to check on you and address any questions or concerns that you may have regarding the information given to you following your procedure. If we do not reach you, we will leave a message.     If any biopsies were taken you will be contacted by phone or by letter within the next 1-3 weeks.  Please call us at [phone] if you have not heard about the biopsies in 3 weeks.    SIGNATURES/CONFIDENTIALITY: You and/or your care partner have signed paperwork which will be entered into your electronic medical record.  These signatures attest to the fact that that the information above on your After Visit Summary has been reviewed and is understood.  Full responsibility of the confidentiality of this discharge information lies with you and/or your care-partner.  Colon Polyps  Colon polyps are tissue growths inside the colon, which is part of the large intestine. They are one of the types of polyps that can grow in the body. A polyp may be a round bump or a mushroom-shaped growth. You  could have one polyp or more than one. Most colon polyps are noncancerous (benign). However, some colon polyps can become cancerous over time. Finding and removing the polyps early can help prevent this. What are the causes? The exact cause of colon polyps is not known. What increases the risk? The following factors may make you more likely to develop this condition: Having a family history of colorectal cancer or colon polyps. Being older than 53 years of age. Being younger than 53 years of age and having a  significant family history of colorectal cancer or colon polyps or a genetic condition that puts you at higher risk of getting colon polyps. Having inflammatory bowel disease, such as ulcerative colitis or Crohn's disease. Having certain conditions passed from parent to child (hereditary conditions), such as: Familial adenomatous polyposis (FAP). Lynch syndrome. Turcot syndrome. Peutz-Jeghers syndrome. MUTYH-associated polyposis (MAP). Being overweight. Certain lifestyle factors. These include smoking cigarettes, drinking too much alcohol, not getting enough exercise, and eating a diet that is high in fat and red meat and low in fiber. Having had childhood cancer that was treated with radiation of the abdomen. What are the signs or symptoms? Many times, there are no symptoms. If you have symptoms, they may include: Blood coming from the rectum during a bowel movement. Blood in the stool (feces). The blood may be bright red or very dark in color. Pain in the abdomen. A change in bowel habits, such as constipation or diarrhea. How is this diagnosed? This condition is diagnosed with a colonoscopy. This is a procedure in which a lighted, flexible scope is inserted into the opening between the buttocks (anus) and then passed into the colon to examine the area. Polyps are sometimes found when a colonoscopy is done as part of routine cancer screening tests. How is this treated? This condition is treated by removing any polyps that are found. Most polyps can be removed during a colonoscopy. Those polyps will then be tested for cancer. Additional treatment may be needed depending on the results of testing. Follow these instructions at home: Eating and drinking  Eat foods that are high in fiber, such as fruits, vegetables, and whole grains. Eat foods that are high in calcium and vitamin D, such as milk, cheese, yogurt, eggs, liver, fish, and broccoli. Limit foods that are high in fat, such as fried  foods and desserts. Limit the amount of red meat, precooked or cured meat, or other processed meat that you eat, such as hot dogs, sausages, bacon, or meat loaves. Limit sugary drinks. Lifestyle Maintain a healthy weight, or lose weight if recommended by your health care provider. Exercise every day or as told by your health care provider. Do not use any products that contain nicotine or tobacco, such as cigarettes, e-cigarettes, and chewing tobacco. If you need help quitting, ask your health care provider. Do not drink alcohol if: Your health care provider tells you not to drink. You are pregnant, may be pregnant, or are planning to become pregnant. If you drink alcohol: Limit how much you use to: 0-1 drink a day for women. 0-2 drinks a day for men. Know how much alcohol is in your drink. In the [place] one drink equals one 12 oz bottle of beer (355 mL), one 5 oz glass of wine (148 mL), or one 1 oz glass of hard liquor (44 mL). General instructions Take over-the-counter and prescription medicines only as told by your health care provider. Keep all follow-up visits. This is important. This  includes having regularly scheduled colonoscopies. Talk to your health care provider about when you need a colonoscopy. Contact a health care provider if: You have new or worsening bleeding during a bowel movement. You have new or increased blood in your stool. You have a change in bowel habits. You lose weight for no known reason. Summary Colon polyps are tissue growths inside the colon, which is part of the large intestine. They are one type of polyp that can grow in the body. Most colon polyps are noncancerous (benign), but some can become cancerous over time. This condition is diagnosed with a colonoscopy. This condition is treated by removing any polyps that are found. Most polyps can be removed during a colonoscopy. This information is not intended to replace advice given to you by your health care  provider. Make sure you discuss any questions you have with your health care provider. Document Revised: 05/03/2019 Document Reviewed: 05/03/2019 Elsevier Patient Education  2024 Elsevier Inc.  Hemorrhoids Hemorrhoids are swollen veins that may form: In the butt (rectum). These are called internal hemorrhoids. Around the opening of the butt (anus). These are called external hemorrhoids. Most hemorrhoids do not cause very bad problems. They often get better with changes to your lifestyle and what you eat. What are the causes? Having trouble pooping (constipation) or watery poop (diarrhea). Pushing too hard when you poop. Pregnancy. Being very overweight (obese). Sitting for too long. Riding a bike for a long time. Heavy lifting or other things that take a lot of effort. Anal sex. What are the signs or symptoms? Pain. Itching or soreness in the butt. Bleeding from the butt. Leaking poop. Swelling. One or more lumps around the opening of your butt. How is this treated? In most cases, hemorrhoids can be treated at home. You may be told to: Change what you eat. Make changes to your lifestyle. If these treatments do not help, you may need to have a procedure done. Your doctor may need to: Place rubber bands at the bottom of the hemorrhoids to make them fall off. Put medicine into the hemorrhoids to shrink them. Shine a type of light on the hemorrhoids to cause them to fall off. Do surgery to get rid of the hemorrhoids. Follow these instructions at home: Medicines Take over-the-counter and prescription medicines only as told by your doctor. Use creams with medicine in them or medicines that you put in your butt as told by your doctor. Eating and drinking  Eat foods that have a lot of fiber in them. These include whole grains, beans, nuts, fruits, and vegetables. Ask your doctor about taking products that have fiber added to them (fibersupplements). Take in less fat. You can do  this by: Eating low-fat dairy products. Eating less red meat. Staying away from processed foods. Drink enough fluid to keep your pee (urine) pale yellow. Managing pain and swelling  Take a warm-water bath (sitz bath) for 20 minutes to ease pain. Do this 3-4 times a day. You may do this in a bathtub. You may also use a portable sitz bath that fits over the toilet. If told, put ice on the painful area. It may help to use ice between your warm baths. Put ice in a plastic bag. Place a towel between your skin and the bag. Leave the ice on for 20 minutes, 2-3 times a day. If your skin turns bright red, take off the ice right away to prevent skin damage. The risk of damage is higher if you  cannot feel pain, heat, or cold. General instructions Exercise. Ask your doctor how much and what kind of exercise is best for you. Go to the bathroom when you need to poop. Do not wait. Try not to push too hard when you poop. Keep your butt dry and clean. Use wet toilet paper or moist towelettes after you poop. Do not sit on the toilet for a long time. Contact a doctor if: You have pain and swelling that do not get better with treatment. You have trouble pooping. You cannot poop. You have pain or swelling outside the area of the hemorrhoids. Get help right away if: You have bleeding from the butt that will not stop. This information is not intended to replace advice given to you by your health care provider. Make sure you discuss any questions you have with your health care provider. Document Revised: 09/24/2021 Document Reviewed: 09/24/2021 Elsevier Patient Education  2024 [employer].

## 2023-04-27 NOTE — Progress Notes (Signed)
 Sedate, gd SR, tolerated procedure well, VSS, report to RN

## 2023-04-27 NOTE — Op Note (Signed)
 [facility] Patient Name: Nicholas Hughes Procedure Date: 04/27/2023 8:09 AM MRN: [id number] Endoscopist: Madelyn Brunner [place] , , [id number] Age: 53 Referring MD:  Date of Birth: 1970-03-25 Gender: Male Account #: [id number] Procedure:                Colonoscopy Indications:              Screening for colorectal malignant neoplasm, This                            is the patient's first colonoscopy Medicines:                Monitored Anesthesia Care Procedure:                Pre-Anesthesia Assessment:                           - Prior to the procedure, a History and Physical                            was performed, and patient medications and                            allergies were reviewed. The patient's tolerance of                            previous anesthesia was also reviewed. The risks                            and benefits of the procedure and the sedation                            options and risks were discussed with the patient.                            All questions were answered, and informed consent                            was obtained. Prior Anticoagulants: The patient has                            taken no anticoagulant or antiplatelet agents. ASA                            Grade Assessment: II - A patient with mild systemic                            disease. After reviewing the risks and benefits,                            the patient was deemed in satisfactory condition to                            undergo the procedure.  After obtaining informed consent, the colonoscope                            was passed under direct vision. Throughout the                            procedure, the patient's blood pressure, pulse, and                            oxygen saturations were monitored continuously. The                            Olympus Scope [id number] was introduced through the                            anus and advanced  to the the terminal ileum. The                            colonoscopy was performed without difficulty. The                            patient tolerated the procedure well. The quality                            of the bowel preparation was good. The terminal                            ileum, ileocecal valve, appendiceal orifice, and                            rectum were photographed. Scope In: 8:12:54 AM Scope Out: 8:28:14 AM Scope Withdrawal Time: 0 hours 13 minutes 15 seconds  Total Procedure Duration: 0 hours 15 minutes 20 seconds  Findings:                 The terminal ileum appeared normal.                           A 3 mm polyp was found in the cecum. The polyp was                            sessile. The polyp was removed with a cold snare.                            Resection and retrieval were complete.                           Non-bleeding internal hemorrhoids were found during                            retroflexion. Complications:            No immediate complications. Estimated Blood Loss:     Estimated blood loss was minimal. Impression:               -  The examined portion of the ileum was normal.                           - One 3 mm polyp in the cecum, removed with a cold                            snare. Resected and retrieved.                           - Non-bleeding internal hemorrhoids. Recommendation:           - Discharge patient to home (with escort).                           - Await pathology results.                           - The findings and recommendations were discussed                            with the patient. Dr Particia Lather "[name]" [name],  04/27/2023 8:35:56 AM

## 2023-04-27 NOTE — Progress Notes (Signed)
 GASTROENTEROLOGY PROCEDURE H&P NOTE   Primary Care Physician: Mliss Sax, MD    Reason for Procedure:   Colon cancer screening  Plan:    Colonoscopy  Patient is appropriate for endoscopic procedure(s) in the ambulatory ([facility]) setting.  The nature of the procedure, as well as the risks, benefits, and alternatives were carefully and thoroughly reviewed with the patient. Ample time for discussion and questions allowed. The patient understood, was satisfied, and agreed to proceed.     HPI: Nicholas Hughes is a 53 y.o. male who presents for colonoscopy for colon cancer screening. Denies blood in stools, changes in bowel habits, or unintentional weight loss. Denies family history of colon cancer. This is his first colonoscopy.  Past Medical History:  Diagnosis Date   Arthritis    Borderline diabetes    CKD (chronic kidney disease) stage 2, GFR 60-89 ml/min    Diabetes mellitus without complication (HCC)    ED (erectile dysfunction)    GERD (gastroesophageal reflux disease)    Hypertension    LVH (left ventricular hypertrophy)    OSA (obstructive sleep apnea) 07/14/2014   Moderate OSA with AHI 20/hr   Renal insufficiency    Sleep apnea    Transient atrial fibrillation or flutter     Past Surgical History:  Procedure Laterality Date   Arm surgery  Right    TRANSTHORACIC ECHOCARDIOGRAM  05/07/2009   EF 60-65%    Prior to Admission medications   Medication Sig Start Date End Date Taking? Authorizing Provider  carvedilol (COREG) 12.5 MG tablet Take 1 tablet (12.5 mg total) by mouth 2 (two) times daily with a meal. 07/10/22  Yes Mliss Sax, MD  diltiazem (CARDIZEM CD) 300 MG 24 hr capsule Take 1 capsule by mouth once daily 03/25/23  Yes Mliss Sax, MD  OVER THE COUNTER MEDICATION daily at 6 (six) AM. Ivan Anchors   Yes [provider]  OVER THE COUNTER MEDICATION [place] powder   Yes [provider]  potassium chloride SA  (KLOR-CON M) 20 MEQ tablet Take 1 tablet (20 mEq total) by mouth daily. 07/10/22  Yes Mliss Sax, MD  triamterene-hydrochlorothiazide [facility]) 75-50 MG tablet Take 1 tablet by mouth once daily 03/25/23  Yes Mliss Sax, MD  vitamin C (ASCORBIC ACID) 250 MG tablet Take 250 mg by mouth daily. Takes 2 for total of 500mg    Yes [provider]  empagliflozin (JARDIANCE) 25 MG TABS tablet Take 1 tablet (25 mg total) by mouth daily. 10/12/22   Mliss Sax, MD  hydrocortisone 2.5 % cream Apply topically. 04/08/23   [provider]  ketoconazole (NIZORAL) 2 % shampoo Apply topically. 03/05/23   [provider]  temazepam (RESTORIL) 15 MG capsule 1 or 2 caps for sleep as needed Patient not taking: Reported on 04/27/2023 10/15/22   Waymon Budge, MD    Current Outpatient Medications  Medication Sig Dispense Refill   carvedilol (COREG) 12.5 MG tablet Take 1 tablet (12.5 mg total) by mouth 2 (two) times daily with a meal. 180 tablet 1   diltiazem (CARDIZEM CD) 300 MG 24 hr capsule Take 1 capsule by mouth once daily 90 capsule 0   OVER THE COUNTER MEDICATION daily at 6 (six) AM. [place]     OVER THE COUNTER MEDICATION [place]a powder     potassium chloride SA (KLOR-CON M) 20 MEQ tablet Take 1 tablet (20 mEq total) by mouth daily. 90 tablet 3   triamterene-hydrochlorothiazide (MAXZIDE) 75-50  MG tablet Take 1 tablet by mouth once daily 90 tablet 0   vitamin C (ASCORBIC ACID) 250 MG tablet Take 250 mg by mouth daily. Takes 2 for total of 500mg      empagliflozin (JARDIANCE) 25 MG TABS tablet Take 1 tablet (25 mg total) by mouth daily. 90 tablet 1   hydrocortisone 2.5 % cream Apply topically.     ketoconazole (NIZORAL) 2 % shampoo Apply topically.     temazepam (RESTORIL) 15 MG capsule 1 or 2 caps for sleep as needed (Patient not taking: Reported on 04/27/2023) 45 capsule 2   Current Facility-Administered Medications  Medication Dose Route Frequency  Provider Last Rate Last Admin   0.9 %  sodium chloride infusion  500 mL Intravenous Once Imogene Burn, MD        Allergies as of 04/27/2023   (No Known Allergies)    Family History  Problem Relation Age of Onset   Hypertension Father    Coronary artery disease Father    Colon polyps Neg Hx    Colon cancer Neg Hx    Esophageal cancer Neg Hx    Rectal cancer Neg Hx    Stomach cancer Neg Hx     Social History   Socioeconomic History   Marital status: Single    Spouse name: Not on file   Number of children: Not on file   Years of education: Not on file   Highest education level: Not on file  Occupational History   Not on file  Tobacco Use   Smoking status: Never   Smokeless tobacco: Never  Vaping Use   Vaping status: Never Used  Substance and Sexual Activity   Alcohol use: Yes    Comment: occasionally   Drug use: No   Sexual activity: Yes  Other Topics Concern   Not on file  Social History Narrative   Right handed   One story home   Drinks no caffeine   Social Drivers of Corporate investment banker Strain: Not on file  Food Insecurity: Not on file  Transportation Needs: Not on file  Physical Activity: Not on file  Stress: Not on file  Social Connections: Unknown (06/10/2021)   Received from [facility], [facility]   Social Network    Social Network: Not on file  Intimate Partner Violence: Unknown (05/02/2021)   [facility], [facility]   HITS    Physically Hurt: Not on file    Insult or Talk Down To: Not on file    Threaten Physical Harm: Not on file    Scream or Curse: Not on file    Physical Exam: Vital signs in last 24 hours: BP 139/86   Pulse 68   Temp 98.1 F (36.7 C) (Temporal)   Ht 6' (1.829 m)   Wt 254 lb (115.2 kg)   SpO2 96%   BMI 34.45 kg/m  GEN: NAD EYE: Sclerae anicteric ENT: MMM CV: Non-tachycardic Pulm: No increased work of breathing GI: Soft, NT/ND NEURO:  Alert & Oriented   Eulah Pont,  MD [facility]  04/27/2023 8:07 AM

## 2023-04-27 NOTE — Progress Notes (Signed)
 Called to room to assist during endoscopic procedure.  Patient ID and intended procedure confirmed with present staff. Received instructions for my participation in the procedure from the performing physician.

## 2023-04-27 NOTE — Progress Notes (Signed)
 Pt's states no medical or surgical changes since previsit or office visit.

## 2023-04-28 ENCOUNTER — Telehealth: Payer: Self-pay | Admitting: *Deleted

## 2023-04-28 NOTE — Telephone Encounter (Signed)
  Follow up Call-     04/27/2023    7:09 AM  Call back number  Post procedure Call Back phone  # [phone]  Permission to leave phone message Yes     Patient questions:  Do you have a fever, pain , or abdominal swelling? No. Pain Score  0 *  Have you tolerated food without any problems? Yes.    Have you been able to return to your normal activities? Yes.    Do you have any questions about your discharge instructions: Diet   No. Medications  No. Follow up visit  No.  Do you have questions or concerns about your Care? No.  Actions: * If pain score is 4 or above: No action needed, pain <4.

## 2023-05-03 ENCOUNTER — Encounter: Payer: Self-pay | Admitting: Internal Medicine

## 2023-05-03 LAB — SURGICAL PATHOLOGY

## 2023-05-13 ENCOUNTER — Ambulatory Visit: Admitting: Family Medicine

## 2023-05-27 ENCOUNTER — Encounter: Payer: Self-pay | Admitting: Family Medicine

## 2023-05-27 ENCOUNTER — Ambulatory Visit: Admitting: Family Medicine

## 2023-05-27 VITALS — BP 142/90 | HR 92 | Temp 98.2°F | Ht 72.0 in | Wt 250.2 lb

## 2023-05-27 DIAGNOSIS — M25561 Pain in right knee: Secondary | ICD-10-CM | POA: Diagnosis not present

## 2023-05-27 DIAGNOSIS — I10 Essential (primary) hypertension: Secondary | ICD-10-CM | POA: Diagnosis not present

## 2023-05-27 DIAGNOSIS — R7303 Prediabetes: Secondary | ICD-10-CM

## 2023-05-27 LAB — COMPREHENSIVE METABOLIC PANEL WITH GFR
ALT: 22 U/L (ref 0–53)
AST: 21 U/L (ref 0–37)
Albumin: 4.3 g/dL (ref 3.5–5.2)
Alkaline Phosphatase: 63 U/L (ref 39–117)
BUN: 21 mg/dL (ref 6–23)
CO2: 28 meq/L (ref 19–32)
Calcium: 9.4 mg/dL (ref 8.4–10.5)
Chloride: 100 meq/L (ref 96–112)
Creatinine, Ser: 1.37 mg/dL (ref 0.40–1.50)
GFR: 59.23 mL/min — ABNORMAL LOW (ref >60.00)
Glucose, Bld: 88 mg/dL (ref 70–99)
Potassium: 3.1 meq/L — ABNORMAL LOW (ref 3.5–5.1)
Sodium: 138 meq/L (ref 135–145)
Total Bilirubin: 0.5 mg/dL (ref 0.2–1.2)
Total Protein: 7.5 g/dL (ref 6.0–8.3)

## 2023-05-27 LAB — HEMOGLOBIN A1C: Hgb A1c MFr Bld: 6.4 % (ref 4.6–6.5)

## 2023-05-27 NOTE — Progress Notes (Signed)
 Established Patient Office Visit   Subjective:  Patient ID: Nicholas Hughes, male    DOB: 1970-08-20  Age: 52 y.o. MRN: [id number]  Chief Complaint  Patient presents with   Medical Management of Chronic Issues    3 month follow up. Pt states he has had elevated blood pressure readings.    Knee Pain    Right knee pain. Pt states joint injection he got a few months ago didi not help. Wants to know if Dr. Tilmon Font can do an injection.     Knee Pain  Pertinent negatives include no tingling.   Encounter Diagnoses  Name Primary?   Essential hypertension Yes   Pre-diabetes    Right knee pain, unspecified chronicity    For follow-up of prediabetes.  Continues empagliflozin .  Ongoing issues with right knee pain.  Status post steroid injection at the end of March.  It seemed to help for a little while but discomfort has returned.  Knee remains swollen.  Plain films had shown arthritis.  History of hypertension accompanied by a history of LVH 18.  Continues diltiazem  300 Dyazide and carvedilol  12.5 twice daily cardiology.  Blood pressure remains in the 140s over 80s to 90s.   Review of Systems  Constitutional: Negative.   HENT: Negative.    Eyes:  Negative for blurred vision, discharge and redness.  Respiratory: Negative.    Cardiovascular: Negative.   Gastrointestinal:  Negative for abdominal pain.  Genitourinary: Negative.   Musculoskeletal:  Positive for joint pain. Negative for myalgias.  Skin:  Negative for rash.  Neurological:  Negative for tingling, loss of consciousness and weakness.  Endo/Heme/Allergies:  Negative for polydipsia.     Current Outpatient Medications:    carvedilol  (COREG ) 12.5 MG tablet, Take 1 tablet (12.5 mg total) by mouth 2 (two) times daily with a meal., Disp: 180 tablet, Rfl: 1   diltiazem  (CARDIZEM  CD) 300 MG 24 hr capsule, Take 1 capsule by mouth once daily, Disp: 90 capsule, Rfl: 0   empagliflozin  (JARDIANCE ) 25 MG TABS tablet, Take 1 tablet (25 mg  total) by mouth daily., Disp: 90 tablet, Rfl: 1   hydrocortisone 2.5 % cream, Apply topically., Disp: , Rfl:    ketoconazole (NIZORAL) 2 % shampoo, Apply topically., Disp: , Rfl:    OVER THE COUNTER MEDICATION, daily at 6 (six) AM. [place], Disp: , Rfl:    OVER THE COUNTER MEDICATION, Moringa powder, Disp: , Rfl:    potassium chloride  SA (KLOR-CON  M) 20 MEQ tablet, Take 1 tablet (20 mEq total) by mouth daily., Disp: 90 tablet, Rfl: 3   triamterene -hydrochlorothiazide (MAXZIDE) 75-50 MG tablet, Take 1 tablet by mouth once daily, Disp: 90 tablet, Rfl: 0   vitamin C (ASCORBIC ACID) 250 MG tablet, Take 250 mg by mouth daily. Takes 2 for total of 500mg , Disp: , Rfl:    temazepam  (RESTORIL ) 15 MG capsule, 1 or 2 caps for sleep as needed (Patient not taking: Reported on 04/09/2023), Disp: 45 capsule, Rfl: 2   Objective:     BP (!) 142/90 (Cuff Size: Large)   Pulse 92   Temp 98.2 F (36.8 C) (Temporal)   Ht 6' (1.829 m)   Wt 250 lb 3.2 oz (113.5 kg)   SpO2 99%   BMI 33.93 kg/m  BP Readings from Last 3 Encounters:  05/27/23 (!) 142/90  04/27/23 (!) 135/94  04/08/23 136/84   Wt Readings from Last 3 Encounters:  05/27/23 250 lb 3.2 oz (113.5 kg)  04/27/23 254 lb (115.2  kg)  04/09/23 254 lb (115.2 kg)      Physical Exam Constitutional:      General: He is not in acute distress.    Appearance: Normal appearance. He is not ill-appearing, toxic-appearing or diaphoretic.  HENT:     Head: Normocephalic and atraumatic.     Right Ear: External ear normal.     Left Ear: External ear normal.  Eyes:     General: No scleral icterus.       Right eye: No discharge.        Left eye: No discharge.     Extraocular Movements: Extraocular movements intact.     Conjunctiva/sclera: Conjunctivae normal.  Pulmonary:     Effort: Pulmonary effort is normal. No respiratory distress.  Skin:    General: Skin is warm and dry.  Neurological:     Mental Status: He is alert and oriented to person, place,  and time.  Psychiatric:        Mood and Affect: Mood normal.        Behavior: Behavior normal.      No results found for any visits on 05/27/23.    The 10-year ASCVD risk score (Arnett DK, et al., 2019) is: 11.1%    Assessment & Plan:   Essential hypertension -     Ambulatory referral to Cardiology -     Comprehensive metabolic panel with GFR  Pre-diabetes -     Comprehensive metabolic panel with GFR -     Hemoglobin A1c  Right knee pain, unspecified chronicity -     Ambulatory referral to Sports Medicine    Return in about 3 months (around 08/27/2023), or Please try to lose some weight., for annual physical.  Information given on managing hypertension.  Referral to cardiology for management of hypertension.   Tonna Frederic, MD

## 2023-06-04 ENCOUNTER — Encounter: Payer: Self-pay | Admitting: Family Medicine

## 2023-06-04 ENCOUNTER — Ambulatory Visit (INDEPENDENT_AMBULATORY_CARE_PROVIDER_SITE_OTHER): Admitting: Family Medicine

## 2023-06-04 VITALS — BP 130/88 | HR 63 | Ht 72.0 in | Wt 252.0 lb

## 2023-06-04 DIAGNOSIS — M1711 Unilateral primary osteoarthritis, right knee: Secondary | ICD-10-CM

## 2023-06-04 DIAGNOSIS — G8929 Other chronic pain: Secondary | ICD-10-CM | POA: Diagnosis not present

## 2023-06-04 DIAGNOSIS — M25561 Pain in right knee: Secondary | ICD-10-CM | POA: Diagnosis not present

## 2023-06-04 MED ORDER — MELOXICAM 15 MG PO TABS: 15.0000 mg | ORAL_TABLET | Freq: Every day | ORAL | 1 refills | Status: DC | PRN

## 2023-06-04 NOTE — Progress Notes (Signed)
   I, Miquel Amen, CMA acting as a scribe for Garlan Juniper, MD.  Elene Griffes Sabins is a 53 y.o. male who presents to [facility] at [facility] today for cont'd R knee pain. Pt was last seen by Dr. Alease Hunter on 04/08/23 and was given a R knee steroid injection and advised to cont Voltaren gel.  Today, pt reports exacerbation of right knee sx which started about 1-2 weeks after his last visit. Swelling present. Denies mechanical sx or new injury. Notes pain radiating into the lower leg. Sx worse at night and when stretching. Notes limited ROM with knee flexion.   Dx imaging: 04/08/23 R knee XR  Pertinent review of systems: No fevers or chills  Relevant historical information: Hypertension.  .   Exam:  BP 130/88   Pulse 63   Ht 6' (1.829 m)   Wt 252 lb (114.3 kg)   SpO2 98%   BMI 34.18 kg/m  General: Well Developed, well nourished, and in no acute distress.   MSK: Right knee moderate effusion normal-appearing otherwise normal motion.  Tender palpation medial joint line.    Lab and Radiology Results  EXAM: RIGHT KNEE 3 VIEWS   COMPARISON:  None Available.   FINDINGS: Borderline patellar Alta. Mild tibiofemoral joint space narrowing. Mild tricompartmental peripheral spurring. Small quadriceps tendon enthesophyte. Trace knee joint effusion. No fracture, erosion, or focal bone abnormality. Mild prepatellar soft tissue thickening.   IMPRESSION: 1. Mild tricompartmental osteoarthritis. 2. Trace knee joint effusion. 3. Mild prepatellar soft tissue thickening.     Electronically Signed   By: Chadwick Colonel M.D.   On: 04/23/2023 22:31 I, Garlan Juniper, personally (independently) visualized and performed the interpretation of the images attached in this note.      Assessment and Plan: 53 y.o. male with chronic right knee pain.  Unfortunately conventional steroid injection on March 13 did not provide lasting benefit.  He has only mild appearing arthritis on  x-ray but is having much more significant discomfort and pain and swelling than I would expect for mild arthritis.  We are going to try to get Zilretta  authorized.  However if that does not work or if we are not able to get authorized next step would be an MRI to further characterize source of pain and for treatment plan and options.  For now we will prescribe meloxicam.   PDMP not reviewed this encounter. No orders of the defined types were placed in this encounter.  Meds ordered this encounter  Medications   meloxicam (MOBIC) 15 MG tablet    Sig: Take 1 tablet (15 mg total) by mouth daily as needed for pain.    Dispense:  30 tablet    Refill:  1     Discussed warning signs or symptoms. Please see discharge instructions. Patient expresses understanding.  The above documentation has been reviewed and is accurate and complete Garlan Juniper, M.D.

## 2023-06-04 NOTE — Patient Instructions (Addendum)
 Thank you for coming in today.   We will check your insurance coverage of Zilretta  and touch base next week.   Prescription sent to your pharmacy for Meloxicam 15 mg to take daily as needed.

## 2023-06-10 ENCOUNTER — Telehealth: Payer: Self-pay

## 2023-06-10 NOTE — Telephone Encounter (Signed)
 Zilretta  has been denied because patient has to have tried and failed both triamcinolone  acetonide and methylprednisolone acetate injections.  Looks like we did the triamcinolone  so he would need to try the methylprednisolone.

## 2023-06-11 DIAGNOSIS — G4733 Obstructive sleep apnea (adult) (pediatric): Secondary | ICD-10-CM | POA: Diagnosis not present

## 2023-06-18 ENCOUNTER — Other Ambulatory Visit: Payer: Self-pay | Admitting: Family Medicine

## 2023-06-18 DIAGNOSIS — I4891 Unspecified atrial fibrillation: Secondary | ICD-10-CM

## 2023-06-18 DIAGNOSIS — I10 Essential (primary) hypertension: Secondary | ICD-10-CM

## 2023-06-29 ENCOUNTER — Other Ambulatory Visit: Payer: Self-pay | Admitting: Family Medicine

## 2023-06-29 DIAGNOSIS — I10 Essential (primary) hypertension: Secondary | ICD-10-CM

## 2023-07-18 ENCOUNTER — Other Ambulatory Visit: Payer: Self-pay | Admitting: Family Medicine

## 2023-07-18 DIAGNOSIS — I10 Essential (primary) hypertension: Secondary | ICD-10-CM

## 2023-07-28 ENCOUNTER — Other Ambulatory Visit: Payer: Self-pay | Admitting: Family Medicine

## 2023-07-28 DIAGNOSIS — M1711 Unilateral primary osteoarthritis, right knee: Secondary | ICD-10-CM

## 2023-07-28 DIAGNOSIS — G8929 Other chronic pain: Secondary | ICD-10-CM

## 2023-07-28 NOTE — Telephone Encounter (Signed)
 Last OV 06/04/23 Next OV not scheduled  Last refill 06/04/23 Qty #30/1    Renal labs 05/27/23           Component Ref Range & Units (hover) 2 mo ago (05/27/23) 9 mo ago (10/12/22) 1 yr ago (07/10/22) 1 yr ago (03/30/22) 1 yr ago (03/07/22) 1 yr ago (09/14/21) 2 yr ago (07/07/21)  Sodium 138 140 139 140 136 R 139 R 137  Potassium 3.1 Low  3.8 3.6 3.9 3.7 R 3.6 R 3.9  Chloride 100 101 99 101 100 R 102 R 101  CO2 28 32 30 30 27  R 29 R 26  Glucose, Bld 88 64 Low  79 81 92 CM 106 High  CM 68 Low   BUN 21 20 15 14 17  R 23 High  R 19  Creatinine, Ser 1.37 1.32 1.29 1.34 1.33 High  R 1.63 High  R 1.35  Total Bilirubin 0.5  0.7      Alkaline Phosphatase 63  62      AST 21  22      ALT 22  18      Total Protein 7.5  7.6      Albumin 4.3  4.2      GFR 59.23 Low  62.21 CM 64.06 CM 61.33 CM   61.09 CM  Comment: Calculated using the CKD-EPI Creatinine Equation (2021)  Calcium 9.4 9.5 9.4 10.0 9.6 R 9.5 R 9.6

## 2023-07-28 NOTE — Telephone Encounter (Signed)
 I did reauthorize the meloxicam  refill.  However based on my most recent notes the plan was to try to do a Zilretta  shot which unfortunately was denied.  Since you are not feeling much better I will proceed to the MRI that we talked about during the last visit.  MRI is ordered.

## 2023-07-28 NOTE — Telephone Encounter (Signed)
 Message sent to pt via My Chart

## 2023-08-05 ENCOUNTER — Ambulatory Visit
Admission: RE | Admit: 2023-08-05 | Discharge: 2023-08-05 | Disposition: A | Discharge status: Home / Self Care | Source: Ambulatory Visit | Attending: Family Medicine | Admitting: Family Medicine

## 2023-08-05 DIAGNOSIS — M1711 Unilateral primary osteoarthritis, right knee: Secondary | ICD-10-CM

## 2023-08-05 DIAGNOSIS — G8929 Other chronic pain: Secondary | ICD-10-CM

## 2023-08-06 ENCOUNTER — Ambulatory Visit: Payer: Self-pay | Admitting: Family Medicine

## 2023-08-06 NOTE — Progress Notes (Signed)
 Right knee MRI shows more severe arthritis than is visible on the x-ray.  Additionally there are some meniscus tears which I think are less of a factor.  Lets get you scheduled for an office visit with me in the near future to try a second type of cortisone shot.  If that fails we should be able to get the Zilretta  authorized.  If we trial these shots and nothing works then a knee replacement may be necessary.

## 2023-08-17 ENCOUNTER — Other Ambulatory Visit: Payer: Self-pay

## 2023-08-17 ENCOUNTER — Ambulatory Visit (INDEPENDENT_AMBULATORY_CARE_PROVIDER_SITE_OTHER): Admitting: Family Medicine

## 2023-08-17 ENCOUNTER — Encounter: Payer: Self-pay | Admitting: Family Medicine

## 2023-08-17 VITALS — BP 146/92 | HR 73 | Ht 72.0 in | Wt 259.0 lb

## 2023-08-17 DIAGNOSIS — M25561 Pain in right knee: Secondary | ICD-10-CM

## 2023-08-17 DIAGNOSIS — M1711 Unilateral primary osteoarthritis, right knee: Secondary | ICD-10-CM

## 2023-08-17 DIAGNOSIS — G8929 Other chronic pain: Secondary | ICD-10-CM

## 2023-08-17 MED ORDER — METHYLPREDNISOLONE ACETATE 40 MG/ML IJ SUSP
40.0000 mg | Freq: Once | INTRAMUSCULAR | Status: AC
  Administered 2023-08-17: 40 mg via INTRA_ARTICULAR

## 2023-08-17 NOTE — Patient Instructions (Addendum)
 Thank you for coming in today.   You received an injection today. Seek immediate medical attention if the joint becomes red, extremely painful, or is oozing fluid.   See you back as needed. Let us  know if the injection was not helpful and we will work on getting Zilretta  authorized, OK to schedule with Dr. Leonce.

## 2023-08-17 NOTE — Progress Notes (Unsigned)
 I, Leotis Batter, CMA acting as a scribe for Artist Lloyd, MD.  Nicholas Hughes is a 53 y.o. male who presents to [facility] at [facility] today for f/u R knee pain w/ MRI review. Pt was last seen by Dr. Lloyd on 06/04/23 and was prescribed meloxicam  and MRI was ordered.   Last R knee steroid injection, 04/08/23  Today, pt reports waxing and waning of knee sx. Felt pretty good for a few days, has flared back up again more recent and become very stiff. MRI review today.   Dx imaging: 08/05/23 R knee MRI 04/08/23 R knee XR  Pertinent review of systems: No fevers or chills  Relevant historical information: Hypertension and transient atrial fibrillation.   Exam:  BP (!) 146/92   Pulse 73   Ht 6' (1.829 m)   Wt 259 lb (117.5 kg)   SpO2 97%   BMI 35.13 kg/m  General: Well Developed, well nourished, and in no acute distress.   MSK: Right knee large effusion.  Decreased range of motion.    Lab and Radiology Results  Procedure: Real-time Ultrasound Guided Injection of right knee joint superior lateral patella space Device: Philips Affiniti 50G/GE Logiq Images permanently stored and available for review in PACS Verbal informed consent obtained.  Discussed risks and benefits of procedure. Warned about infection, bleeding, hyperglycemia damage to structures among others. Patient expresses understanding and agreement Time-out conducted.   Noted no overlying erythema, induration, or other signs of local infection.   Skin prepped in a sterile fashion.   Local anesthesia: Topical Ethyl chloride.   With sterile technique and under real time ultrasound guidance: 40 mg of Depo-Medrol  and 2 mL of Marcaine injected into knee joint. Fluid seen entering the joint capsule.   Completed without difficulty   Pain immediately resolved suggesting accurate placement of the medication.   Advised to call if fevers/chills, erythema, induration, drainage, or persistent bleeding.    Images permanently stored and available for review in the ultrasound unit.  Impression: Technically successful ultrasound guided injection.    MR KNEE WITHOUT IV CONTRAST RIGHT   COMPARISON: None.   CLINICAL HISTORY: Chronic knee pain   PULSE SEQUENCES: Ax PD FS, Sag T2 ACL, Sag PD FS, Cor PD FS & COR T1   FINDINGS: Bones: There is moderate tricompartmental osteoarthrosis most notably the medial compartment and inferior patellofemoral compartment. There is full-thickness cartilage loss. Mild reactive edema seen in the medial compartment with osteophyte formation. There is edema and subchondral cystic change in the posterior nonweightbearing medial femoral condyle best seen on sagittal image 21. There is a moderate size reactive joint effusion. There is mild lateral translation the patella. The extensor mechanism is intact.   Ligaments: The ACL, PCL, MCL and fibular collateral ligament are intact.   Menisci: Lateral meniscus is unremarkable. There is a tear of the posterior horn and body of the medial meniscus best seen on coronal image 23 and 24 and sagittal image 20 through 25. There is no displaced meniscal flap.   IMPRESSION: Tricompartmental osteoarthrosis most notably the medial and patellofemoral compartment. There is full-thickness cartilage loss and mild reactive edema. Moderate reactive joint effusion. There is slight lateral translation the patella.   Complex degenerative tear of the posterior horn and body of the medial meniscus as above. No displaced meniscal flap.   No ligament injury.   Electronically signed by: Norleen Satchel MD 08/05/2023 09:21 AM EDT RP Workstation: [id number] LILLETTE Artist Lloyd, personally (independently) visualized and performed  the interpretation of the images attached in this note.    Assessment and Plan: 53 y.o. male with right knee pain due to significant DJD.  Patient has much more degenerative changes on MRI that are visible on  his x-ray.  He already has had a trial of a conventional triamcinolone  steroid injection.  Will proceed to a Depo-Medrol  (methylprednisolone ) injection today.  If this does not work we should be able to get Zilretta  authorized which could be helpful.  If all injections do not work he may need to have a knee replacement which we talked a bit about.  Let us  know how he feels after today's injection.   PDMP not reviewed this encounter. Orders Placed This Encounter  Procedures   US  LIMITED JOINT SPACE STRUCTURES LOW RIGHT(NO LINKED CHARGES)    Reason for Exam (SYMPTOM  OR DIAGNOSIS REQUIRED):   right knee pain    Preferred imaging location?:   [facility]   Meds ordered this encounter  Medications   methylPREDNISolone  acetate (DEPO-MEDROL ) injection 40 mg     Discussed warning signs or symptoms. Please see discharge instructions. Patient expresses understanding.   The above documentation has been reviewed and is accurate and complete Artist Lloyd, M.D.

## 2023-08-27 ENCOUNTER — Encounter: Payer: Self-pay | Admitting: Family Medicine

## 2023-08-27 ENCOUNTER — Ambulatory Visit: Admitting: Family Medicine

## 2023-08-27 VITALS — BP 122/72 | HR 66 | Temp 97.7°F | Ht 72.0 in | Wt 254.0 lb

## 2023-08-27 DIAGNOSIS — Z23 Encounter for immunization: Secondary | ICD-10-CM

## 2023-08-27 DIAGNOSIS — Z125 Encounter for screening for malignant neoplasm of prostate: Secondary | ICD-10-CM | POA: Diagnosis not present

## 2023-08-27 DIAGNOSIS — R7303 Prediabetes: Secondary | ICD-10-CM | POA: Diagnosis not present

## 2023-08-27 DIAGNOSIS — Z1159 Encounter for screening for other viral diseases: Secondary | ICD-10-CM

## 2023-08-27 DIAGNOSIS — I10 Essential (primary) hypertension: Secondary | ICD-10-CM

## 2023-08-27 DIAGNOSIS — Z0000 Encounter for general adult medical examination without abnormal findings: Secondary | ICD-10-CM

## 2023-08-27 DIAGNOSIS — G4733 Obstructive sleep apnea (adult) (pediatric): Secondary | ICD-10-CM

## 2023-08-27 DIAGNOSIS — E876 Hypokalemia: Secondary | ICD-10-CM | POA: Diagnosis not present

## 2023-08-27 DIAGNOSIS — Z13220 Encounter for screening for lipoid disorders: Secondary | ICD-10-CM | POA: Diagnosis not present

## 2023-08-27 LAB — URINALYSIS, ROUTINE W REFLEX MICROSCOPIC
Bilirubin Urine: NEGATIVE
Hgb urine dipstick: NEGATIVE
Ketones, ur: NEGATIVE
Leukocytes,Ua: NEGATIVE
Nitrite: NEGATIVE
RBC / HPF: NONE SEEN (ref 0–?)
Specific Gravity, Urine: 1.015 (ref 1.000–1.030)
Total Protein, Urine: NEGATIVE
Urine Glucose: >=1000 — AB
Urobilinogen, UA: 0.2 (ref 0.0–1.0)
WBC, UA: NONE SEEN (ref 0–?)
pH: 7 (ref 5.0–8.0)

## 2023-08-27 LAB — CBC WITH DIFFERENTIAL/PLATELET
Basophils Absolute: 0 K/uL (ref 0.0–0.1)
Basophils Relative: 0.6 % (ref 0.0–3.0)
Eosinophils Absolute: 0 K/uL (ref 0.0–0.7)
Eosinophils Relative: 0.6 % (ref 0.0–5.0)
HCT: 44.4 % (ref 39.0–52.0)
Hemoglobin: 14.9 g/dL (ref 13.0–17.0)
Lymphocytes Relative: 37.3 % (ref 12.0–46.0)
Lymphs Abs: 1.5 K/uL (ref 0.7–4.0)
MCHC: 33.7 g/dL (ref 30.0–36.0)
MCV: 85.7 fl (ref 78.0–100.0)
Monocytes Absolute: 0.5 K/uL (ref 0.1–1.0)
Monocytes Relative: 11.7 % (ref 3.0–12.0)
Neutro Abs: 2 K/uL (ref 1.4–7.7)
Neutrophils Relative %: 49.8 % (ref 43.0–77.0)
Platelets: 247 K/uL (ref 150.0–400.0)
RBC: 5.18 Mil/uL (ref 4.22–5.81)
RDW: 13.7 % (ref 11.5–15.5)
WBC: 4 K/uL (ref 4.0–10.5)

## 2023-08-27 LAB — COMPREHENSIVE METABOLIC PANEL WITH GFR
ALT: 20 U/L (ref 0–53)
AST: 15 U/L (ref 0–37)
Albumin: 4.1 g/dL (ref 3.5–5.2)
Alkaline Phosphatase: 58 U/L (ref 39–117)
BUN: 17 mg/dL (ref 6–23)
CO2: 31 meq/L (ref 19–32)
Calcium: 9.1 mg/dL (ref 8.4–10.5)
Chloride: 101 meq/L (ref 96–112)
Creatinine, Ser: 1.3 mg/dL (ref 0.40–1.50)
GFR: 62.97 mL/min (ref >60.00)
Glucose, Bld: 102 mg/dL — ABNORMAL HIGH (ref 70–99)
Potassium: 3.2 meq/L — ABNORMAL LOW (ref 3.5–5.1)
Sodium: 140 meq/L (ref 135–145)
Total Bilirubin: 0.5 mg/dL (ref 0.2–1.2)
Total Protein: 7.3 g/dL (ref 6.0–8.3)

## 2023-08-27 LAB — LIPID PANEL
Cholesterol: 170 mg/dL (ref 0–200)
HDL: 54.3 mg/dL (ref >39.00)
LDL Cholesterol: 105 mg/dL — ABNORMAL HIGH (ref 0–99)
NonHDL: 115.51
Total CHOL/HDL Ratio: 3
Triglycerides: 54 mg/dL (ref 0.0–149.0)
VLDL: 10.8 mg/dL (ref 0.0–40.0)

## 2023-08-27 LAB — PSA: PSA: 1.64 ng/mL (ref 0.10–4.00)

## 2023-08-27 MED ORDER — TEMAZEPAM 15 MG PO CAPS: ORAL_CAPSULE | ORAL | 2 refills | Status: AC

## 2023-08-27 NOTE — Progress Notes (Addendum)
 Established Patient Office Visit   Subjective:  Patient ID: Nicholas Hughes, male    DOB: 06-17-1970  Age: 53 y.o. MRN: [id number]  Chief Complaint  Patient presents with   Annual Exam    Pt Not fasting    HPI Encounter Diagnoses  Name Primary?   Healthcare maintenance Yes   Need for hepatitis vaccination    Essential hypertension    Pre-diabetes    OSA (obstructive sleep apnea)    Encounter for hepatitis C screening test for low risk patient    Hypokalemia    Screening for cholesterol level    Screening for prostate cancer    For physical today in follow-up of above.  Blood pressure is improved after compliance with medications.  Sleep medicine sent to dentist for an orthopedic appliance that he has been able to use for treatment of his sleep apnea.  He was having trouble with his CPAP machine.  He is planning on regular dental care.  Exercise has been difficult for him secondary to knee issues but his knee is improving and he is planning on returning to regular exercise.  Has been able to lose some weight.   Review of Systems  Constitutional: Negative.   HENT: Negative.    Eyes:  Negative for blurred vision, discharge and redness.  Respiratory: Negative.    Cardiovascular: Negative.   Gastrointestinal:  Negative for abdominal pain.  Genitourinary: Negative.   Musculoskeletal: Negative.  Negative for myalgias.  Skin:  Negative for rash.  Neurological:  Negative for tingling, loss of consciousness and weakness.  Endo/Heme/Allergies:  Negative for polydipsia.     Current Outpatient Medications:    CARTIA  XT 300 MG 24 hr capsule, Take 1 capsule by mouth once daily, Disp: 90 capsule, Rfl: 0   carvedilol  (COREG ) 12.5 MG tablet, TAKE 1 TABLET BY MOUTH TWICE DAILY WITH A MEAL, Disp: 180 tablet, Rfl: 0   empagliflozin  (JARDIANCE ) 25 MG TABS tablet, Take 1 tablet (25 mg total) by mouth daily., Disp: 90 tablet, Rfl: 1   hydrocortisone 2.5 % cream, Apply topically., Disp: , Rfl:     ketoconazole (NIZORAL) 2 % shampoo, Apply topically., Disp: , Rfl:    OVER THE COUNTER MEDICATION, daily at 6 (six) AM. Mervin Masters, Disp: , Rfl:    OVER THE COUNTER MEDICATION, Moringa powder, Disp: , Rfl:    triamterene -hydrochlorothiazide (MAXZIDE) 75-50 MG tablet, Take 1 tablet by mouth once daily, Disp: 90 tablet, Rfl: 2   vitamin C (ASCORBIC ACID) 250 MG tablet, Take 250 mg by mouth daily. Takes 2 for total of 500mg , Disp: , Rfl:    potassium chloride  SA (KLOR-CON  M) 20 MEQ tablet, Take 1 tablet (20 mEq total) by mouth daily., Disp: 90 tablet, Rfl: 3   temazepam  (RESTORIL ) 15 MG capsule, 1 or 2 caps for sleep as needed, Disp: 45 capsule, Rfl: 2   Objective:     BP 122/72 (BP Location: Right Arm, Patient Position: Sitting, Cuff Size: Normal)   Pulse 66   Temp 97.7 F (36.5 C) (Temporal)   Ht 6' (1.829 m)   Wt 254 lb (115.2 kg)   SpO2 98%   BMI 34.45 kg/m  BP Readings from Last 3 Encounters:  08/27/23 122/72  08/17/23 (!) 146/92  06/04/23 130/88   Wt Readings from Last 3 Encounters:  08/27/23 254 lb (115.2 kg)  08/17/23 259 lb (117.5 kg)  06/04/23 252 lb (114.3 kg)      Physical Exam Constitutional:  General: He is not in acute distress.    Appearance: Normal appearance. He is not ill-appearing, toxic-appearing or diaphoretic.  HENT:     Head: Normocephalic and atraumatic.     Right Ear: Tympanic membrane, ear canal and external ear normal.     Left Ear: Tympanic membrane, ear canal and external ear normal.     Mouth/Throat:     Mouth: Mucous membranes are moist.     Pharynx: Oropharynx is clear. No oropharyngeal exudate or posterior oropharyngeal erythema.  Eyes:     General: No scleral icterus.       Right eye: No discharge.        Left eye: No discharge.     Extraocular Movements: Extraocular movements intact.     Conjunctiva/sclera: Conjunctivae normal.     Pupils: Pupils are equal, round, and reactive to light.  Cardiovascular:     Rate and Rhythm:  Normal rate and regular rhythm.  Pulmonary:     Effort: Pulmonary effort is normal. No respiratory distress.     Breath sounds: Normal breath sounds.  Abdominal:     General: Bowel sounds are normal.     Tenderness: There is no abdominal tenderness. There is no guarding.     Hernia: There is no hernia in the left inguinal area or right inguinal area.  Genitourinary:    Penis: Circumcised. No hypospadias, erythema, tenderness, discharge, swelling or lesions.      Testes:        Right: Mass, tenderness or swelling not present. Right testis is descended.        Left: Mass, tenderness or swelling not present. Left testis is descended.     Epididymis:     Right: Not inflamed or enlarged.     Left: Not inflamed or enlarged.  Musculoskeletal:     Cervical back: No rigidity or tenderness.  Lymphadenopathy:     Lower Body: No right inguinal adenopathy. No left inguinal adenopathy.  Skin:    General: Skin is warm and dry.  Neurological:     Mental Status: He is alert and oriented to person, place, and time.  Psychiatric:        Mood and Affect: Mood normal.        Behavior: Behavior normal.      Results for orders placed or performed in visit on 08/27/23  CBC with Differential/Platelet  Result Value Ref Range   WBC 4.0 4.0 - 10.5 K/uL   RBC 5.18 4.22 - 5.81 Mil/uL   Hemoglobin 14.9 13.0 - 17.0 g/dL   HCT 55.5 60.9 - 47.9 %   MCV 85.7 78.0 - 100.0 fl   MCHC 33.7 30.0 - 36.0 g/dL   RDW 86.2 88.4 - 84.4 %   Platelets 247.0 150.0 - 400.0 K/uL   Neutrophils Relative % 49.8 43.0 - 77.0 %   Lymphocytes Relative 37.3 12.0 - 46.0 %   Monocytes Relative 11.7 3.0 - 12.0 %   Eosinophils Relative 0.6 0.0 - 5.0 %   Basophils Relative 0.6 0.0 - 3.0 %   Neutro Abs 2.0 1.4 - 7.7 K/uL   Lymphs Abs 1.5 0.7 - 4.0 K/uL   Monocytes Absolute 0.5 0.1 - 1.0 K/uL   Eosinophils Absolute 0.0 0.0 - 0.7 K/uL   Basophils Absolute 0.0 0.0 - 0.1 K/uL  Comprehensive metabolic panel with GFR  Result Value  Ref Range   Sodium 140 135 - 145 mEq/L   Potassium 3.2 (L) 3.5 - 5.1 mEq/L   Chloride  101 96 - 112 mEq/L   CO2 31 19 - 32 mEq/L   Glucose, Bld 102 (H) 70 - 99 mg/dL   BUN 17 6 - 23 mg/dL   Creatinine, Ser 8.69 0.40 - 1.50 mg/dL   Total Bilirubin 0.5 0.2 - 1.2 mg/dL   Alkaline Phosphatase 58 39 - 117 U/L   AST 15 0 - 37 U/L   ALT 20 0 - 53 U/L   Total Protein 7.3 6.0 - 8.3 g/dL   Albumin 4.1 3.5 - 5.2 g/dL   GFR 37.02 >39.99 mL/min   Calcium 9.1 8.4 - 10.5 mg/dL  Lipid panel  Result Value Ref Range   Cholesterol 170 0 - 200 mg/dL   Triglycerides 45.9 0.0 - 149.0 mg/dL   HDL 45.69 >60.99 mg/dL   VLDL 89.1 0.0 - 59.9 mg/dL   LDL Cholesterol 894 (H) 0 - 99 mg/dL   Total CHOL/HDL Ratio 3    NonHDL 115.51   PSA  Result Value Ref Range   PSA 1.64 0.10 - 4.00 ng/mL  Urinalysis, Routine w reflex microscopic  Result Value Ref Range   Color, Urine YELLOW Yellow;Lt. Yellow;Straw;Dark Yellow;Amber;Green;Red;Brown   APPearance CLEAR Clear;Turbid;Slightly Cloudy;Cloudy   Specific Gravity, Urine 1.015 1.000 - 1.030   pH 7.0 5.0 - 8.0   Total Protein, Urine NEGATIVE Negative   Urine Glucose >=1000 (A) Negative   Ketones, ur NEGATIVE Negative   Bilirubin Urine NEGATIVE Negative   Hgb urine dipstick NEGATIVE Negative   Urobilinogen, UA 0.2 0.0 - 1.0   Leukocytes,Ua NEGATIVE Negative   Nitrite NEGATIVE Negative   WBC, UA none seen 0-2/hpf   RBC / HPF none seen 0-2/hpf  Hepatitis C antibody  Result Value Ref Range   Hepatitis C Ab NON-REACTIVE NON-REACTIVE      The 10-year ASCVD risk score (Arnett DK, et al., 2019) is: 8.2%    Assessment & Plan:   Healthcare maintenance  Need for hepatitis vaccination -     Heplisav-B  (HepB-CPG) Vaccine  Essential hypertension -     CBC with Differential/Platelet -     Comprehensive metabolic panel with GFR -     Urinalysis, Routine w reflex microscopic -     Potassium Chloride  Crys ER; Take 1 tablet (20 mEq total) by mouth daily.   Dispense: 90 tablet; Refill: 3  Pre-diabetes -     Comprehensive metabolic panel with GFR  OSA (obstructive sleep apnea) -     Temazepam ; 1 or 2 caps for sleep as needed  Dispense: 45 capsule; Refill: 2  Encounter for hepatitis C screening test for low risk patient -     Hepatitis C antibody  Hypokalemia -     Comprehensive metabolic panel with GFR  Screening for cholesterol level -     Comprehensive metabolic panel with GFR -     Lipid panel  Screening for prostate cancer -     PSA    Return in about 6 months (around 02/27/2024), or Return in 2 months and in 6 months for 2nd and 3rd hep B vaccines..  Continue weight loss efforts.  Continue all medications as above.  Information was given on managing hypertension and hypokalemia.  Information given on the hepatitis B vaccine.  Information given on health maintenance and disease prevention.   Elsie Sim Lent, MD

## 2023-08-28 LAB — HEPATITIS C ANTIBODY: Hepatitis C Ab: NONREACTIVE

## 2023-08-30 ENCOUNTER — Ambulatory Visit: Payer: Self-pay | Admitting: Family Medicine

## 2023-08-30 MED ORDER — POTASSIUM CHLORIDE CRYS ER 20 MEQ PO TBCR: 20.0000 meq | EXTENDED_RELEASE_TABLET | Freq: Every day | ORAL | 3 refills | Status: AC

## 2023-08-30 NOTE — Addendum Note (Signed)
 Addended by: BERNETA ELSIE LABOR on: 08/30/2023 08:06 AM   Modules accepted: Orders

## 2023-09-01 NOTE — Progress Notes (Unsigned)
 Cardiology Office Note    Date:  09/03/2023  ID:  Nicholas Hughes, DOB 1970/05/12, MRN [id number] PCP:  Berneta Elsie Sayre, MD  Cardiologist:  Aleene Passe, MD (Inactive)  Electrophysiologist:  None   Chief Complaint: Follow up for PAF and HTN   History of Present Illness: .    Nicholas Hughes is a 53 y.o. male with visit-pertinent history of HTN, LVH, remote PAF and OSA. Patient has been followed by Dr. Passe for history of HTN.  Patient was last seen in clinic on 09/21/22, he had remained stable from a cardiac standpoint.   Patient presents today for 1 year follow-up.  He reports that he is doing well overall. He denies chest pain, shortness of breath, lower extremity edema, orthopnea or pnd. He denies any syncope or syncope.  Patient reports that he has not had any palpitations or feeling of atrial fibrillation in the last 10 years.  Notes he had a knee injury a few months ago with some minor swelling, has been improving. He is regularly exercising, he tries to exercise in his home gym three times a time, tolerates well. He also goes to his local gym to exercise before work.  ROS: .   Today he denies chest pain, shortness of breath, lower extremity edema, fatigue, palpitations, melena, hematuria, hemoptysis, diaphoresis, weakness, presyncope, syncope, orthopnea, and PND.  All other systems are reviewed and otherwise negative. Studies Reviewed: SABRA   EKG:  EKG is ordered today, personally reviewed, demonstrating  EKG Interpretation Date/Time:  Friday September 03 2023 07:47:52 EDT Ventricular Rate:  64 PR Interval:  174 QRS Duration:  90 QT Interval:  392 QTC Calculation: 404 R Axis:   -15  Text Interpretation: Normal sinus rhythm Minimal voltage criteria for LVH, may be normal variant ( R in aVL ) T wave inversion in inferior leads T wave inversion in anterolateral leads No significant change since last tracing Confirmed by Abagale Boulos [phone]) on 09/03/2023 8:14:18 AM   CV Studies:  Cardiac studies reviewed are outlined and summarized above. Otherwise please see EMR for full report.     Current Reported Medications:.    Current Meds  Medication Sig   CARTIA  XT 300 MG 24 hr capsule Take 1 capsule by mouth once daily   carvedilol  (COREG ) 12.5 MG tablet TAKE 1 TABLET BY MOUTH TWICE DAILY WITH A MEAL   empagliflozin  (JARDIANCE ) 25 MG TABS tablet Take 1 tablet (25 mg total) by mouth daily.   hydrocortisone 2.5 % cream Apply topically.   ketoconazole (NIZORAL) 2 % shampoo Apply topically.   OVER THE COUNTER MEDICATION daily at 6 (six) AM. [place]   OVER THE COUNTER MEDICATION [place]a powder   potassium chloride  SA (KLOR-CON  M) 20 MEQ tablet Take 1 tablet (20 mEq total) by mouth daily.   temazepam  (RESTORIL ) 15 MG capsule 1 or 2 caps for sleep as needed   triamterene -hydrochlorothiazide (MAXZIDE) 75-50 MG tablet Take 1 tablet by mouth once daily   vitamin C (ASCORBIC ACID) 250 MG tablet Take 250 mg by mouth daily. Takes 2 for total of 500mg    Physical Exam:    VS:  BP 126/72   Pulse 64   Ht 6' (1.829 m)   Wt 256 lb (116.1 kg)   SpO2 96%   BMI 34.72 kg/m    Wt Readings from Last 3 Encounters:  09/03/23 256 lb (116.1 kg)  08/27/23 254 lb (115.2 kg)  08/17/23 259 lb (117.5 kg)    GEN: Well nourished,  well developed in no acute distress NECK: No JVD; No carotid bruits CARDIAC: RRR, no murmurs, rubs, gallops RESPIRATORY:  Clear to auscultation without rales, wheezing or rhonchi  ABDOMEN: Soft, non-tender, non-distended EXTREMITIES:  No edema; No acute deformity     Asessement and Plan:.    HTN: Blood pressure today 126/72.  Patient reports he is now being followed by primary care for this.  Continue carvedilol  12.5 mg twice daily, diltiazem  300 mg daily, triamterene  hydrochlorothiazide 70-50 mg daily. Patient has previously been unable to tolerate Coreg  or higher doses of diltiazem .  PAF: Patient with prior episodes of atrial fibrillation, patient reports this  occurred over 10 years ago.  Patient reports that he has not had any palpitations or feeling of atrial fibrillation in the last 10 years, with this and low CHADS2 Vascor he has not been started on anticoagulation. Patient will notify the office if he has any palpitations or feeling of atrial fibrillation.  Continue aspirin .  OSA: Patient has been followed by sleep medicine, he was sent to dentist for an orthopedic appliance as he has difficulty with CPAP machines.    Disposition: After discussion with patient he prefers to follow-up as needed as his PCP is now following his hypertension.  He will notify the office if he has any palpitations or feeling of atrial fibrillation.  Signed, Brance Dartt D Coe Angelos, NP

## 2023-09-03 ENCOUNTER — Ambulatory Visit: Attending: Cardiology | Admitting: Cardiology

## 2023-09-03 ENCOUNTER — Encounter: Payer: Self-pay | Admitting: Cardiology

## 2023-09-03 VITALS — BP 126/72 | HR 64 | Ht 72.0 in | Wt 256.0 lb

## 2023-09-03 DIAGNOSIS — I10 Essential (primary) hypertension: Secondary | ICD-10-CM

## 2023-09-03 DIAGNOSIS — G4733 Obstructive sleep apnea (adult) (pediatric): Secondary | ICD-10-CM | POA: Diagnosis not present

## 2023-09-03 DIAGNOSIS — I4891 Unspecified atrial fibrillation: Secondary | ICD-10-CM

## 2023-09-03 NOTE — Patient Instructions (Signed)
 Medication Instructions:  No changes *If you need a refill on your cardiac medications before your next appointment, please call your pharmacy*  Lab Work: No labs  Testing/Procedures: No testing  Follow-Up: At [facility], you and your health needs are our priority.  As part of our continuing mission to provide you with exceptional heart care, our providers are all part of one team.  This team includes your primary Cardiologist (physician) and Advanced Practice Providers or APPs (Physician Assistants and Nurse Practitioners) who all work together to provide you with the care you need, when you need it.  Your next appointment:   As needed  We recommend signing up for the patient portal called MyChart.  Sign up information is provided on this After Visit Summary.  MyChart is used to connect with patients for Virtual Visits (Telemedicine).  Patients are able to view lab/test results, encounter notes, upcoming appointments, etc.  Non-urgent messages can be sent to your provider as well.   To learn more about what you can do with MyChart, go to [email or web address].

## 2023-09-14 ENCOUNTER — Other Ambulatory Visit: Payer: Self-pay | Admitting: Family Medicine

## 2023-09-14 DIAGNOSIS — I4891 Unspecified atrial fibrillation: Secondary | ICD-10-CM

## 2023-09-14 DIAGNOSIS — I10 Essential (primary) hypertension: Secondary | ICD-10-CM

## 2023-10-26 ENCOUNTER — Other Ambulatory Visit: Payer: Self-pay | Admitting: Family Medicine

## 2023-10-26 DIAGNOSIS — R7303 Prediabetes: Secondary | ICD-10-CM

## 2023-10-26 MED ORDER — EMPAGLIFLOZIN 25 MG PO TABS: 25.0000 mg | ORAL_TABLET | Freq: Every day | ORAL | 1 refills | Status: AC

## 2023-10-26 NOTE — Addendum Note (Signed)
 Addended by: BERNETA ELSIE LABOR on: 10/26/2023 10:42 AM   Modules accepted: Orders

## 2023-12-08 ENCOUNTER — Other Ambulatory Visit: Payer: Self-pay | Admitting: Family Medicine

## 2023-12-08 DIAGNOSIS — I10 Essential (primary) hypertension: Secondary | ICD-10-CM

## 2023-12-08 DIAGNOSIS — I4891 Unspecified atrial fibrillation: Secondary | ICD-10-CM

## 2023-12-10 ENCOUNTER — Other Ambulatory Visit: Payer: Self-pay | Admitting: Family Medicine

## 2023-12-10 DIAGNOSIS — I10 Essential (primary) hypertension: Secondary | ICD-10-CM

## 2024-02-04 ENCOUNTER — Ambulatory Visit: Admitting: Family

## 2024-02-04 ENCOUNTER — Encounter: Payer: Self-pay | Admitting: Family

## 2024-02-04 ENCOUNTER — Ambulatory Visit

## 2024-02-04 VITALS — BP 138/82 | HR 68 | Ht 72.0 in | Wt 260.8 lb

## 2024-02-04 DIAGNOSIS — M545 Low back pain, unspecified: Secondary | ICD-10-CM | POA: Diagnosis not present

## 2024-02-04 MED ORDER — MELOXICAM 15 MG PO TABS: 15.0000 mg | ORAL_TABLET | Freq: Every day | ORAL | 0 refills | Status: AC

## 2024-02-04 MED ORDER — CYCLOBENZAPRINE HCL 10 MG PO TABS: 10.0000 mg | ORAL_TABLET | Freq: Three times a day (TID) | ORAL | 0 refills | Status: AC | PRN

## 2024-02-04 NOTE — Progress Notes (Unsigned)
 "  Acute Office Visit  Subjective:     Patient ID: Nicholas Hughes, male    DOB: Jun 21, 1970, 54 y.o.   MRN: [id number]  Chief Complaint  Patient presents with   Back Pain    Right side back pain     HPI Patient is in today with c/o lower right sided back pain x 4-5 days that he rates a 6/10. Pain is worse with certain movements and lying down flat. He admits to working out at the gym and heavy lifting although he does not recall a specific injury. Denies any urinary concerns.   Review of Systems  Respiratory: Negative.    Cardiovascular: Negative.   Musculoskeletal:  Positive for back pain.  Neurological: Negative.   All other systems reviewed and are negative.  Past Medical History:  Diagnosis Date   Arthritis    Borderline diabetes    CKD (chronic kidney disease) stage 2, GFR 60-89 ml/min    Diabetes mellitus without complication (HCC)    ED (erectile dysfunction)    GERD (gastroesophageal reflux disease)    Hypertension    LVH (left ventricular hypertrophy)    OSA (obstructive sleep apnea) 07/14/2014   Moderate OSA with AHI 20/hr   Renal insufficiency    Sleep apnea    Transient atrial fibrillation or flutter     Social History   Socioeconomic History   Marital status: Single    Spouse name: Not on file   Number of children: Not on file   Years of education: Not on file   Highest education level: Not on file  Occupational History   Not on file  Tobacco Use   Smoking status: Never   Smokeless tobacco: Never  Vaping Use   Vaping status: Never Used  Substance and Sexual Activity   Alcohol use: Yes    Comment: occasionally   Drug use: No   Sexual activity: Yes  Other Topics Concern   Not on file  Social History Narrative   Right handed   One story home   Drinks no caffeine   Social Drivers of Health   Tobacco Use: Low Risk (02/04/2024)   Patient History    Smoking Tobacco Use: Never    Smokeless Tobacco Use: Never     Passive Exposure: Not on file  Financial Resource Strain: Not on file  Food Insecurity: Not on file  Transportation Needs: Not on file  Physical Activity: Not on file  Stress: Not on file  Social Connections: Unknown (06/10/2021)   Received from [facility]   Social Network    Social Network: Not on file  Intimate Partner Violence: Unknown (05/02/2021)   Received from [facility]   HITS    Physically Hurt: Not on file    Insult or Talk Down To: Not on file    Threaten Physical Harm: Not on file    Scream or Curse: Not on file  Depression (PHQ2-9): Low Risk (08/27/2023)   Depression (PHQ2-9)    PHQ-2 Score: 4  Alcohol Screen: Not on file  Housing: Not on file  Utilities: Not on file  Health Literacy: Not on file    Past Surgical History:  Procedure Laterality Date   Arm surgery  Right    TRANSTHORACIC ECHOCARDIOGRAM  05/07/2009   EF 60-65%    Family History  Problem Relation Age of Onset   Hypertension Father    Coronary artery disease Father    Colon polyps Neg Hx    Colon cancer Neg  Hx    Esophageal cancer Neg Hx    Rectal cancer Neg Hx    Stomach cancer Neg Hx     Allergies[1]  Medications Ordered Prior to Encounter[2]  BP 138/82 (BP Location: Left Arm, Patient Position: Sitting, Cuff Size: Normal)   Pulse 68   Ht 6' (1.829 m)   Wt 260 lb 12.8 oz (118.3 kg)   SpO2 97%   BMI 35.37 kg/m chart      Objective:    BP 138/82 (BP Location: Left Arm, Patient Position: Sitting, Cuff Size: Normal)   Pulse 68   Ht 6' (1.829 m)   Wt 260 lb 12.8 oz (118.3 kg)   SpO2 97%   BMI 35.37 kg/m    Physical Exam Vitals and nursing note reviewed.  Constitutional:      Appearance: Normal appearance. He is obese.  Cardiovascular:     Rate and Rhythm: Normal rate and regular rhythm.     Pulses: Normal pulses.     Heart sounds: Normal heart sounds.  Pulmonary:     Effort: Pulmonary effort is normal.     Breath sounds: Normal breath sounds.   Musculoskeletal:     Cervical back: Normal range of motion and neck supple.     Right lower leg: No edema.     Left lower leg: No edema.     Comments: Pain elicited to with rotation of the spine, and laying flat. Positive SLR on the right. No obvious deformity. Np swelling  Skin:    General: Skin is warm and dry.  Neurological:     General: No focal deficit present.     Mental Status: He is alert and oriented to person, place, and time. Mental status is at baseline.  Psychiatric:        Mood and Affect: Mood normal.        Behavior: Behavior normal.    No results found for any visits on 02/04/24.      Assessment & Plan:   Problem List Items Addressed This Visit   None Visit Diagnoses       Acute right-sided low back pain, unspecified whether sciatica present    -  Primary   Relevant Medications   cyclobenzaprine  (FLEXERIL ) 10 MG tablet   meloxicam  (MOBIC ) 15 MG tablet   Other Relevant Orders   DG Lumbar Spine Complete       Meds ordered this encounter  Medications   cyclobenzaprine  (FLEXERIL ) 10 MG tablet    Sig: Take 1 tablet (10 mg total) by mouth 3 (three) times daily as needed for muscle spasms.    Dispense:  30 tablet    Refill:  0   meloxicam  (MOBIC ) 15 MG tablet    Sig: Take 1 tablet (15 mg total) by mouth daily.    Dispense:  30 tablet    Refill:  0   Call the office if symptoms worsen or persist. Follow up pending xray and sooner as needed No follow-ups on file.  Margart Zemanek B Ariani Seier, FNP       [1] No Known Allergies [2] Current Outpatient Medications on File Prior to Visit  Medication Sig Dispense Refill   CARTIA  XT 300 MG 24 hr capsule Take 1 capsule by mouth once daily 90 capsule 0   carvedilol  (COREG ) 12.5 MG tablet TAKE 1 TABLET BY MOUTH TWICE DAILY WITH A MEAL 180 tablet 0   empagliflozin  (JARDIANCE ) 25 MG TABS tablet Take 1 tablet (25 mg total) by mouth daily. 90  tablet 1   hydrocortisone 2.5 % cream Apply topically.     ketoconazole  (NIZORAL) 2 % shampoo Apply topically.     OVER THE COUNTER MEDICATION daily at 6 (six) AM. [place]     OVER THE COUNTER MEDICATION [place] powder     potassium chloride  SA (KLOR-CON  M) 20 MEQ tablet Take 1 tablet (20 mEq total) by mouth daily. 90 tablet 3   temazepam  (RESTORIL ) 15 MG capsule 1 or 2 caps for sleep as needed 45 capsule 2   triamterene -hydrochlorothiazide (MAXZIDE) 75-50 MG tablet Take 1 tablet by mouth once daily 90 tablet 2   vitamin C (ASCORBIC ACID) 250 MG tablet Take 250 mg by mouth daily. Takes 2 for total of 500mg      No current facility-administered medications on file prior to visit.  "

## 2024-02-10 ENCOUNTER — Ambulatory Visit: Payer: BC Managed Care – PPO | Admitting: Internal Medicine

## 2024-02-11 ENCOUNTER — Ambulatory Visit: Payer: Self-pay | Admitting: Family

## 2024-03-03 ENCOUNTER — Other Ambulatory Visit: Payer: Self-pay | Admitting: Family Medicine

## 2024-03-03 DIAGNOSIS — I4891 Unspecified atrial fibrillation: Secondary | ICD-10-CM

## 2024-03-03 DIAGNOSIS — I10 Essential (primary) hypertension: Secondary | ICD-10-CM

## 2024-03-03 NOTE — Telephone Encounter (Signed)
 Due for an appointment .Dm/cma

## 2024-09-01 ENCOUNTER — Encounter: Admitting: Family Medicine
